# Patient Record
Sex: Male | Born: 1955 | Race: Black or African American | Hispanic: No | Marital: Single | Smoking: Former smoker
Health system: Southern US, Community
[De-identification: ages and names within clinical notes are randomized; demographics above are authoritative.]

## PROBLEM LIST (undated history)

## (undated) DIAGNOSIS — I10 Essential (primary) hypertension: Secondary | ICD-10-CM

## (undated) DIAGNOSIS — I639 Cerebral infarction, unspecified: Secondary | ICD-10-CM

## (undated) HISTORY — DX: Essential (primary) hypertension: I10

## (undated) HISTORY — DX: Cerebral infarction, unspecified: I63.9

---

## 2020-09-07 DIAGNOSIS — Z87891 Personal history of nicotine dependence: Secondary | ICD-10-CM | POA: Diagnosis not present

## 2020-09-07 DIAGNOSIS — I739 Peripheral vascular disease, unspecified: Secondary | ICD-10-CM | POA: Diagnosis not present

## 2020-09-07 DIAGNOSIS — E669 Obesity, unspecified: Secondary | ICD-10-CM | POA: Diagnosis not present

## 2020-09-07 DIAGNOSIS — Z6834 Body mass index (BMI) 34.0-34.9, adult: Secondary | ICD-10-CM | POA: Diagnosis not present

## 2020-09-07 DIAGNOSIS — R030 Elevated blood-pressure reading, without diagnosis of hypertension: Secondary | ICD-10-CM | POA: Diagnosis not present

## 2020-09-07 DIAGNOSIS — K219 Gastro-esophageal reflux disease without esophagitis: Secondary | ICD-10-CM | POA: Diagnosis not present

## 2020-10-08 DIAGNOSIS — E668 Other obesity: Secondary | ICD-10-CM | POA: Diagnosis not present

## 2020-10-08 DIAGNOSIS — R9431 Abnormal electrocardiogram [ECG] [EKG]: Secondary | ICD-10-CM | POA: Diagnosis not present

## 2020-10-08 DIAGNOSIS — I10 Essential (primary) hypertension: Secondary | ICD-10-CM | POA: Diagnosis not present

## 2020-10-08 DIAGNOSIS — I739 Peripheral vascular disease, unspecified: Secondary | ICD-10-CM | POA: Diagnosis not present

## 2020-10-16 DIAGNOSIS — I739 Peripheral vascular disease, unspecified: Secondary | ICD-10-CM | POA: Diagnosis not present

## 2020-10-19 DIAGNOSIS — I10 Essential (primary) hypertension: Secondary | ICD-10-CM | POA: Diagnosis not present

## 2020-12-03 ENCOUNTER — Emergency Department: Payer: Medicare HMO

## 2020-12-03 ENCOUNTER — Inpatient Hospital Stay (HOSPITAL_COMMUNITY): Payer: Medicare HMO

## 2020-12-03 ENCOUNTER — Inpatient Hospital Stay (HOSPITAL_COMMUNITY): Payer: Medicare HMO | Admitting: Anesthesiology

## 2020-12-03 ENCOUNTER — Other Ambulatory Visit: Payer: Self-pay

## 2020-12-03 ENCOUNTER — Ambulatory Visit (HOSPITAL_COMMUNITY)
Admission: RE | Admit: 2020-12-03 | Discharge: 2020-12-03 | Disposition: A | Discharge status: Home / Self Care | Payer: Medicare HMO | Source: Ambulatory Visit | Attending: Interventional Radiology | Admitting: Interventional Radiology

## 2020-12-03 ENCOUNTER — Encounter (HOSPITAL_COMMUNITY)
Admission: AD | Disposition: A | Discharge status: Home / Self Care | Payer: Self-pay | Source: Other Acute Inpatient Hospital | Attending: Neurology

## 2020-12-03 ENCOUNTER — Emergency Department
Admission: EM | Admit: 2020-12-03 | Discharge: 2020-12-03 | Disposition: A | Discharge status: Other Acute Inpatient Hospital | Payer: Medicare HMO | Attending: Emergency Medicine | Admitting: Emergency Medicine

## 2020-12-03 ENCOUNTER — Encounter: Payer: Self-pay | Admitting: Radiology

## 2020-12-03 ENCOUNTER — Inpatient Hospital Stay (HOSPITAL_COMMUNITY)
Admission: AD | Admit: 2020-12-03 | Discharge: 2020-12-06 | DRG: 023 | Disposition: A | Discharge status: Home / Self Care | Payer: Medicare HMO | Source: Other Acute Inpatient Hospital | Attending: Neurology | Admitting: Neurology

## 2020-12-03 ENCOUNTER — Other Ambulatory Visit (HOSPITAL_COMMUNITY): Payer: Self-pay | Admitting: Interventional Radiology

## 2020-12-03 DIAGNOSIS — G8191 Hemiplegia, unspecified affecting right dominant side: Secondary | ICD-10-CM | POA: Diagnosis not present

## 2020-12-03 DIAGNOSIS — R739 Hyperglycemia, unspecified: Secondary | ICD-10-CM | POA: Diagnosis present

## 2020-12-03 DIAGNOSIS — Z978 Presence of other specified devices: Secondary | ICD-10-CM | POA: Diagnosis not present

## 2020-12-03 DIAGNOSIS — J9601 Acute respiratory failure with hypoxia: Secondary | ICD-10-CM | POA: Diagnosis not present

## 2020-12-03 DIAGNOSIS — E781 Pure hyperglyceridemia: Secondary | ICD-10-CM | POA: Diagnosis not present

## 2020-12-03 DIAGNOSIS — Z6830 Body mass index (BMI) 30.0-30.9, adult: Secondary | ICD-10-CM

## 2020-12-03 DIAGNOSIS — I679 Cerebrovascular disease, unspecified: Secondary | ICD-10-CM | POA: Diagnosis not present

## 2020-12-03 DIAGNOSIS — Z79899 Other long term (current) drug therapy: Secondary | ICD-10-CM | POA: Diagnosis not present

## 2020-12-03 DIAGNOSIS — R531 Weakness: Secondary | ICD-10-CM | POA: Diagnosis present

## 2020-12-03 DIAGNOSIS — Z9282 Status post administration of tPA (rtPA) in a different facility within the last 24 hours prior to admission to current facility: Secondary | ICD-10-CM | POA: Diagnosis not present

## 2020-12-03 DIAGNOSIS — J690 Pneumonitis due to inhalation of food and vomit: Secondary | ICD-10-CM | POA: Diagnosis not present

## 2020-12-03 DIAGNOSIS — Z8673 Personal history of transient ischemic attack (TIA), and cerebral infarction without residual deficits: Secondary | ICD-10-CM

## 2020-12-03 DIAGNOSIS — E876 Hypokalemia: Secondary | ICD-10-CM | POA: Diagnosis present

## 2020-12-03 DIAGNOSIS — R4701 Aphasia: Secondary | ICD-10-CM | POA: Diagnosis present

## 2020-12-03 DIAGNOSIS — I6389 Other cerebral infarction: Secondary | ICD-10-CM

## 2020-12-03 DIAGNOSIS — I639 Cerebral infarction, unspecified: Secondary | ICD-10-CM | POA: Diagnosis not present

## 2020-12-03 DIAGNOSIS — Z20822 Contact with and (suspected) exposure to covid-19: Secondary | ICD-10-CM | POA: Insufficient documentation

## 2020-12-03 DIAGNOSIS — E669 Obesity, unspecified: Secondary | ICD-10-CM | POA: Diagnosis present

## 2020-12-03 DIAGNOSIS — R451 Restlessness and agitation: Secondary | ICD-10-CM | POA: Diagnosis present

## 2020-12-03 DIAGNOSIS — I6501 Occlusion and stenosis of right vertebral artery: Secondary | ICD-10-CM | POA: Diagnosis not present

## 2020-12-03 DIAGNOSIS — Z9889 Other specified postprocedural states: Secondary | ICD-10-CM | POA: Diagnosis not present

## 2020-12-03 DIAGNOSIS — R29736 NIHSS score 36: Secondary | ICD-10-CM | POA: Diagnosis present

## 2020-12-03 DIAGNOSIS — R000 Tachycardia, unspecified: Secondary | ICD-10-CM | POA: Diagnosis not present

## 2020-12-03 DIAGNOSIS — R0603 Acute respiratory distress: Secondary | ICD-10-CM | POA: Diagnosis present

## 2020-12-03 DIAGNOSIS — R4020 Unspecified coma: Secondary | ICD-10-CM | POA: Diagnosis present

## 2020-12-03 DIAGNOSIS — Z87891 Personal history of nicotine dependence: Secondary | ICD-10-CM | POA: Diagnosis not present

## 2020-12-03 DIAGNOSIS — R29810 Facial weakness: Secondary | ICD-10-CM | POA: Diagnosis not present

## 2020-12-03 DIAGNOSIS — R471 Dysarthria and anarthria: Secondary | ICD-10-CM | POA: Diagnosis present

## 2020-12-03 DIAGNOSIS — Z4659 Encounter for fitting and adjustment of other gastrointestinal appliance and device: Secondary | ICD-10-CM

## 2020-12-03 DIAGNOSIS — I651 Occlusion and stenosis of basilar artery: Secondary | ICD-10-CM | POA: Diagnosis not present

## 2020-12-03 DIAGNOSIS — I63111 Cerebral infarction due to embolism of right vertebral artery: Secondary | ICD-10-CM | POA: Diagnosis not present

## 2020-12-03 DIAGNOSIS — R42 Dizziness and giddiness: Secondary | ICD-10-CM | POA: Diagnosis not present

## 2020-12-03 DIAGNOSIS — Z4682 Encounter for fitting and adjustment of non-vascular catheter: Secondary | ICD-10-CM | POA: Diagnosis not present

## 2020-12-03 DIAGNOSIS — R1310 Dysphagia, unspecified: Secondary | ICD-10-CM | POA: Diagnosis present

## 2020-12-03 DIAGNOSIS — I6322 Cerebral infarction due to unspecified occlusion or stenosis of basilar arteries: Secondary | ICD-10-CM | POA: Diagnosis not present

## 2020-12-03 DIAGNOSIS — I10 Essential (primary) hypertension: Secondary | ICD-10-CM | POA: Diagnosis not present

## 2020-12-03 DIAGNOSIS — I6312 Cerebral infarction due to embolism of basilar artery: Principal | ICD-10-CM | POA: Diagnosis present

## 2020-12-03 DIAGNOSIS — I6302 Cerebral infarction due to thrombosis of basilar artery: Secondary | ICD-10-CM | POA: Diagnosis not present

## 2020-12-03 DIAGNOSIS — R1312 Dysphagia, oropharyngeal phase: Secondary | ICD-10-CM | POA: Diagnosis not present

## 2020-12-03 DIAGNOSIS — E785 Hyperlipidemia, unspecified: Secondary | ICD-10-CM | POA: Diagnosis present

## 2020-12-03 DIAGNOSIS — E1159 Type 2 diabetes mellitus with other circulatory complications: Secondary | ICD-10-CM | POA: Diagnosis not present

## 2020-12-03 DIAGNOSIS — I6340 Cerebral infarction due to embolism of unspecified cerebral artery: Secondary | ICD-10-CM | POA: Diagnosis present

## 2020-12-03 DIAGNOSIS — I6523 Occlusion and stenosis of bilateral carotid arteries: Secondary | ICD-10-CM | POA: Diagnosis not present

## 2020-12-03 DIAGNOSIS — J9690 Respiratory failure, unspecified, unspecified whether with hypoxia or hypercapnia: Secondary | ICD-10-CM | POA: Diagnosis not present

## 2020-12-03 DIAGNOSIS — I161 Hypertensive emergency: Secondary | ICD-10-CM | POA: Diagnosis not present

## 2020-12-03 DIAGNOSIS — R4781 Slurred speech: Secondary | ICD-10-CM | POA: Diagnosis not present

## 2020-12-03 DIAGNOSIS — I160 Hypertensive urgency: Secondary | ICD-10-CM | POA: Diagnosis not present

## 2020-12-03 DIAGNOSIS — I152 Hypertension secondary to endocrine disorders: Secondary | ICD-10-CM | POA: Diagnosis not present

## 2020-12-03 DIAGNOSIS — E7800 Pure hypercholesterolemia, unspecified: Secondary | ICD-10-CM | POA: Diagnosis not present

## 2020-12-03 HISTORY — PX: IR INTRA CRAN STENT: IMG2345

## 2020-12-03 HISTORY — PX: IR PERCUTANEOUS ART THROMBECTOMY/INFUSION INTRACRANIAL INC DIAG ANGIO: IMG6087

## 2020-12-03 HISTORY — PX: RADIOLOGY WITH ANESTHESIA: SHX6223

## 2020-12-03 HISTORY — PX: IR US GUIDE VASC ACCESS RIGHT: IMG2390

## 2020-12-03 HISTORY — PX: IR CT HEAD LTD: IMG2386

## 2020-12-03 LAB — CBC
HCT: 40.9 % (ref 39.0–52.0)
Hemoglobin: 13.4 g/dL (ref 13.0–17.0)
MCH: 28.5 pg (ref 26.0–34.0)
MCHC: 32.8 g/dL (ref 30.0–36.0)
MCV: 86.8 fL (ref 80.0–100.0)
Platelets: 298 10*3/uL (ref 150–400)
RBC: 4.71 MIL/uL (ref 4.22–5.81)
RDW: 13.8 % (ref 11.5–15.5)
WBC: 13.5 10*3/uL — ABNORMAL HIGH (ref 4.0–10.5)
nRBC: 0 % (ref 0.0–0.2)

## 2020-12-03 LAB — COMPREHENSIVE METABOLIC PANEL
ALT: 25 U/L (ref 0–44)
AST: 30 U/L (ref 15–41)
Albumin: 3.9 g/dL (ref 3.5–5.0)
Alkaline Phosphatase: 72 U/L (ref 38–126)
Anion gap: 12 (ref 5–15)
BUN: 13 mg/dL (ref 8–23)
CO2: 26 mmol/L (ref 22–32)
Calcium: 8.9 mg/dL (ref 8.9–10.3)
Chloride: 95 mmol/L — ABNORMAL LOW (ref 98–111)
Creatinine, Ser: 1.1 mg/dL (ref 0.61–1.24)
GFR, Estimated: >60 mL/min (ref >60)
Glucose, Bld: 203 mg/dL — ABNORMAL HIGH (ref 70–99)
Potassium: 2.6 mmol/L — CL (ref 3.5–5.1)
Sodium: 133 mmol/L — ABNORMAL LOW (ref 135–145)
Total Bilirubin: 0.6 mg/dL (ref 0.3–1.2)
Total Protein: 7.3 g/dL (ref 6.5–8.1)

## 2020-12-03 LAB — PROTIME-INR
INR: 1 (ref 0.8–1.2)
INR: 1 (ref 0.8–1.2)
Prothrombin Time: 13 seconds (ref 11.4–15.2)
Prothrombin Time: 13.4 seconds (ref 11.4–15.2)

## 2020-12-03 LAB — URINALYSIS, COMPLETE (UACMP) WITH MICROSCOPIC
Bacteria, UA: NONE SEEN
Bilirubin Urine: NEGATIVE
Glucose, UA: NEGATIVE mg/dL
Hgb urine dipstick: NEGATIVE
Ketones, ur: NEGATIVE mg/dL
Leukocytes,Ua: NEGATIVE
Nitrite: NEGATIVE
Protein, ur: NEGATIVE mg/dL
Specific Gravity, Urine: 1.041 — ABNORMAL HIGH (ref 1.005–1.030)
pH: 8 (ref 5.0–8.0)

## 2020-12-03 LAB — POCT I-STAT 7, (LYTES, BLD GAS, ICA,H+H)
Acid-Base Excess: 1 mmol/L (ref 0.0–2.0)
Bicarbonate: 25.7 mmol/L (ref 20.0–28.0)
Calcium, Ion: 1.16 mmol/L (ref 1.15–1.40)
HCT: 41 % (ref 39.0–52.0)
Hemoglobin: 13.9 g/dL (ref 13.0–17.0)
O2 Saturation: 89 %
Patient temperature: 97
Potassium: 3.1 mmol/L — ABNORMAL LOW (ref 3.5–5.1)
Sodium: 135 mmol/L (ref 135–145)
TCO2: 27 mmol/L (ref 22–32)
pCO2 arterial: 38.8 mmHg (ref 32.0–48.0)
pH, Arterial: 7.426 (ref 7.350–7.450)
pO2, Arterial: 53 mmHg — ABNORMAL LOW (ref 83.0–108.0)

## 2020-12-03 LAB — RESP PANEL BY RT-PCR (FLU A&B, COVID) ARPGX2
Influenza A by PCR: NEGATIVE
Influenza B by PCR: NEGATIVE
SARS Coronavirus 2 by RT PCR: NEGATIVE

## 2020-12-03 LAB — DIFFERENTIAL
Abs Immature Granulocytes: 0.04 10*3/uL (ref 0.00–0.07)
Basophils Absolute: 0.1 10*3/uL (ref 0.0–0.1)
Basophils Relative: 1 %
Eosinophils Absolute: 0.1 10*3/uL (ref 0.0–0.5)
Eosinophils Relative: 0 %
Immature Granulocytes: 0 %
Lymphocytes Relative: 28 %
Lymphs Abs: 3.7 10*3/uL (ref 0.7–4.0)
Monocytes Absolute: 0.8 10*3/uL (ref 0.1–1.0)
Monocytes Relative: 6 %
Neutro Abs: 8.8 10*3/uL — ABNORMAL HIGH (ref 1.7–7.7)
Neutrophils Relative %: 65 %

## 2020-12-03 LAB — MRSA NEXT GEN BY PCR, NASAL: MRSA by PCR Next Gen: NOT DETECTED

## 2020-12-03 LAB — GLUCOSE, CAPILLARY
Glucose-Capillary: 121 mg/dL — ABNORMAL HIGH (ref 70–99)
Glucose-Capillary: 197 mg/dL — ABNORMAL HIGH (ref 70–99)
Glucose-Capillary: 117 mg/dL — ABNORMAL HIGH (ref 70–99)

## 2020-12-03 LAB — BASIC METABOLIC PANEL
Anion gap: 12 (ref 5–15)
BUN: 11 mg/dL (ref 8–23)
CO2: 25 mmol/L (ref 22–32)
Calcium: 8.5 mg/dL — ABNORMAL LOW (ref 8.9–10.3)
Chloride: 95 mmol/L — ABNORMAL LOW (ref 98–111)
Creatinine, Ser: 0.79 mg/dL (ref 0.61–1.24)
GFR, Estimated: >60 mL/min (ref >60)
Glucose, Bld: 115 mg/dL — ABNORMAL HIGH (ref 70–99)
Potassium: 3.9 mmol/L (ref 3.5–5.1)
Sodium: 132 mmol/L — ABNORMAL LOW (ref 135–145)

## 2020-12-03 LAB — RAPID URINE DRUG SCREEN, HOSP PERFORMED
Amphetamines: NOT DETECTED
Barbiturates: NOT DETECTED
Benzodiazepines: NOT DETECTED
Cocaine: NOT DETECTED
Opiates: NOT DETECTED
Tetrahydrocannabinol: POSITIVE — AB

## 2020-12-03 LAB — PHOSPHORUS: Phosphorus: 2.6 mg/dL (ref 2.5–4.6)

## 2020-12-03 LAB — APTT
aPTT: 20 seconds — ABNORMAL LOW (ref 24–36)
aPTT: 29 seconds (ref 24–36)

## 2020-12-03 LAB — MAGNESIUM: Magnesium: 2.1 mg/dL (ref 1.7–2.4)

## 2020-12-03 LAB — HIV ANTIBODY (ROUTINE TESTING W REFLEX): HIV Screen 4th Generation wRfx: NONREACTIVE

## 2020-12-03 LAB — CBG MONITORING, ED: Glucose-Capillary: 233 mg/dL — ABNORMAL HIGH (ref 70–99)

## 2020-12-03 LAB — ETHANOL: Alcohol, Ethyl (B): <10 mg/dL (ref <10)

## 2020-12-03 SURGERY — IR WITH ANESTHESIA
Anesthesia: General

## 2020-12-03 MED ORDER — CANGRELOR TETRASODIUM 50 MG IV SOLR
INTRAVENOUS | Status: AC
  Filled 2020-12-03: qty 50

## 2020-12-03 MED ORDER — TICAGRELOR 90 MG PO TABS: 90.0000 mg | ORAL_TABLET | Freq: Two times a day (BID) | ORAL | Status: DC

## 2020-12-03 MED ORDER — IOHEXOL 350 MG/ML SOLN
75.0000 mL | Freq: Once | INTRAVENOUS | Status: AC | PRN
  Administered 2020-12-03: 75 mL via INTRAVENOUS

## 2020-12-03 MED ORDER — CLEVIDIPINE BUTYRATE 0.5 MG/ML IV EMUL
INTRAVENOUS | Status: AC
  Administered 2020-12-03: 16 mg/h via INTRAVENOUS
  Filled 2020-12-03: qty 50

## 2020-12-03 MED ORDER — MIDAZOLAM HCL 2 MG/2ML IJ SOLN
INTRAMUSCULAR | Status: AC
  Administered 2020-12-03: 2 mg via INTRAVENOUS
  Filled 2020-12-03: qty 2

## 2020-12-03 MED ORDER — MAGNESIUM SULFATE 2 GM/50ML IV SOLN
2.0000 g | Freq: Once | INTRAVENOUS | Status: AC
  Administered 2020-12-03: 2 g via INTRAVENOUS
  Filled 2020-12-03: qty 50

## 2020-12-03 MED ORDER — VECURONIUM BROMIDE 10 MG IV SOLR: 10.0000 mg | Freq: Once | INTRAVENOUS | Status: AC

## 2020-12-03 MED ORDER — IOHEXOL 350 MG/ML SOLN
100.0000 mL | Freq: Once | INTRAVENOUS | Status: AC | PRN
  Administered 2020-12-03: 75 mL via INTRA_ARTERIAL

## 2020-12-03 MED ORDER — MIDAZOLAM HCL 2 MG/2ML IJ SOLN: 2.0000 mg | Freq: Once | INTRAMUSCULAR | Status: AC

## 2020-12-03 MED ORDER — PIPERACILLIN-TAZOBACTAM 3.375 G IVPB: 3.3750 g | Freq: Three times a day (TID) | INTRAVENOUS | Status: DC

## 2020-12-03 MED ORDER — SUCCINYLCHOLINE CHLORIDE 200 MG/10ML IV SOSY
PREFILLED_SYRINGE | INTRAVENOUS | Status: AC
  Administered 2020-12-03: 200 mg via INTRAVENOUS
  Filled 2020-12-03: qty 10

## 2020-12-03 MED ORDER — TICAGRELOR 90 MG PO TABS: 180.0000 mg | ORAL_TABLET | Freq: Two times a day (BID) | ORAL | Status: DC

## 2020-12-03 MED ORDER — VECURONIUM BROMIDE 10 MG IV SOLR
INTRAVENOUS | Status: AC
  Administered 2020-12-03: 10 mg via INTRAVENOUS
  Filled 2020-12-03: qty 10

## 2020-12-03 MED ORDER — PROPOFOL 1000 MG/100ML IV EMUL
5.0000 ug/kg/min | INTRAVENOUS | Status: DC
  Administered 2020-12-03: 20 ug/kg/min via INTRAVENOUS
  Filled 2020-12-03: qty 100

## 2020-12-03 MED ORDER — CLEVIDIPINE BUTYRATE 0.5 MG/ML IV EMUL: 0.0000 mg/h | INTRAVENOUS | Status: DC

## 2020-12-03 MED ORDER — ACETAMINOPHEN 160 MG/5ML PO SOLN
650.0000 mg | ORAL | Status: DC | PRN
Start: 2020-12-03 — End: 2020-12-06

## 2020-12-03 MED ORDER — POTASSIUM CHLORIDE 10 MEQ/100ML IV SOLN
INTRAVENOUS | Status: DC | PRN
  Administered 2020-12-03 (×2): 10 meq via INTRAVENOUS

## 2020-12-03 MED ORDER — POTASSIUM CHLORIDE 10 MEQ/100ML IV SOLN
10.0000 meq | Freq: Once | INTRAVENOUS | Status: DC
  Filled 2020-12-03: qty 100

## 2020-12-03 MED ORDER — ETOMIDATE 2 MG/ML IV SOLN: 30.0000 mg | Freq: Once | INTRAVENOUS | Status: AC

## 2020-12-03 MED ORDER — ASPIRIN 81 MG PO CHEW: 81.0000 mg | CHEWABLE_TABLET | Freq: Every day | ORAL | Status: DC

## 2020-12-03 MED ORDER — ROCURONIUM BROMIDE 100 MG/10ML IV SOLN
INTRAVENOUS | Status: DC | PRN
Start: 2020-12-03 — End: 2020-12-03
  Administered 2020-12-03 (×2): 50 mg via INTRAVENOUS

## 2020-12-03 MED ORDER — MIDAZOLAM HCL 2 MG/2ML IJ SOLN
INTRAMUSCULAR | Status: AC
  Filled 2020-12-03: qty 2

## 2020-12-03 MED ORDER — PIPERACILLIN-TAZOBACTAM 3.375 G IVPB
3.3750 g | Freq: Three times a day (TID) | INTRAVENOUS | Status: DC
  Administered 2020-12-03 – 2020-12-04 (×2): 3.375 g via INTRAVENOUS
  Filled 2020-12-03 (×2): qty 50

## 2020-12-03 MED ORDER — SUCCINYLCHOLINE CHLORIDE 200 MG/10ML IV SOSY: 200.0000 mg | PREFILLED_SYRINGE | Freq: Once | INTRAVENOUS | Status: AC

## 2020-12-03 MED ORDER — PROPOFOL 1000 MG/100ML IV EMUL
5.0000 ug/kg/min | INTRAVENOUS | Status: DC
  Administered 2020-12-03 (×3): 80 ug/kg/min via INTRAVENOUS
  Administered 2020-12-04: 20 ug/kg/min via INTRAVENOUS
  Administered 2020-12-04 (×2): 80 ug/kg/min via INTRAVENOUS
  Filled 2020-12-03 (×7): qty 100

## 2020-12-03 MED ORDER — POLYETHYLENE GLYCOL 3350 17 G PO PACK: 17.0000 g | PACK | Freq: Every day | ORAL | Status: DC

## 2020-12-03 MED ORDER — CLEVIDIPINE BUTYRATE 0.5 MG/ML IV EMUL
0.0000 mg/h | INTRAVENOUS | Status: DC
  Administered 2020-12-03 (×2): 18 mg/h via INTRAVENOUS
  Administered 2020-12-03: 16 mg/h via INTRAVENOUS
  Administered 2020-12-03: 21 mg/h via INTRAVENOUS
  Administered 2020-12-04: 10 mg/h via INTRAVENOUS
  Administered 2020-12-04: 16 mg/h via INTRAVENOUS
  Filled 2020-12-03: qty 50
  Filled 2020-12-03 (×3): qty 100
  Filled 2020-12-03 (×2): qty 50

## 2020-12-03 MED ORDER — INSULIN ASPART 100 UNIT/ML IJ SOLN
0.0000 [IU] | INTRAMUSCULAR | Status: DC
  Administered 2020-12-03 – 2020-12-05 (×7): 2 [IU] via SUBCUTANEOUS
  Administered 2020-12-05: 1 [IU] via SUBCUTANEOUS
  Administered 2020-12-05 (×3): 2 [IU] via SUBCUTANEOUS
  Administered 2020-12-06 (×3): 1 [IU] via SUBCUTANEOUS

## 2020-12-03 MED ORDER — FENTANYL CITRATE PF 50 MCG/ML IJ SOSY
25.0000 ug | PREFILLED_SYRINGE | INTRAMUSCULAR | Status: DC | PRN
  Administered 2020-12-03 – 2020-12-04 (×3): 100 ug via INTRAVENOUS
  Filled 2020-12-03 (×3): qty 2

## 2020-12-03 MED ORDER — POTASSIUM CHLORIDE 10 MEQ/100ML IV SOLN
10.0000 meq | INTRAVENOUS | Status: AC
Start: 2020-12-03 — End: 2020-12-04
  Administered 2020-12-03 (×4): 10 meq via INTRAVENOUS
  Filled 2020-12-03: qty 100

## 2020-12-03 MED ORDER — TICAGRELOR 90 MG PO TABS: 180.0000 mg | ORAL_TABLET | ORAL | Status: DC

## 2020-12-03 MED ORDER — PROPOFOL 10 MG/ML IV BOLUS
INTRAVENOUS | Status: DC | PRN
  Administered 2020-12-03: 75 ug/kg/min via INTRAVENOUS

## 2020-12-03 MED ORDER — PANTOPRAZOLE SODIUM 40 MG IV SOLR
40.0000 mg | Freq: Every day | INTRAVENOUS | Status: DC
  Administered 2020-12-04 – 2020-12-05 (×2): 40 mg via INTRAVENOUS
  Filled 2020-12-03 (×2): qty 40

## 2020-12-03 MED ORDER — ASPIRIN 300 MG RE SUPP
600.0000 mg | Freq: Once | RECTAL | Status: AC
  Administered 2020-12-03: 600 mg via RECTAL
  Filled 2020-12-03: qty 2

## 2020-12-03 MED ORDER — FENTANYL CITRATE PF 50 MCG/ML IJ SOSY: 25.0000 ug | PREFILLED_SYRINGE | INTRAMUSCULAR | Status: DC | PRN

## 2020-12-03 MED ORDER — SODIUM CHLORIDE 0.9 % IV SOLN
INTRAVENOUS | Status: DC | PRN
  Administered 2020-12-03: 2 ug/kg/min via INTRAVENOUS

## 2020-12-03 MED ORDER — ASPIRIN 300 MG RE SUPP
600.0000 mg | RECTAL | Status: DC
  Filled 2020-12-03: qty 2

## 2020-12-03 MED ORDER — SENNOSIDES-DOCUSATE SODIUM 8.6-50 MG PO TABS: 1.0000 | ORAL_TABLET | Freq: Every evening | ORAL | Status: DC | PRN

## 2020-12-03 MED ORDER — SODIUM CHLORIDE 0.9 % IV SOLN: INTRAVENOUS | Status: DC

## 2020-12-03 MED ORDER — ETOMIDATE 2 MG/ML IV SOLN
INTRAVENOUS | Status: AC
  Administered 2020-12-03: 30 mg via INTRAVENOUS
  Filled 2020-12-03: qty 20

## 2020-12-03 MED ORDER — CLEVIDIPINE BUTYRATE 0.5 MG/ML IV EMUL
INTRAVENOUS | Status: AC
  Administered 2020-12-03: 4 mg/h via INTRAVENOUS
  Filled 2020-12-03: qty 50

## 2020-12-03 MED ORDER — CLEVIDIPINE BUTYRATE 0.5 MG/ML IV EMUL
INTRAVENOUS | Status: DC | PRN
  Administered 2020-12-03: 2 mg/h via INTRAVENOUS

## 2020-12-03 MED ORDER — ASPIRIN 325 MG PO TABS
ORAL_TABLET | ORAL | Status: AC
  Filled 2020-12-03: qty 2

## 2020-12-03 MED ORDER — TENECTEPLASE FOR STROKE
0.2500 mg/kg | PACK | Freq: Once | INTRAVENOUS | Status: DC
  Filled 2020-12-03: qty 5

## 2020-12-03 MED ORDER — MIDAZOLAM HCL 2 MG/2ML IJ SOLN
2.0000 mg | Freq: Once | INTRAMUSCULAR | Status: AC
  Administered 2020-12-03: 2 mg via INTRAVENOUS

## 2020-12-03 MED ORDER — TENECTEPLASE FOR STROKE
PACK | INTRAVENOUS | Status: AC
  Filled 2020-12-03: qty 10

## 2020-12-03 MED ORDER — STROKE: EARLY STAGES OF RECOVERY BOOK
Freq: Once | Status: AC
  Filled 2020-12-03: qty 1

## 2020-12-03 MED ORDER — LACTATED RINGERS IV SOLN: INTRAVENOUS | Status: DC | PRN

## 2020-12-03 MED ORDER — CANGRELOR BOLUS VIA INFUSION
INTRAVENOUS | Status: DC | PRN
  Administered 2020-12-03: 1497 ug via INTRAVENOUS

## 2020-12-03 MED ORDER — SODIUM CHLORIDE 0.9% FLUSH: 3.0000 mL | Freq: Once | INTRAVENOUS | Status: DC

## 2020-12-03 MED ORDER — ACETAMINOPHEN 325 MG PO TABS: 650.0000 mg | ORAL_TABLET | ORAL | Status: DC | PRN

## 2020-12-03 MED ORDER — ACETAMINOPHEN 650 MG RE SUPP: 650.0000 mg | RECTAL | Status: DC | PRN

## 2020-12-03 MED ORDER — FENTANYL CITRATE (PF) 100 MCG/2ML IJ SOLN
INTRAMUSCULAR | Status: DC | PRN
  Administered 2020-12-03 (×4): 50 ug via INTRAVENOUS

## 2020-12-03 MED ORDER — TENECTEPLASE FOR STROKE
0.2500 mg/kg | PACK | Freq: Once | INTRAVENOUS | Status: AC
  Administered 2020-12-03: 50 mg via INTRAVENOUS

## 2020-12-03 MED ORDER — DOCUSATE SODIUM 50 MG/5ML PO LIQD: 100.0000 mg | Freq: Two times a day (BID) | ORAL | Status: DC

## 2020-12-03 NOTE — Procedures (Addendum)
Neuro-Interventional Radiology  Post Cerebral Angiogram Procedure Note  Operator:    Dr. Loreta Ave Assistant:   None  History:   65 yo male presents to [facility] with right side deficits, concern for acute stroke.     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 Patient transferred for [facility] treatment, acute mechanical thrombectomy, possible stenting.   Baseline mRS:  0  Site of occlusion:  Mid-basilar   Procedure: US guided right CFA access Cervical & Cerebral Angiogram Mechanical Thrombectomy, basilar artery Rescue stenting of unstable plaque at the mid-basilar artery, 2.31mm x 35mm resolute onyx, balloon mounted stent, to 10ATM/2.52mm Deployment of Angioseal Flat Panel CT in NIR  First Pass Device:  Zoom 55 aspiration  Result   TICI 3, with reocclusion, prompting rescue stent  Findings:   Mid-basilar occlusion, initial TICI 2a  Final TICI after rescue stent, TICI 3.   Anesthesia:   GETA  EBL:    ~100cc     Complication:  None   Medication: IV tPA administered?: Initiation of kangrelor, with bolus and ggt IA Medication:  none  Recommendations: - remain intubated to neuro ICU - right hip straight x 4 hours - Goal SBP 120-140.   - Frequent NV checks - Repeat CT or MRI imaging recommended within 36 hours, discretion of Neurology - NIR to follow - kangrelor initiated at 4:06pm, continue kangrelor ggt until 6:06pm - rectal aspirin immediately, 600mg  in neuro-icu (OG failed in IR) - brilinta loading dose after completion of the kangrelor infusion - continue ASA & brilinta tomorrow - attempted calling the number available for the patients family/significant other, [phone].  No answer  Signed,  [phone]. Yvone Neu, DO

## 2020-12-03 NOTE — Progress Notes (Signed)
Tracheal aspirate collected and sent to lab. 

## 2020-12-03 NOTE — Transfer of Care (Signed)
Immediate Anesthesia Transfer of Care Note  Patient: Travis Williams  Procedure(s) Performed: IR WITH ANESTHESIA  Patient Location: ICU  Anesthesia Type:General  Level of Consciousness: sedated and Patient remains intubated per anesthesia plan  Airway & Oxygen Therapy: Patient remains intubated per anesthesia plan and Patient placed on Ventilator (see vital sign flow sheet for setting)  Post-op Assessment: Report given to RN and Post -op Vital signs reviewed and stable  Post vital signs: Reviewed and stable  Last Vitals:  Vitals Value Taken Time  BP 144/103 12/03/20 1701  Temp    Pulse 100 12/03/20 1708  Resp 17 12/03/20 1706  SpO2 98 % 12/03/20 1708  Vitals shown include unvalidated device data.  Last Pain: There were no vitals filed for this visit.       Complications: No notable events documented.   Transported from IR to 4N with RRT managing vent. 100% fi02.  Tolerated transport well.  VSS for transport.

## 2020-12-03 NOTE — Anesthesia Procedure Notes (Addendum)
Arterial Line Insertion Start/End9/15/2022 3:07 PM, 12/03/2020 3:15 PM Performed by: Tillman Abide, CRNA, CRNA  Patient location: OR. Preanesthetic checklist: patient identified, IV checked, site marked, risks and benefits discussed, surgical consent, monitors and equipment checked, pre-op evaluation, timeout performed and anesthesia consent Left, radial was placed Catheter size: 20 G Hand hygiene performed , maximum sterile barriers used  and Seldinger technique used Allen's test indicative of satisfactory collateral circulation Attempts: 1 Procedure performed without using ultrasound guided technique. Following insertion, dressing applied and Biopatch. Post procedure assessment: unchanged  Patient tolerated the procedure well with no immediate complications.

## 2020-12-03 NOTE — ED Notes (Addendum)
[facility] at bedside to transport  Report to [facility]  Report given to Patty at [employer]

## 2020-12-03 NOTE — Progress Notes (Signed)
Pt transported from 4N28 to CT and back w/o complications. RT will cont to monitor.

## 2020-12-03 NOTE — ED Provider Notes (Signed)
[facility] Emergency Department Provider Note   ____________________________________________   Event Date/Time   First MD Initiated Contact with Patient 12/03/20 1309     (approximate)  I have reviewed the triage vital signs and the nursing notes.   HISTORY  Chief Complaint Code Stroke  EM caveat: Patient severe respiratory distress, unable to verbalize  HPI Travis Williams is a 65 y.o. male who presents for concerns of acute stroke.  Seen and evaluated by neurology who advises patient last seen well at about 10:30 AM, found down unresponsive with complete right-sided weakness.  Patient arrives with concerns for possible large vessel occlusion or left MCA stroke.  Patient given TN K.  Patient having worsening and developing respiratory distress since the time of ED arrival.  History reviewed. No pertinent past medical history.  Patient Active Problem List   Diagnosis Date Noted   Ischemic stroke (HCC) 12/03/2020       Prior to Admission medications   Medication Sig Start Date End Date Taking? Authorizing Provider  chlorthalidone (HYGROTON) 25 MG tablet Take 25 mg by mouth daily. 11/16/20  Yes [provider]    Allergies Patient has no known allergies.  No family history on file.  Social History    Review of Systems EM caveat   ____________________________________________   PHYSICAL EXAM:  VITAL SIGNS: ED Triage Vitals  Enc Vitals Group     BP 12/03/20 1343 (!) 162/106     Pulse Rate 12/03/20 1338 (!) 114     Resp 12/03/20 1338 (!) 25     Temp --      Temp src --      SpO2 12/03/20 1338 96 %     Weight 12/03/20 1343 216 lb 0.8 oz (98 kg)     Height 12/03/20 1343  (1.803 m)     Head Circumference --      Peak Flow --      Pain Score --      Pain Loc --      Pain Edu? --      Excl. in GC? --    Evaluated the patient on arrival to the ED bed within about 3 minutes of his arrival to bed #8 from CT  scan. Constitutional: Lethargic, sonorous respirations.  Occasionally purposeful using left hand.  Minimal if any use at all of the right-sided extremities. Eyes: Conjunctivae are normal. Head: Atraumatic. Nose: No congestion/rhinnorhea. Mouth/Throat: Mucous membranes are moist.  Some frothy sputum production. Neck: No stridor.  Cardiovascular: Tachycardic rate, regular rhythm. Grossly normal heart sounds.  Good peripheral circulation. Respiratory: Frequent shallow labored respirations.  Some frothy sputum production.  Notable rhonchi.  Patient is obviously having some respiratory distress seems to be poorly handling his airway secretions Gastrointestinal: Soft and nontender. No distention. Musculoskeletal: No lower extremity tenderness nor edema. Neurologic: Difficult to assess.  Does not appear to have good use of the right side occasionally intentional use the left hand, seems to deviate eyes towards the right skin:  Skin is warm, dry and intact. No rash noted. Psychiatric: Mood and affect are unable to assess ____________________________________________   LABS (all labs ordered are listed, but only abnormal results are displayed)  Labs Reviewed  CBC - Abnormal; Notable for the following components:      Result Value   WBC 13.5 (*)    All other components within normal limits  DIFFERENTIAL - Abnormal; Notable for the following components:   Neutro Abs 8.8 (*)  All other components within normal limits  COMPREHENSIVE METABOLIC PANEL - Abnormal; Notable for the following components:   Sodium 133 (*)    Potassium 2.6 (*)    Chloride 95 (*)    Glucose, Bld 203 (*)    All other components within normal limits  APTT - Abnormal; Notable for the following components:   aPTT 20 (*)    All other components within normal limits  CBG MONITORING, ED - Abnormal; Notable for the following components:   Glucose-Capillary 233 (*)    All other components within normal limits  RESP PANEL BY  RT-PCR (FLU A&B, COVID) ARPGX2  PROTIME-INR  I-STAT CREATININE, ED   ____________________________________________  EKG  Reviewed inter by me at 1345 Heart rate 110 QRS 100 QTc 460 Sinus tachycardia, mild nonspecific T wave abnormality.  No obvious frank ischemia ____________________________________________  RADIOLOGY  CT of the head initial negative for acute hemorrhage    CT HEAD CODE STROKE WO CONTRAST  Addendum Date: 12/03/2020   ADDENDUM REPORT: 12/03/2020 14:11 ADDENDUM: Suspected small age-indeterminate infarct within the right cerebellar hemisphere (series 6, image 18) (series 5, images 50 and 51). These results were called by telephone at the time of interpretation on 12/03/2020 at 1:42 PM to provider Dr. Selina Cooley, who verbally acknowledged these results. Electronically Signed   By: Jackey Loge D.O.   On: 12/03/2020 14:11   Result Date: 12/03/2020 CLINICAL DATA:  Code stroke. Neuro deficit, acute, stroke suspected. Altered mental status. Last known well 10:30 a.m. EXAM: CT HEAD WITHOUT CONTRAST TECHNIQUE: Contiguous axial images were obtained from the base of the skull through the vertex without intravenous contrast. COMPARISON:  None FINDINGS: Brain: Mild generalized cerebral and cerebellar atrophy. Mild patchy and ill-defined hypoattenuation within the cerebral white matter, nonspecific but compatible with chronic small vessel ischemic disease. Age-indeterminate lacunar infarct within the central pons (series 3, image 10) (series 5, image 39) (series 6, image 28). There is no acute intracranial hemorrhage. No acute demarcated cortical infarct. No extra-axial fluid collection. No evidence of an intracranial mass. No midline shift. Vascular: No hyperdense vessel.  Atherosclerotic calcifications. Skull: Normal. Negative for fracture or focal lesion. Sinuses/Orbits: Visualized orbits show no acute finding. Trace scattered paranasal sinus mucosal thickening at the imaged levels. ASPECTS  (Alberta Stroke Program Early CT Score) - Ganglionic level infarction (caudate, lentiform nuclei, internal capsule, insula, M1-M3 cortex): 7 - Supraganglionic infarction (M4-M6 cortex): 3 Total score (0-10 with 10 being normal): 10 These results were called by telephone at the time of interpretation on 12/03/2020 at 1:19 pm to provider Dr. Selina Cooley, who verbally acknowledged these results. IMPRESSION: No evidence of acute intracranial hemorrhage or acute demarcated cortical infarction. Age-indeterminate lacunar infarct within the central pons. Mild chronic small-vessel ischemic changes within the cerebral white matter. Mild generalized parenchymal atrophy. Electronically Signed: By: Jackey Loge D.O. On: 12/03/2020 13:26   CT ANGIO HEAD NECK W WO CM (CODE STROKE)  Result Date: 12/03/2020 CLINICAL DATA:  Neuro deficit, acute, stroke suspected. Altered mental status. EXAM: CT ANGIOGRAPHY HEAD AND NECK TECHNIQUE: Multidetector CT imaging of the head and neck was performed using the standard protocol during bolus administration of intravenous contrast. Multiplanar CT image reconstructions and MIPs were obtained to evaluate the vascular anatomy. Carotid stenosis measurements (when applicable) are obtained utilizing NASCET criteria, using the distal internal carotid diameter as the denominator. CONTRAST:  54mL OMNIPAQUE IOHEXOL 350 MG/ML SOLN COMPARISON:  Noncontrast head CT performed earlier today 12/03/2020. FINDINGS: CTA NECK FINDINGS Aortic arch: Standard aortic  branching. Atherosclerotic plaque within the visualized aortic arch and proximal major branch vessels of the neck. Streak and beam hardening artifact arising from a dense right-sided contrast bolus partially obscures the right subclavian artery. Within this limitation, there is no hemodynamically significant innominate or proximal subclavian artery stenosis. Right carotid system: CCA and ICA patent within the neck. Soft and calcified plaque within the carotid  bifurcation and proximal ICA. Apparent high-grade (greater than 70% stenosis within the proximal ICA (series 6, image 173). However, beam hardening artifact and vessel tortuosity limit evaluation of the vessel lumen at this site, and it is suspected that artifact may significantly accentuate this apparent stenosis. Left carotid system: CCA and ICA patent within the neck. Soft and calcified plaque within the carotid bifurcation and proximal ICA. Calcified plaque results in less than 50% stenosis within the proximal ICA. Vertebral arteries: The right vertebral artery is non dominant. There is severe atherosclerotic stenosis at the origin of the right vertebral artery. The right vertebral artery appears occluded at the distal V2 segment level and at the level of the V2/V3 junction. There is reconstitution of this vessel intracranially, possibly due to retrograde flow. The dominant left vertebral artery is patent throughout the neck without stenosis. Skeleton: No acute bony abnormality or aggressive osseous lesion. Cervical spondylosis, advanced at C6-C7. Cervical levocurvature. Cervicothoracic dextrocurvature more inferiorly. Other neck: No neck mass or cervical lymphadenopathy. Upper chest: No consolidation within the imaged lung apices. Mild fluid distention of the mid to distal esophagus. Review of the MIP images confirms the above findings CTA HEAD FINDINGS Anterior circulation: The intracranial internal carotid arteries are patent. Calcified plaque within both vessels. Moderate stenosis within the cavernous internal carotid arteries bilaterally. The M1 middle cerebral arteries are patent. Atherosclerotic irregularity of the M2 and more distal middle cerebral artery vessels bilaterally. No M2 proximal branch occlusion is identified. The anterior cerebral arteries are patent. No intracranial aneurysm is identified. Posterior circulation: Reconstitution of the V4 right vertebral artery intracranially, which may be  due to retrograde flow. The dominant left vertebral artery is patent intracranially without stenosis. The basilar artery is patent proximally. However, there is a focal occlusion of the mid basilar artery measuring 7 mm in length. There is an apparent additional focal occlusion within the distal basilar artery. Enhancement is present within the basilar tip. The posterior cerebral arteries are patent. Both PCAs demonstrated multifocal atherosclerotic irregularity. Most notably, there is a severe stenosis at the origin of the left posterior cerebral artery, and a severe stenosis within the P2 right posterior cerebral artery. Posterior communicating arteries are hypoplastic or absent bilaterally. Venous sinuses: Within the limitations of contrast timing, no convincing thrombus. Anatomic variants: As described Review of the MIP images confirms the above findings Findings of right vertebral artery occlusion and multifocal basilar artery occlusion called by telephone at the time of interpretation on 12/03/2020 at 1:42 pm to provider [facility] , who verbally acknowledged these results. IMPRESSION: CTA neck: 1. Severe atherosclerotic stenosis at the origin of the non-dominant right vertebral artery. The vertebral artery appears occluded at the distal V2 segment level, and at the level of the V2/V3 junction. There is reconstitution of the right vertebral artery more distally, possibly due to retrograde flow. 2. The left vertebral artery is dominant and patent throughout the neck without stenosis. 3. The common carotid and internal carotid arteries are patent within the neck. There is apparent high-grade (greater than 70%) focal stenosis of the proximal right ICA. However, beam hardening artifact and  vessel tortuosity significantly limit evaluation of the vessel lumen at this site, and it is suspected that artifact may significantly accentuate this apparent stenosis. A carotid artery duplex examination is recommended for  further assessment. Atherosclerotic plaque within the left carotid bifurcation and proximal ICA results in less than 50% stenosis within the proximal left ICA. 4. Mild fluid distention of the visualized mid-to-distal esophagus. Aspiration precautions advised. CTA head: 1. 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 Enhancement is present at the basilar tip. 2. Enhancement is present within the V4 right vertebral artery, which may be due to retrograde flow (given occlusion of this vessel more proximally). 3. 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 4. Severe stenosis at the origin of the left posterior cerebral artery. 5. Severe stenosis within the P2 right posterior cerebral artery. 6. Moderate stenosis within the cavernous internal carotid arteries, bilaterally. Electronically Signed   By: Jackey Loge D.O.   On: 12/03/2020 14:10    CT of the head negative for acute infarct. ____________________________________________   PROCEDURES  Procedure(s) performed:  Intubation  Procedure Name: Intubation Date/Time: 12/03/2020 2:19 PM Performed by: Sharyn Creamer, MD Pre-anesthesia Checklist: Patient identified, Emergency Drugs available, Suction available, Patient being monitored and Timeout performed Oxygen Delivery Method: Non-rebreather mask Preoxygenation: Pre-oxygenation with 100% oxygen Induction Type: IV induction and Rapid sequence Ventilation: Mask ventilation without difficulty Laryngoscope Size: Glidescope and 4 Grade View: Grade I Tube type: Subglottic suction tube Tube size: 8.0 mm Number of attempts: 1 Airway Equipment and Method: Patient positioned with wedge pillow Placement Confirmation: ETT inserted through vocal cords under direct vision, CO2 detector and Breath sounds checked- equal and bilateral Secured at: 26 cm Tube secured with: ETT holder Dental Injury: Teeth and Oropharynx as per pre-operative assessment   Comments: No difficulty. Confirmed with ETCO2, breath sounds. CXR not completed prior to transfer (emergent nature, time sensitive LVO) and patient appropaite Sp02, ETCO2, and breath sounds plus direct visualization during intubation.      Critical Care performed: Yes, see critical care note(s)  CRITICAL CARE Performed by: Sharyn Creamer   Total critical care time: 40 minutes  Critical care time was exclusive of separately billable procedures and treating other patients.  Critical care was necessary to treat or prevent imminent or life-threatening deterioration.  Critical care was time spent personally by me on the following activities: development of treatment plan with patient and/or surrogate as well as nursing, discussions with consultants, evaluation of patient's response to treatment, examination of patient, obtaining history from patient or surrogate, ordering and performing treatments and interventions, ordering and review of laboratory studies, ordering and review of radiographic studies, pulse oximetry and re-evaluation of patient's condition.  ____________________________________________   INITIAL IMPRESSION / ASSESSMENT AND PLAN / ED COURSE  Pertinent labs & imaging results that were available during my care of the patient were reviewed by me and considered in my medical decision making (see chart for details).   Patient presents for acute onset of right-sided neurologic deficits.  Imaging findings concerning for large vessel occlusion, no evidence of intracranial hemorrhage.  Patient received TNKase.  Patient's mental status reportedly declining throughout his visit by the time he arrived to ED bed from CT scanner he is now having difficulty clearing secretions, increased work of breathing, rhonchorous lung sounds.  He appears to be aspirating.  The patient is not able to be consented for transfer or intubation, however emergent consent applies here.  Case and care has been  discussed with  Dr. Selina Cooley, she is the patient accepted in transfer to [facility] health excepting MD Dr. Marisue Humble with a current plan for direct to neuro interventional care  The patient blood pressure increasingly elevated, he did require intubation, propofol and Cleviprex being titrated to maintain systolic blood pressure goal of 140-180.  Elevated blood pressure just postintubation is denoted, rapid action being taken to lower this as well including propofol bolusing.  Patient given vecuronium for paralysis to facilitate transport, while remaining on propofol.  Report given directly to [facility] critical care team at the bedside by myself    Clinical Course as of 12/03/20 1533  Thu Dec 03, 2020  1317 I have not yet seen and assessed the patient as of this time.  Patient remains in CT scan.  Appears to be with teleneurology team via the EMS to direct CT scan process [MQ]  1318 Rn reports Dr. Selina Cooley is with patient in CT [MQ]  1320 Dr. Driscilla Moats L MCA stroke suspected. Start at 1030AM.  [MQ]  1321 Dr. Selina Cooley called, advises patient's symptoms appear consistent with left MCA stroke.  He is within the TNK window and she recommends and is ordering TN K.  Patient will further undergo CT angiography and CT perfusion at this time to evaluate for possible LVO [MQ]    Clinical Course User Index [MQ] Sharyn Creamer, MD    Patient emergently transferred to [facility] for concerns of large vessel occlusion.  Airway secured in the ER, blood pressure management undertaken aggressively.  Patient given paralytic prior to transfer to facilitate safe transfer, patient being continued on Cleviprex as well as propofol with critical care transport team.  Patient stable for emergent transfer ____________________________________________   FINAL CLINICAL IMPRESSION(S) / ED DIAGNOSES  Final diagnoses:  Large vessel stroke [facility])        Note:  This document was prepared using Dragon voice recognition  software and may include unintentional dictation errors       Sharyn Creamer, MD 12/03/20 1535

## 2020-12-03 NOTE — ED Notes (Signed)
200 mg of succ given  30 mg of Etimidate given  OPA in place  Respiratory at bedside

## 2020-12-03 NOTE — ED Notes (Signed)
2 bolus of 40 mg of propofol given

## 2020-12-03 NOTE — Plan of Care (Addendum)
Discussed with Dr. Loreta Ave, given cost/benefit of cangrelor Dr. Loreta Ave did not feel that the patient needed this medication overnight but that dual antiplatelet therapy could be resumed once p.o. access is acquired and aspirin alone will be sufficient to maintain stent patency overnight.  Brooke Dare MD-PhD [facility] [phone]

## 2020-12-03 NOTE — Consult Note (Signed)
NEUROLOGY CONSULTATION NOTE   Date of service: December 03, 2020 Patient Name: Travis Williams MRN:  [id number] DOB:  07/21/1955 Reason for consult: code stroke _ _ _   _ __   _ __ _ _  __ __   _ __   __ _  History of Present Illness   Travis Williams is a 65 y.o. male with PMH significant for HTN but no recent medical records available for review who was BIB EMS for R hemiplegia and slurred speech.  Last known well was 1030 this morning, per EMS patient was subsequently found partially fallen onto the floor around 1230 by his daughter in law at home.  We are unable to get in touch with any family at this time to corroborate this report.  When he initially came in he had a right sided hemiplegia and an expressive greater than receptive aphasia but at that time he was able to consent to [facility].  NIHSS = 16. CT head showed no acute intracranial process, ASPECTS 10.  To note of risks and benefits of TNKase was discussed with patient and he was able to confirm that he did want to proceed with informed consent.  He was also able to confirm that he did not meet any exclusion criteria and is not on anticoagulants.  TNKase was administered. 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 At this point patient decompensated from a history standpoint and was emergently intubated.  He was thus not able to consent to the procedure.  Case was discussed with Ruthy Dick of neuro IR.  Given patient's inability to consent himself, our inability to locate any family, and the fact that this basilar occlusion will be fatal if not intervened upon Dr. Loreta Ave and I agreed to take him under emergency protocol to IR for mechanical thrombectomy.   ROS   UTA 2/2 patient decompensation.   Past History   UTA 2/2 patient decompensation  Medications    Current Facility-Administered Medications:    clevidipine (CLEVIPREX) infusion 0.5 mg/mL, 0-21 mg/hr, Intravenous, Continuous, Jefferson Fuel, MD, Last  Rate: 8 mL/hr at 12/03/20 1420, 4 mg/hr at 12/03/20 1420   midazolam (VERSED) 2 MG/2ML injection, , , ,    propofol (DIPRIVAN) 1000 MG/100ML infusion, 5-80 mcg/kg/min, Intravenous, Continuous, Quale, Mark, MD, Last Rate: 23.5 mL/hr at 12/03/20 1425, 40 mcg/kg/min at 12/03/20 1425   sodium chloride flush (NS) 0.9 % injection 3 mL, 3 mL, Intravenous, Once, Sharyn Creamer, MD  Current Outpatient Medications:    chlorthalidone (HYGROTON) 25 MG tablet, Take 25 mg by mouth daily., Disp: , Rfl:      Vitals   Vitals:   12/03/20 1338 12/03/20 1343  BP:  (!) 162/106  Pulse: (!) 114 (!) 112  Resp: (!) 25 18  SpO2: 96% 98%  Weight:  98 kg  Height:  5\' 11"  (1.803 m)     Body mass index is 30.13 kg/m.  Physical Exam   Physical Exam Gen: alert, oriented to first name only, UTA further 2/2 expressive aphasia Resp: CTAB with normal WOB but quickly decompensated requiring emergent intubation  CV: RRR  Neuro: *MS: alert, oriented to first name only, UTA further 2/2 expressive aphasia *Speech: severe dysarthria, expressive >> receptive aphasia *CN:    I: Deferred   II,III: PERRLA, blinks to threat bilat   III,IV,VI: EOMI w/o nystagmus, no ptosis   V: Sensation intact from V1 to V3 to LT   VII: Eyelid closure was full.  R  UMN facial droop   VIII: Hearing intact to voice *Motor:   Drift in LUE and LLE but not down to bed. RUE no effort against gravity, RLE no movement. *Sensory: SILT *Coordination:  UTA *Reflexes:  2+ and symmetric throughout without clonus; toes down-going bilat *Gait: UTA  NIHSS  1a Level of Conscious.: 0 1b LOC Questions: 2 1c LOC Commands: 0 2 Best Gaze: 0 3 Visual: 0 4 Facial Palsy: 2 5a Motor Arm - left: 1 5b Motor Arm - Right: 3 6a Motor Leg - Left: 1 6b Motor Leg - Right: 4 7 Limb Ataxia: 0 8 Sensory: 0 9 Best Language: 1 10 Dysarthria: 2 11 Extinct. and Inatten.: 0  TOTAL: 16   Premorbid mRS = 1 estimated, but not able to confirm    Labs    CBC: No results for input(s): WBC, NEUTROABS, HGB, HCT, MCV, PLT in the last 168 hours.  Basic Metabolic Panel: No results found for: NA, K, CO2, GLUCOSE, BUN, CREATININE, CALCIUM, GFRNONAA, GFRAA, GLUCOSE Lipid Panel: No results found for: LDLCALC HgbA1c: No results found for: HGBA1C Urine Drug Screen: No results found for: LABOPIA, COCAINSCRNUR, LABBENZ, AMPHETMU, THCU, LABBARB  Alcohol Level No results found for: [facility]   Impression   Travis Williams is a 65 y.o. male with PMH significant for HTN but no recent medical records available for review who was BIB EMS for R hemiplegia and slurred speech.  LKW 1030. TNK administered with patient's informed consent but he then deteriorated and required intubation. CTA H&N showed multifocal basilar occlusion. D/w [facility] neuro IR and we agreed to take him to IR for emergent thrombectomy given patient's inability to consent, inability to find family, and the fact that occlusion will be fatal if not intervened upon.  Recommendations   - CODE IR activated, transfer to [facility] pending. Patient will go straight to IR and then be admitted to neuro ICU post procedure by Dr. Marisue Humble neurohospitalist - Goal SBP en route 140-180; patient currently intubated and sedated on propofol and on clevidipine gtt - Patient thrashing in bed and paralyzed with vecuronium for transfer, not patient may still be paralyzed upon arrival to [facility] ______________________________________________________________________   Thank you for the opportunity to take part in the care of this patient. If you have any further questions, please contact the neurology consultation attending.  Signed,  Bing Neighbors, MD [facility] [phone]  If 7pm- 7am, please page neurology on call as listed in AMION.

## 2020-12-03 NOTE — ED Notes (Signed)
Report to [facility] 

## 2020-12-03 NOTE — H&P (Signed)
Neurology Stroke H&P  Tabb Croghan MR# [id number] 12/03/2020  CC: acute embolic stroke  History is obtained from: Chart review as patient was intubated and sedated.  HPI: Travis Williams is a 65 y.o. male PMHx as reviewed below, HTN with acute multifocal large intracranial vessel occlusion s/p tNK transferred from OSH for thrombectomy. The patient was agitated and needed to be ventilated and high dose paralytic used in order for transport. On arrival he was still paralyzed and NIHSS was scored as coma.   The following information was taken from neurology consult note 12/03/2020  1:59 PM: "...no recent medical records available for review who was BIB EMS for R hemiplegia and slurred speech.  Last known well was 1030 this morning, per EMS patient was subsequently found partially fallen onto the floor around 1230 by his daughter in law at home.  We are unable to get in touch with any family at this time to corroborate this report.  When he initially came in he had a right sided hemiplegia and an expressive greater than receptive aphasia but at that time he was able to consent to [facility].  NIHSS = 16. CT head showed no acute intracranial process, ASPECTS 10.  To note of risks and benefits of TNKase was discussed with patient and he was able to confirm that he did want to proceed with informed consent.  He was also able to confirm that he did not meet any exclusion criteria and is not on anticoagulants.  TNKase was administered. 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 At this point patient decompensated from a history standpoint and was emergently intubated.  He was thus not able to consent to the procedure.  Case was discussed with Ruthy Dick of neuro IR.  Given patient's inability to consent himself, our inability to locate any family, and the fact that this basilar occlusion will be fatal if not intervened upon Dr. Loreta Ave and I agreed to take him under emergency protocol to IR for  mechanical thrombectomy."  Recommendations: "- CODE IR activated, transfer to [facility] pending. Patient will go straight to IR and then be admitted to neuro ICU post procedure by Dr. Marisue Humble neurohospitalist - Goal SBP en route 140-180; patient currently intubated and sedated on propofol and on clevidipine gtt - Patient thrashing in bed and paralyzed with vecuronium for transfer, not patient may still be paralyzed upon arrival to [facility]"   LKW: 1030 tNK given: yes IR Thrombectomy yes Modified Rankin Scale: 0-Completely asymptomatic and back to baseline post- stroke NIHSS: 36 Coma  ROS:  Unable to assess due to encephalopathy.  No past medical history on file.   No family history on file.  Social History:  has no history on file for tobacco use, alcohol use, and drug use.   Prior to Admission medications   Medication Sig Start Date End Date Taking? Authorizing Provider  chlorthalidone (HYGROTON) 25 MG tablet Take 25 mg by mouth daily. 11/16/20   [provider]   Exam: Current vital signs: There were no vitals taken for this visit.  Physical Exam  Constitutional: Appears well-developed and well-nourished.  Psych: unable to assess due to intubated, paralytic and sedated. Eyes: No scleral injection HENT: No OP obstruction. Head: Normocephalic.  Cardiovascular: Normal rate and regular rhythm.  Respiratory: intubated, symmetric excursions bilaterally, no audible wheezing. GI: Soft.  No distension.  Skin: WDI  Neuro: Mental Status: unable to assess due to intubated, paralytic and sedated. Speech unable to assess due to  intubated, paralytic and sedated. Visual Fields unable to assess due to intubated, paralytic and sedated. EOMI unable to assess due to intubated, paralytic and sedated. Face et tube in place.  Tone is normal. Bulk is normal.  No withdrawal to noxious. Deep Tendon Reflexes: Mute Toes Mute FNF and HKS aunable to assess due to intubated, paralytic  and sedated. Gait - Deferred  I have reviewed labs in epic and the pertinent results are:   Ref. Range 12/03/2020 13:45  Sodium Latest Ref Range: 135 - 145 mmol/L 133 (L)  Potassium Latest Ref Range: 3.5 - 5.1 mmol/L 2.6 (LL)  Chloride Latest Ref Range: 98 - 111 mmol/L 95 (L)  CO2 Latest Ref Range: 22 - 32 mmol/L 26  Glucose Latest Ref Range: 70 - 99 mg/dL 132 (H)   I have reviewed the images obtained: NCT head showed no evidence of acute intracranial hemorrhage or acute demarcated cortical infarction.  Age-indeterminate lacunar infarct within the central pons. Mild chronic small-vessel ischemic changes within the cerebral white matter.   CTA head and neck showed Multifocal occlusion of the mid-to-distal basilar artery. Enhancement is present at the basilar tip. 2. Enhancement is present within the V4 right vertebral artery, which may be due to retrograde flow (given occlusion of this vessel more proximally). 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 Severe stenosis at the origin of the left posterior cerebral artery. Severe stenosis within the P2 right posterior cerebral artery. Moderate stenosis within the cavernous internal carotid arteries, bilaterally.  Assessment: Travis Williams is a 65 y.o. male PMHx as above with multifocal basilar artery occlusion s/p mid-basilar TIKI3 thrombectomy. Discussed the case with discussed with Dr. Loreta Ave and pulmonary critical care. NeuroIR attempted to place OG tube however could not advance beyond diaphragm.     Plan: - Admit to NICU. - Lay patient flat. - Continue Cangrelor until 18:06 followed by DAPT:  - Rectal aspirin 600mg  x1 followed by 325mg  daily.  -  He will need to have Cangrelor restarted until po access can be established. - CT head in 6 hours post procedure. - MRI brain without contrast when stable. - Recommend TTE. - Recommend labs: HbA1c, lipid panel. - Recommend Statin if LDL  > 70 - SBP goal 120-140. - Telemetry monitoring for arrhythmia. - Recommend bedside Swallow screen. - Recommend Stroke education. - Recommend PT/OT/SLP consult. - Aspiration/fall/seizure precautions.   This patient is critically ill and at significant risk of neurological worsening, death and care requires constant monitoring of vital signs, hemodynamics,respiratory and cardiac monitoring, neurological assessment, discussion with family, other specialists and medical decision making of high complexity. I spent 73 minutes of neurocritical care time  in the care of  this patient. This was time spent independent of any time provided by nurse practitioner or PA.  Electronically signed by:  , MD Page: 12/03/2020, 3:51 PM

## 2020-12-03 NOTE — ED Triage Notes (Signed)
Pt activated as code stroke with R sided deficits (R sided arm drift). Per EMS last known well was 1030. Neurologist at EMS bay. CBG obtained with a reading in the 200's. Pt sent straight to CT after registration.

## 2020-12-03 NOTE — Consult Note (Signed)
NAME:  Travis Williams, MRN:  [id number], DOB:  03-01-1956, LOS: 0 ADMISSION DATE:  12/03/2020, CONSULTATION DATE:  12/03/20 REFERRING MD:  Stroke CHIEF COMPLAINT:  Vent Management   History of Present Illness:  Travis Williams is a 65 y.o. male who only has PMH known of HTN.  He presented to [facility] 9/15 with R hemiplegia and slurred speech. Last known well at 1030 and found on floor at 1230CT head was negative. 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  He was given TNKase and then required intubation.  He was then transferred to [facility] and was taken to IR and had mechanical thrombectomy with rescue stenting of unstable plaque and mid basilar artery. Of note, IR was unable to pass an OGT due to possible stricture.  Post procedure, he was admitted to neuro ICU where PCCM was asked to assist with vent management.  Pertinent  Medical History:  has Ischemic stroke (HCC) on their problem list.  Significant Hospital Events: Including procedures, antibiotic start and stop dates in addition to other pertinent events   9/15 > admit. 9/15: bronch to eval RUL, s/p tnk and revascularization with IR  Interim History / Subjective:  As above  Objective:  Blood pressure (!) 144/103, pulse (!) 115, temperature 97.9 F (36.6 C), temperature source Axillary, resp. rate 16, height 5\' 11"  (1.803 m), SpO2 98 %.    Vent Mode: PRVC FiO2 (%):  [80 %-100 %] 80 % Set Rate:  [16 bmp-20 bmp] 16 bmp Vt Set:  [500 mL-600 mL] 600 mL PEEP:  [5 cmH20] 5 cmH20 Plateau Pressure:  [23 cmH20] 23 cmH20   Intake/Output Summary (Last 24 hours) at 12/03/2020 1747 Last data filed at 12/03/2020 1651 Gross per 24 hour  Intake 1100 ml  Output 50 ml  Net 1050 ml   There were no vitals filed for this visit.  Examination: General: sedated, purposeful but not following commands with ett in place Neuro: purposeful with R UE HEENT: ncat, perrla, mmmp, englarged tongue Cardiovascular: tachy sinus no  m/g/r Lungs: coarse, rhonchi bilaterally Abdomen: protuberant but soft, bs diminished Musculoskeletal: well developed, unable to measure strength Skin: no rashes appreciated GU: foley in place.   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 MRI brain 9/16 >  Echo 9/16 >   Resolved Hospital Problem list:    Assessment & Plan:   Multifocal basilar and R vertebral occlusion  - s/p TNKase followed by IR mechanical thrombectomy and rescue stenting of mid basilar artery. - Post procedure management per IR. - Stroke workup / management per neuro. - F/u on CT head, MRI brain, echo. -goal sbp 120-140  -titrate sedation and cleviprex as needed  Respiratory insufficiency - due to inability to protect the airway in the setting of above. - Full vent support. - Assess ABG. - Wean as able. - Daily SBT. - Bronchial hygiene. - Follow CXR. Suspected aspiration event:  -rul with white out.  -start empiric abx for aspiration pna -resp culture  Hypertension. - Cleviprex PRN for goal SBP 120 - 140 per neuro. - Hold home Chlorthalidone.  Hypokalemia. - 4 runs K. - Empiric 2g Mag. - Follow BMP recheck at 2200  ? Possible esophageal stricture - OG unable to be passed by IR due to resistance and possible stricture. - Day team to please consult Cortrak team for attempt at tube placement. - Might  need GI consult and EGD with possible dilation if stricture present.  Hyperglycemia. - SSI. - Assess Hgb A1c.  Best practice (evaluated daily):  Diet/type: NPO DVT prophylaxis: other GI prophylaxis: PPI Lines: N/A Foley:  N/A Code Status:  full code Last date of multidisciplinary goals of care discussion: Per primary team.  Labs   CBC: Recent Labs  Lab 12/03/20 1302  WBC 13.5*  NEUTROABS 8.8*  HGB 13.4  HCT 40.9  MCV 86.8  PLT 298    Basic Metabolic  Panel: Recent Labs  Lab 12/03/20 1345  NA 133*  K 2.6*  CL 95*  CO2 26  GLUCOSE 203*  BUN 13  CREATININE 1.10  CALCIUM 8.9   GFR: Estimated Creatinine Clearance: 79.5 mL/min (by C-G formula based on SCr of 1.1 mg/dL). Recent Labs  Lab 12/03/20 1302  WBC 13.5*    Liver Function Tests: Recent Labs  Lab 12/03/20 1345  AST 30  ALT 25  ALKPHOS 72  BILITOT 0.6  PROT 7.3  ALBUMIN 3.9   No results for input(s): LIPASE, AMYLASE in the last 168 hours. No results for input(s): AMMONIA in the last 168 hours.  ABG No results found for: PHART, PCO2ART, PO2ART, HCO3, TCO2, ACIDBASEDEF, O2SAT   Coagulation Profile: Recent Labs  Lab 12/03/20 1345  INR 1.0    Cardiac Enzymes: No results for input(s): CKTOTAL, CKMB, CKMBINDEX, TROPONINI in the last 168 hours.  HbA1C: No results found for: HGBA1C  CBG: Recent Labs  Lab 12/03/20 1302 12/03/20 1705  GLUCAP 233* 121*    Review of Systems:   Unable to obtain as pt is encephalopathic.  Past Medical History:  He,  has no past medical history on file.   Surgical History:  unobtainable  Social History:    unobtainable  Family History:  His family history is not on file.   Allergies No Known Allergies   Home Medications  Prior to Admission medications   Medication Sig Start Date End Date Taking? Authorizing Provider  chlorthalidone (HYGROTON) 25 MG tablet Take 25 mg by mouth daily. 11/16/20   [provider]     Critical care time: 54   Critical care time: The patient is critically ill with multiple organ systems failure and requires high complexity decision making for assessment and support, frequent evaluation and titration of therapies, application of advanced monitoring technologies and extensive interpretation of multiple databases.  Critical care time 43 mins. This represents my time independent of the NPs time taking care of the pt. This is excluding procedures.    Briant Sites  DO [facility] 12/03/2020, 6:25 PM See Amion for pager If no response to pager, please call [phone] until 1900 After 1900 please call [facility] [phone]

## 2020-12-03 NOTE — Progress Notes (Signed)
NeuroInterventional Radiology  Pre-Procedure Note  History: 65 yo male presents to [facility] with right side deficits, concern for acute stroke.    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   Baseline mRS: 0   CTA:   Mid-basilar occlusion CTP:   N/a   I have discussed the case with Dr. Selina Cooley and Dr. Thomasena Edis of our Stroke Neurology team.  Basilar occlusion with acute decompensation has high rate of morbidity/mortality without treatment. Given the patient's symptoms, imaging findings, baseline function, we agree they are an appropriate candidate for attempt for mechanical thrombectomy, possible rescue stenting.    The risks and benefits of the procedure were unable to be discussed with the patient given his obtunded state, and there is no immediate available family.  Presumed risks include: bleeding, infection, arterial injury/dissection, contrast reaction, kidney injury, need for further procedure/surgery, neurologic deficit, 10-15% risk of intracranial hemorrhage, cardiopulmonary collapse, death.   Given the basilar occlusion and his rapid deterioration, his recent presentation, and risk of significant morbidity/mortality without treatment, our plan is to proceed with emergency consent.   Plan for cerebral angiogram and attempt at mechanical thrombectomy, possible other intervention.   Signed,   Yvone Neu. Loreta Ave, DO

## 2020-12-03 NOTE — ED Notes (Signed)
ET tube 8, 26 at teeth

## 2020-12-03 NOTE — Consult Note (Signed)
PHARMACIST CODE STROKE RESPONSE  Notified to mix TNK at 1320 by Dr. Bing Neighbors Delivered TNK to RN at 1325  TNK dose = 25mg  (32mL) IV over 5 seconds  Issues/delays encountered (if applicable): None  4m, PharmD Pharmacy Resident  12/03/2020 3:31 PM

## 2020-12-03 NOTE — Sedation Documentation (Signed)
Right groin arterial sheath removed. 2fr Angioseal deployed to right groin.

## 2020-12-03 NOTE — Anesthesia Preprocedure Evaluation (Addendum)
Anesthesia Evaluation  Patient identified by MRN, date of birth, ID band Patient unresponsive    Reviewed: Allergy & Precautions, Patient's Chart, lab work & pertinent test results, Unable to perform ROS - Chart review onlyPreop documentation limited or incomplete due to emergent nature of procedure.  History of Anesthesia Complications Negative for: history of anesthetic complications  Airway Mallampati: Intubated       Dental   Pulmonary   Intubated prior to transport       + intubated    Cardiovascular hypertension, Pt. on medications  Rhythm:Regular Rate:Normal     Neuro/Psych CVA negative psych ROS   GI/Hepatic   Endo/Other   Obesity K 2.6   Renal/GU      Musculoskeletal   Abdominal   Peds  Hematology   Anesthesia Other Findings   Reproductive/Obstetrics                            Anesthesia Physical Anesthesia Plan  ASA: 4 and emergent  Anesthesia Plan: General   Post-op Pain Management:    Induction: Inhalational  PONV Risk Score and Plan: 2 and Treatment may vary due to age or medical condition and Ondansetron  Airway Management Planned: Oral ETT  Additional Equipment: Arterial line  Intra-op Plan:   Post-operative Plan: Post-operative intubation/ventilation  Informed Consent:     Only emergency history available and History available from chart only  Plan Discussed with: CRNA and Anesthesiologist  Anesthesia Plan Comments:         Anesthesia Quick Evaluation

## 2020-12-03 NOTE — ED Notes (Signed)
Pt returns from CT with frothy at the mouth and agonal respirations, decisions made to intubate to protect airway.   This RN cannot perform stroke screen at this time due poor historian at this time.

## 2020-12-03 NOTE — Progress Notes (Signed)
Addendum to stroke code note from [facility]:  Door to needle time for TNK was 24 min.  Bing Neighbors, MD [facility] [phone]  If 7pm- 7am, please page neurology on call as listed in AMION.

## 2020-12-03 NOTE — Procedures (Signed)
Bronchoscopy Procedure Note  Travis Williams  [id number]  12/08/1955  Date:12/03/20  Time:6:43 PM   Provider Performing:Chrissie Dacquisto [facility]   Procedure(s):  Flexible Bronchoscopy [id number])  Indication(s) RUL obstuction and hypoxia  Consent Unable to obtain consent due to emergent nature of procedure.  Anesthesia See mar on continuous propofol and given versed    Time Out Verified patient identification, verified procedure, site/side was marked, verified correct patient position, special equipment/implants available, medications/allergies/relevant history reviewed, required imaging and test results available.   Sterile Technique Usual hand hygiene, masks, gowns, and gloves were used   Procedure Description Bronchoscope advanced through endotracheal tube and into airway.  Airways were examined down to subsegmental level with findings noted below.   Following diagnostic evaluation, RUL was cleared of thick mucous, bronch required to be removed with suction to remove much of plug. Airways appeared clear after bronch. Sats improved to >95% after completion   Findings: RUL with copious thick mucous   Complications/Tolerance None; patient tolerated the procedure well. Chest X-ray is needed post procedure.   EBL Minimal   Specimen(s) none

## 2020-12-03 NOTE — Code Documentation (Signed)
Code IR activated by [facility] where patient came in with right sided hemiplegia and aphasia. Patient received TNK and was intubated while at [facility]. Transported to [facility] via [facility]. Patient was met by Stroke team at ED bridge and transported to IR suite at 1453. Report given by [facility] to Robley Fries, RN.

## 2020-12-03 NOTE — ED Notes (Signed)
Tele-neuro (Deanna) RN activated

## 2020-12-04 ENCOUNTER — Inpatient Hospital Stay (HOSPITAL_COMMUNITY): Payer: Medicare HMO

## 2020-12-04 ENCOUNTER — Other Ambulatory Visit (HOSPITAL_COMMUNITY): Payer: Self-pay

## 2020-12-04 DIAGNOSIS — I639 Cerebral infarction, unspecified: Secondary | ICD-10-CM | POA: Diagnosis not present

## 2020-12-04 DIAGNOSIS — E876 Hypokalemia: Secondary | ICD-10-CM | POA: Diagnosis not present

## 2020-12-04 DIAGNOSIS — J9601 Acute respiratory failure with hypoxia: Secondary | ICD-10-CM | POA: Diagnosis not present

## 2020-12-04 DIAGNOSIS — I6389 Other cerebral infarction: Secondary | ICD-10-CM | POA: Diagnosis not present

## 2020-12-04 LAB — COMPREHENSIVE METABOLIC PANEL
Albumin: 3.2 g/dL — ABNORMAL LOW (ref 3.5–5.0)
Alkaline Phosphatase: 59 U/L (ref 38–126)
Anion gap: 13 (ref 5–15)
BUN: 12 mg/dL (ref 8–23)
CO2: 22 mmol/L (ref 22–32)
Calcium: 8 mg/dL — ABNORMAL LOW (ref 8.9–10.3)
Chloride: 95 mmol/L — ABNORMAL LOW (ref 98–111)
Creatinine, Ser: 1.05 mg/dL (ref 0.61–1.24)
GFR, Estimated: >60 mL/min (ref >60)
Glucose, Bld: 141 mg/dL — ABNORMAL HIGH (ref 70–99)
Potassium: 5.4 mmol/L — ABNORMAL HIGH (ref 3.5–5.1)
Sodium: 130 mmol/L — ABNORMAL LOW (ref 135–145)
Total Bilirubin: 2.6 mg/dL — ABNORMAL HIGH (ref 0.3–1.2)

## 2020-12-04 LAB — GLUCOSE, CAPILLARY
Glucose-Capillary: 161 mg/dL — ABNORMAL HIGH (ref 70–99)
Glucose-Capillary: 159 mg/dL — ABNORMAL HIGH (ref 70–99)
Glucose-Capillary: 176 mg/dL — ABNORMAL HIGH (ref 70–99)
Glucose-Capillary: 161 mg/dL — ABNORMAL HIGH (ref 70–99)

## 2020-12-04 LAB — CBC
HCT: 36.3 % — ABNORMAL LOW (ref 39.0–52.0)
Hemoglobin: 14.5 g/dL (ref 13.0–17.0)
MCH: 35.1 pg — ABNORMAL HIGH (ref 26.0–34.0)
MCHC: 39.9 g/dL — ABNORMAL HIGH (ref 30.0–36.0)
MCV: 87.9 fL (ref 80.0–100.0)
Platelets: 308 10*3/uL (ref 150–400)
RBC: 4.13 MIL/uL — ABNORMAL LOW (ref 4.22–5.81)
RDW: 14 % (ref 11.5–15.5)
WBC: 10.1 10*3/uL (ref 4.0–10.5)
nRBC: 0 % (ref 0.0–0.2)

## 2020-12-04 LAB — ABO/RH: ABO/RH(D): O POS

## 2020-12-04 LAB — MAGNESIUM: Magnesium: 2.7 mg/dL — ABNORMAL HIGH (ref 1.7–2.4)

## 2020-12-04 LAB — LIPID PANEL
Cholesterol: 189 mg/dL (ref 0–200)
LDL Cholesterol: UNDETERMINED mg/dL (ref 0–99)
Triglycerides: 2188 mg/dL — ABNORMAL HIGH (ref <150)
VLDL: UNDETERMINED mg/dL (ref 0–40)

## 2020-12-04 LAB — TYPE AND SCREEN
ABO/RH(D): O POS
Antibody Screen: NEGATIVE

## 2020-12-04 LAB — PHOSPHORUS: Phosphorus: 3.9 mg/dL (ref 2.5–4.6)

## 2020-12-04 LAB — LDL CHOLESTEROL, DIRECT: Direct LDL: UNDETERMINED mg/dL (ref 0–99)

## 2020-12-04 LAB — TRIGLYCERIDES: Triglycerides: 2352 mg/dL — ABNORMAL HIGH (ref <150)

## 2020-12-04 LAB — HEMOGLOBIN A1C
Hgb A1c MFr Bld: 5.7 % — ABNORMAL HIGH (ref 4.8–5.6)
Mean Plasma Glucose: 116.89 mg/dL

## 2020-12-04 MED ORDER — CHLORHEXIDINE GLUCONATE CLOTH 2 % EX PADS
6.0000 | MEDICATED_PAD | Freq: Every day | CUTANEOUS | Status: DC
  Administered 2020-12-05: 6 via TOPICAL

## 2020-12-04 MED ORDER — ASPIRIN 300 MG RE SUPP
150.0000 mg | Freq: Every day | RECTAL | Status: DC
  Administered 2020-12-04: 150 mg via RECTAL
  Filled 2020-12-04: qty 1

## 2020-12-04 MED ORDER — AMPICILLIN-SULBACTAM SODIUM 3 (2-1) G IJ SOLR
3.0000 g | Freq: Four times a day (QID) | INTRAMUSCULAR | Status: DC
  Administered 2020-12-04 – 2020-12-06 (×8): 3 g via INTRAVENOUS
  Filled 2020-12-04 (×8): qty 8

## 2020-12-04 MED ORDER — CHLORHEXIDINE GLUCONATE 0.12% ORAL RINSE (MEDLINE KIT)
15.0000 mL | Freq: Two times a day (BID) | OROMUCOSAL | Status: DC
  Administered 2020-12-04 – 2020-12-06 (×5): 15 mL via OROMUCOSAL

## 2020-12-04 MED ORDER — LABETALOL HCL 5 MG/ML IV SOLN
20.0000 mg | INTRAVENOUS | Status: DC | PRN
  Administered 2020-12-04 – 2020-12-05 (×5): 20 mg via INTRAVENOUS
  Filled 2020-12-04 (×4): qty 4

## 2020-12-04 MED ORDER — TICAGRELOR 90 MG PO TABS
90.0000 mg | ORAL_TABLET | Freq: Two times a day (BID) | ORAL | Status: DC
  Administered 2020-12-04 – 2020-12-05 (×3): 90 mg
  Filled 2020-12-04 (×3): qty 1

## 2020-12-04 MED ORDER — LABETALOL HCL 5 MG/ML IV SOLN
10.0000 mg | Freq: Once | INTRAVENOUS | Status: DC
  Filled 2020-12-04: qty 4

## 2020-12-04 MED ORDER — JEVITY 1.5 CAL/FIBER PO LIQD
1000.0000 mL | ORAL | Status: DC
  Administered 2020-12-04: 1000 mL
  Filled 2020-12-04 (×2): qty 1000

## 2020-12-04 MED ORDER — PROSOURCE TF PO LIQD
45.0000 mL | Freq: Two times a day (BID) | ORAL | Status: DC
  Administered 2020-12-04 – 2020-12-05 (×3): 45 mL
  Filled 2020-12-04 (×3): qty 45

## 2020-12-04 MED ORDER — NICARDIPINE HCL IN NACL 20-0.86 MG/200ML-% IV SOLN
3.0000 mg/h | INTRAVENOUS | Status: DC
  Administered 2020-12-04 (×2): 7.5 mg/h via INTRAVENOUS
  Administered 2020-12-04: 5 mg/h via INTRAVENOUS
  Administered 2020-12-04: 15 mg/h via INTRAVENOUS
  Filled 2020-12-04 (×4): qty 200

## 2020-12-04 MED ORDER — ORAL CARE MOUTH RINSE
15.0000 mL | OROMUCOSAL | Status: DC
  Administered 2020-12-04: 15 mL via OROMUCOSAL

## 2020-12-04 MED ORDER — FENTANYL CITRATE PF 50 MCG/ML IJ SOSY
25.0000 ug | PREFILLED_SYRINGE | Freq: Once | INTRAMUSCULAR | Status: AC
  Administered 2020-12-04: 25 ug via INTRAVENOUS

## 2020-12-04 MED ORDER — FENTANYL BOLUS VIA INFUSION
25.0000 ug | INTRAVENOUS | Status: DC | PRN
  Filled 2020-12-04: qty 100

## 2020-12-04 MED ORDER — FENTANYL 2500MCG IN NS 250ML (10MCG/ML) PREMIX INFUSION
25.0000 ug/h | INTRAVENOUS | Status: DC
Start: 2020-12-04 — End: 2020-12-04
  Administered 2020-12-04: 25 ug/h via INTRAVENOUS
  Filled 2020-12-04: qty 250

## 2020-12-04 NOTE — Progress Notes (Signed)
Echocardiogram attempted. Patient MRI schedule keeps changing. Will attempt again later as time and schedule permits.

## 2020-12-04 NOTE — Progress Notes (Signed)
NAME:  Travis Williams, MRN:  [id number], DOB:  23-Oct-1955, LOS: 1 ADMISSION DATE:  12/03/2020, CONSULTATION DATE:  12/03/20 REFERRING MD:  Stroke CHIEF COMPLAINT:  Vent Management   History of Present Illness:  Travis Williams is a 65 y.o. male who only has PMH known of HTN.  He presented to [facility] 9/15 with R hemiplegia and slurred speech. Last known well at 1030 and found on floor at 1230CT head was negative. 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  He was given TNKase and then required intubation.  He was then transferred to [facility] and was taken to IR and had mechanical thrombectomy with rescue stenting of unstable plaque and mid basilar artery. Of note, IR was unable to pass an OGT due to possible stricture.  Post procedure, he was admitted to neuro ICU where PCCM was asked to assist with vent management.  Pertinent  Medical History:  has Ischemic stroke (HCC) on their problem list.  Significant Hospital Events: Including procedures, antibiotic start and stop dates in addition to other pertinent events   9/15 > admit. 9/15: bronch to eval RUL, s/p tnk and revascularization with IR  Interim History / Subjective:  This morning on fentanyl, propofol. Awake and following commands on high doses of medication  Objective:  Blood pressure 127/76, pulse (!) 103, temperature 98.3 F (36.8 C), temperature source Axillary, resp. rate 16, height 5\' 11"  (1.803 m), SpO2 96 %.    Vent Mode: PRVC FiO2 (%):  [50 %-100 %] 50 % Set Rate:  [16 bmp-20 bmp] 16 bmp Vt Set:  [500 mL-600 mL] 600 mL PEEP:  [5 cmH20-8 cmH20] 8 cmH20 Plateau Pressure:  [20 cmH20-23 cmH20] 23 cmH20   Intake/Output Summary (Last 24 hours) at 12/04/2020 1050 Last data filed at 12/04/2020 1000 Gross per 24 hour  Intake 3067.3 ml  Output 1800 ml  Net 1267.3 ml   There were no vitals filed for this visit.  Examination: General: sedated, intubated, calm Neuro: moves RUE HEENT: ETT to  vent Cardiovascular: tachycardic, regular no mrg Lungs: clear, no wheezes or crackles, sounds of mechanical ventilation auscultated Abdomen: obese, soft,  Musculoskeletal: no edema Skin: no rashes appreciated GU: foley in place.   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 MRI brain 9/16 >  Echo 9/16 >   Resolved Hospital Problem list:    Assessment & Plan:   Multifocal basilar and R vertebral occlusion  - s/p TNKase followed by IR mechanical thrombectomy and rescue stenting of mid basilar artery. - Post procedure management per IR. - Stroke workup / management per neuro. - F/u on CT head, MRI brain, echo. -goal sbp 120-140  -titrate sedation and cleviprex as needed - cortrack placed this morning for enteral access to give brillinta. Can resume brillinta and asa oral.  - will need SLP eval after extubation to see if he can tolerate po.  Respiratory failure - due to inability to protect the airway in the setting of above. Aspiration PNA - Full vent support. - will plan for SBT and SAT today  - Bronchial hygiene. - cultures show GPC, mrsa negative. Will narrow abx to unasyn for total 5 days abx   Hypertension. - Cleviprex PRN for goal SBP 120 - 140 per neuro. - Hold home Chlorthalidone.  Hypokalemia. - replaced 9/15 - labs this morning hemolyzed  Hyperglycemia. - SSI. - Assess Hgb  A1c.  Best practice (evaluated daily):  Diet/type: NPO DVT prophylaxis: other GI prophylaxis: PPI Lines: N/A Foley:  N/A Code Status:  full code Last date of multidisciplinary goals of care discussion: updated sister and BIL at bedside this morning.   The patient is critically ill due to respiratory failure, stroke.  Critical care was necessary to treat or prevent imminent or life-threatening deterioration.  Critical care was time spent personally by me on the  following activities: development of treatment plan with patient and/or surrogate as well as nursing, discussions with consultants, evaluation of patient's response to treatment, examination of patient, obtaining history from patient or surrogate, ordering and performing treatments and interventions, ordering and review of laboratory studies, ordering and review of radiographic studies, pulse oximetry, re-evaluation of patient's condition and participation in multidisciplinary rounds.   Critical Care Time devoted to patient care services described in this note is 55 minutes. This time reflects time of care of this signee Charlott Holler . This critical care time does not reflect separately billable procedures or procedure time, teaching time or supervisory time of PA/NP/Med student/Med Resident etc but could involve care discussion time.       Charlott Holler [facility] 12/04/2020 10:51 AM  Pager: see AMION  If no response to pager , please call critical care on call (see AMION) until 7pm After 7:00 pm call Elink     Labs   CBC: Recent Labs  Lab 12/03/20 1302 12/03/20 1753 12/04/20 0516  WBC 13.5*  --  10.1  NEUTROABS 8.8*  --   --   HGB 13.4 13.9 14.5  HCT 40.9 41.0 36.3*  MCV 86.8  --  87.9  PLT 298  --  308    Basic Metabolic Panel: Recent Labs  Lab 12/03/20 1345 12/03/20 1753 12/03/20 2235 12/04/20 0516  NA 133* 135 132* 130*  K 2.6* 3.1* 3.9 5.4*  CL 95*  --  95* 95*  CO2 26  --  25 22  GLUCOSE 203*  --  115* 141*  BUN 13  --  11 12  CREATININE 1.10  --  0.79 1.05  CALCIUM 8.9  --  8.5* 8.0*  MG  --  2.1  --   --   PHOS  --  2.6  --   --    GFR: Estimated Creatinine Clearance: 83.3 mL/min (by C-G formula based on SCr of 1.05 mg/dL). Recent Labs  Lab 12/03/20 1302 12/04/20 0516  WBC 13.5* 10.1    Liver Function Tests: Recent Labs  Lab 12/03/20 1345 12/04/20 0516  AST 30 RESULTS UNAVAILABLE DUE TO INTERFERING SUBSTANCE   ALT 25 RESULTS UNAVAILABLE DUE TO INTERFERING SUBSTANCE  ALKPHOS 72 59  BILITOT 0.6 2.6*  PROT 7.3 RESULTS UNAVAILABLE DUE TO INTERFERING SUBSTANCE  ALBUMIN 3.9 3.2*   No results for input(s): LIPASE, AMYLASE in the last 168 hours. No results for input(s): AMMONIA in the last 168 hours.  ABG    Component Value Date/Time   PHART 7.426 12/03/2020 1753   PCO2ART 38.8 12/03/2020 1753   PO2ART 53 (L) 12/03/2020 1753   HCO3 25.7 12/03/2020 1753   TCO2 27 12/03/2020 1753   O2SAT 89.0 12/03/2020 1753     Coagulation Profile: Recent Labs  Lab 12/03/20 1345 12/03/20 1753  INR 1.0 1.0    Cardiac Enzymes: No results for input(s): CKTOTAL, CKMB, CKMBINDEX, TROPONINI in the last 168 hours.  HbA1C: Hgb A1c MFr Bld  Date/Time Value Ref Range  Status  12/04/2020 05:16 AM 5.7 (H) 4.8 - 5.6 % Final    Comment:    (NOTE) Pre diabetes:          5.7%-6.4%  Diabetes:              >6.4%  Glycemic control for   <7.0% adults with diabetes     CBG: Recent Labs  Lab 12/03/20 1302 12/03/20 1705 12/03/20 1935 12/03/20 2320  GLUCAP 233* 121* 197* 117*

## 2020-12-04 NOTE — Progress Notes (Signed)
Per [facility] order from Dr Loreta Ave, Cangrelor gtt stopped at 1805.  Sherral Hammers RN

## 2020-12-04 NOTE — Progress Notes (Addendum)
Scheduled patient's MRI for 1400.    1405 update: as patient was leaving unit for MRI I got called saying we had to push this patient's scan back. MRI will call me when able to bring patient downstairs.   Sherral Hammers RN

## 2020-12-04 NOTE — Progress Notes (Signed)
eLink Physician-Brief Progress Note Patient Name: Travis Williams DOB: 03/23/1955 MRN: [id number]   Date of Service  12/04/2020  HPI/Events of Note  Pt hypertensive  eICU Interventions  Continue clevidipine, continue propofol, adding fent gtt     Intervention Category Intermediate Interventions: Other:  Jacinta Shoe 12/04/2020, 4:59 AM

## 2020-12-04 NOTE — Progress Notes (Signed)
Patient hypertensive >180 SBP after MRI despite PRN labetalol given. Restarted patient on cardene gtt.   Sherral Hammers RN

## 2020-12-04 NOTE — Progress Notes (Signed)
Initial Nutrition Assessment  DOCUMENTATION CODES:   Not applicable  INTERVENTION:   Initiate tube feeding via Cortrak tube: Jevity 1.5 at 60 ml/h (1440 ml per day) Prosource TF 45 ml BID  Provides 2240 kcal, 112 gm protein, 1094 ml free water daily   NUTRITION DIAGNOSIS:   Inadequate oral intake related to inability to eat as evidenced by NPO status.  GOAL:   Patient will meet greater than or equal to 90% of their needs  MONITOR:   TF tolerance  REASON FOR ASSESSMENT:   Consult Enteral/tube feeding initiation and management  ASSESSMENT:   Pt with PMH of HTN admitted with R hemiplegia and slurred speech due to ischemic stroke.   9/15 s/p TNK and I thrombectomy with stent; s/p bronch for RUL poss asp PNA 9/16 extubated, cortrak placed; tip gastric   Spoke with RN, Cortrak in. Plan to have swallow eval tomorrow. Spoke with MD, ok to start TF.    Medications reviewed and include: colace, SSI, protonix, miralax Cardene  Labs reviewed: Na 130, K: 5.4, TG: 2352, A1C: 5.7 CBG's: 159-176    NUTRITION - FOCUSED PHYSICAL EXAM:  Remote   Diet Order:   Diet Order             Diet NPO time specified  Diet effective now                   EDUCATION NEEDS:   No education needs have been identified at this time  Skin:  Skin Assessment: Reviewed RN Assessment  Last BM:  unknown  Height:   Ht Readings from Last 1 Encounters:  12/03/20 5\' 11"  (1.803 m)    Weight:   Wt Readings from Last 1 Encounters:  12/03/20 99.8 kg    BMI:  Body mass index is 30.69 kg/m.  Estimated Nutritional Needs:   Kcal:  2100-2300  Protein:  105-120 grams  Fluid:  >2 L/day  12/05/20., RD, LDN, CNSC See AMiON for contact information

## 2020-12-04 NOTE — Progress Notes (Signed)
STROKE TEAM PROGRESS NOTE   INTERVAL HISTORY No acute events  Opening eyes to voice, following commands x 4 extremities, reaching for ETT tube.  Zosyn on board for aspiration PNA, tracheal aspirate GPCs+ Possible extubation discussed with CCM.  Nursing reports agitation with stimulation, remains on higher dose sedation MRI today pending his extubation status with airway prioritized.  No visitors at bedside.   Vitals:   12/04/20 0700 12/04/20 0743 12/04/20 0755 12/04/20 0800  BP: 134/86   123/74  Pulse: (!) 108 (!) 111 (!) 112 (!) 111  Resp: 16 20 16 16   Temp:    98.3 F (36.8 C)  TempSrc:    Axillary  SpO2: 97% 96% 96% 94%  Height:       CBC:  Recent Labs  Lab 12/03/20 1302 12/03/20 1753 12/04/20 0516  WBC 13.5*  --  10.1  NEUTROABS 8.8*  --   --   HGB 13.4 13.9 14.5  HCT 40.9 41.0 36.3*  MCV 86.8  --  87.9  PLT 298  --  308   Basic Metabolic Panel:  Recent Labs  Lab 12/03/20 1753 12/03/20 2235 12/04/20 0516  NA 135 132* 130*  K 3.1* 3.9 5.4*  CL  --  95* 95*  CO2  --  25 22  GLUCOSE  --  115* 141*  BUN  --  11 12  CREATININE  --  0.79 1.05  CALCIUM  --  8.5* 8.0*  MG 2.1  --   --   PHOS 2.6  --   --    Lipid Panel:  Recent Labs  Lab 12/04/20 0516  CHOL 189  TRIG 2,188*  HDL NOT REPORTED DUE TO HIGH TRIGLYCERIDES  CHOLHDL NOT REPORTED DUE TO HIGH TRIGLYCERIDES  VLDL UNABLE TO CALCULATE IF TRIGLYCERIDE OVER 400 mg/dL  LDLCALC UNABLE TO CALCULATE IF TRIGLYCERIDE OVER 400 mg/dL   12/06/20:  Recent Labs  Lab 12/04/20 0516  HGBA1C 5.7*   Urine Drug Screen:  Recent Labs  Lab 12/03/20 1803  LABOPIA NONE DETECTED  COCAINSCRNUR NONE DETECTED  LABBENZ NONE DETECTED  AMPHETMU NONE DETECTED  THCU POSITIVE*  LABBARB NONE DETECTED    Alcohol Level  Recent Labs  Lab 12/03/20 2235  ETH <10    IMAGING past 24 hours CT HEAD WO CONTRAST  Result Date: 12/03/2020 CLINICAL DATA:  Stroke EXAM: CT HEAD WITHOUT CONTRAST TECHNIQUE: Contiguous axial images  were obtained from the base of the skull through the vertex without intravenous contrast. COMPARISON:  12/03/2020 FINDINGS: Brain: Interval stent placement within the basilar artery. Interval development of hypodensities within the bilateral cerebellar hemispheres, right greater than left, which could reflect evolving infarcts. Chronic small vessel ischemic changes are identified within the periventricular white matter, stable. No signs of acute hemorrhage. Lateral ventricles and remaining midline structures are unremarkable. No acute extra-axial fluid collections. No mass effect. Vascular: Stable atherosclerosis of the internal carotid arteries. New stent within the basilar artery. No hyperdense vessel. Skull: Normal. Negative for fracture or focal lesion. Sinuses/Orbits: Diffuse mucosal thickening throughout the ethmoid, sphenoid, and maxillary sinuses may be due to intubation. Other: None. IMPRESSION: 1. Subtle hypodensities within the bilateral cerebellar hemispheres which could reflect areas of evolving infarction. 2. No evidence of intracranial hemorrhage. 3. Interval placement of a basilar artery stent. Electronically Signed   By: 12/05/2020 M.D.   On: 12/03/2020 22:00   DG Chest Port 1 View  Result Date: 12/03/2020 CLINICAL DATA:  Acute respiratory failure. Endotracheally intubated. EXAM: PORTABLE CHEST 1  VIEW COMPARISON:  None. FINDINGS: Endotracheal tube is seen in place, with distal tip approximately 4.5 cm above the carina. Low lung volumes are noted. Right upper lobe collapse is seen as well as atelectasis or infiltrate in the left lower lobe. No evidence of pneumothorax. IMPRESSION: Endotracheal tube in appropriate position. Right upper lobe collapse, and left lower lobe atelectasis versus infiltrate. Electronically Signed   By: Danae Orleans M.D.   On: 12/03/2020 18:37   CT HEAD CODE STROKE WO CONTRAST  Addendum Date: 12/03/2020   ADDENDUM REPORT: 12/03/2020 14:11 ADDENDUM: Suspected small  age-indeterminate infarct within the right cerebellar hemisphere (series 6, image 18) (series 5, images 50 and 51). These results were called by telephone at the time of interpretation on 12/03/2020 at 1:42 PM to provider Dr. Selina Cooley, who verbally acknowledged these results. Electronically Signed   By: Jackey Loge D.O.   On: 12/03/2020 14:11   Result Date: 12/03/2020 CLINICAL DATA:  Code stroke. Neuro deficit, acute, stroke suspected. Altered mental status. Last known well 10:30 a.m. EXAM: CT HEAD WITHOUT CONTRAST TECHNIQUE: Contiguous axial images were obtained from the base of the skull through the vertex without intravenous contrast. COMPARISON:  None FINDINGS: Brain: Mild generalized cerebral and cerebellar atrophy. Mild patchy and ill-defined hypoattenuation within the cerebral white matter, nonspecific but compatible with chronic small vessel ischemic disease. Age-indeterminate lacunar infarct within the central pons (series 3, image 10) (series 5, image 39) (series 6, image 28). There is no acute intracranial hemorrhage. No acute demarcated cortical infarct. No extra-axial fluid collection. No evidence of an intracranial mass. No midline shift. Vascular: No hyperdense vessel.  Atherosclerotic calcifications. Skull: Normal. Negative for fracture or focal lesion. Sinuses/Orbits: Visualized orbits show no acute finding. Trace scattered paranasal sinus mucosal thickening at the imaged levels. ASPECTS ([place]berta Stroke Program Early CT Score) - Ganglionic level infarction (caudate, lentiform nuclei, internal capsule, insula, M1-M3 cortex): 7 - Supraganglionic infarction (M4-M6 cortex): 3 Total score (0-10 with 10 being normal): 10 These results were called by telephone at the time of interpretation on 12/03/2020 at 1:19 pm to provider Dr. Selina Cooley, who verbally acknowledged these results. IMPRESSION: No evidence of acute intracranial hemorrhage or acute demarcated cortical infarction. Age-indeterminate lacunar infarct  within the central pons. Mild chronic small-vessel ischemic changes within the cerebral white matter. Mild generalized parenchymal atrophy. Electronically Signed: By: Jackey Loge D.O. On: 12/03/2020 13:26   CT ANGIO HEAD NECK W WO CM (CODE STROKE)  Result Date: 12/03/2020 CLINICAL DATA:  Neuro deficit, acute, stroke suspected. Altered mental status. EXAM: CT ANGIOGRAPHY HEAD AND NECK TECHNIQUE: Multidetector CT imaging of the head and neck was performed using the standard protocol during bolus administration of intravenous contrast. Multiplanar CT image reconstructions and MIPs were obtained to evaluate the vascular anatomy. Carotid stenosis measurements (when applicable) are obtained utilizing NASCET criteria, using the distal internal carotid diameter as the denominator. CONTRAST:  75mL OMNIPAQUE IOHEXOL 350 MG/ML SOLN COMPARISON:  Noncontrast head CT performed earlier today 12/03/2020. FINDINGS: CTA NECK FINDINGS Aortic arch: Standard aortic branching. Atherosclerotic plaque within the visualized aortic arch and proximal major branch vessels of the neck. Streak and beam hardening artifact arising from a dense right-sided contrast bolus partially obscures the right subclavian artery. Within this limitation, there is no hemodynamically significant innominate or proximal subclavian artery stenosis. Right carotid system: CCA and ICA patent within the neck. Soft and calcified plaque within the carotid bifurcation and proximal ICA. Apparent high-grade (greater than 70% stenosis within the proximal ICA (series 6,  image 173). However, beam hardening artifact and vessel tortuosity limit evaluation of the vessel lumen at this site, and it is suspected that artifact may significantly accentuate this apparent stenosis. Left carotid system: CCA and ICA patent within the neck. Soft and calcified plaque within the carotid bifurcation and proximal ICA. Calcified plaque results in less than 50% stenosis within the proximal  ICA. Vertebral arteries: The right vertebral artery is non dominant. There is severe atherosclerotic stenosis at the origin of the right vertebral artery. The right vertebral artery appears occluded at the distal V2 segment level and at the level of the V2/V3 junction. There is reconstitution of this vessel intracranially, possibly due to retrograde flow. The dominant left vertebral artery is patent throughout the neck without stenosis. Skeleton: No acute bony abnormality or aggressive osseous lesion. Cervical spondylosis, advanced at C6-C7. Cervical levocurvature. Cervicothoracic dextrocurvature more inferiorly. Other neck: No neck mass or cervical lymphadenopathy. Upper chest: No consolidation within the imaged lung apices. Mild fluid distention of the mid to distal esophagus. Review of the MIP images confirms the above findings CTA HEAD FINDINGS Anterior circulation: The intracranial internal carotid arteries are patent. Calcified plaque within both vessels. Moderate stenosis within the cavernous internal carotid arteries bilaterally. The M1 middle cerebral arteries are patent. Atherosclerotic irregularity of the M2 and more distal middle cerebral artery vessels bilaterally. No M2 proximal branch occlusion is identified. The anterior cerebral arteries are patent. No intracranial aneurysm is identified. Posterior circulation: Reconstitution of the V4 right vertebral artery intracranially, which may be due to retrograde flow. The dominant left vertebral artery is patent intracranially without stenosis. The basilar artery is patent proximally. However, there is a focal occlusion of the mid basilar artery measuring 7 mm in length. There is an apparent additional focal occlusion within the distal basilar artery. Enhancement is present within the basilar tip. The posterior cerebral arteries are patent. Both PCAs demonstrated multifocal atherosclerotic irregularity. Most notably, there is a severe stenosis at the origin  of the left posterior cerebral artery, and a severe stenosis within the P2 right posterior cerebral artery. Posterior communicating arteries are hypoplastic or absent bilaterally. Venous sinuses: Within the limitations of contrast timing, no convincing thrombus. Anatomic variants: As described Review of the MIP images confirms the above findings Findings of right vertebral artery occlusion and multifocal basilar artery occlusion called by telephone at the time of interpretation on 12/03/2020 at 1:42 pm to provider [facility] , who verbally acknowledged these results. IMPRESSION: CTA neck: 1. Severe atherosclerotic stenosis at the origin of the non-dominant right vertebral artery. The vertebral artery appears occluded at the distal V2 segment level, and at the level of the V2/V3 junction. There is reconstitution of the right vertebral artery more distally, possibly due to retrograde flow. 2. The left vertebral artery is dominant and patent throughout the neck without stenosis. 3. The common carotid and internal carotid arteries are patent within the neck. There is apparent high-grade (greater than 70%) focal stenosis of the proximal right ICA. However, beam hardening artifact and vessel tortuosity significantly limit evaluation of the vessel lumen at this site, and it is suspected that artifact may significantly accentuate this apparent stenosis. A carotid artery duplex examination is recommended for further assessment. Atherosclerotic plaque within the left carotid bifurcation and proximal ICA results in less than 50% stenosis within the proximal left ICA. 4. Mild fluid distention of the visualized mid-to-distal esophagus. Aspiration precautions advised. CTA head: 1. 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 Enhancement is present at  the basilar tip. 2. Enhancement is present within the V4 right vertebral artery, which may be due to retrograde flow (given occlusion of this vessel more proximally).  3. 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 4. Severe stenosis at the origin of the left posterior cerebral artery. 5. Severe stenosis within the P2 right posterior cerebral artery. 6. Moderate stenosis within the cavernous internal carotid arteries, bilaterally. Electronically Signed   By: Jackey Loge D.O.   On: 12/03/2020 14:10    PHYSICAL EXAM Obese middle-aged African-American male who is intubated and sedated.  Not in distress. . Afebrile. Head is nontraumatic. Neck is supple without bruit.    Cardiac exam no murmur or gallop. Lungs are clear to auscultation. Distal pulses are well felt.  Neurological Exam : Sedated and intubated.  Eyes are closed but patient can easily aroused and opens eyes.  Follows simple midline commands and follows gaze in all directions.  Is able to move all 4 extremities against gravity there does not appear to be focal weakness.  Deep tendon reflexes symmetric.  Withdraws to pain in all 4 extremities.  Plantars downgoing. ASSESSMENT/PLAN Travis Williams is a 65 y.o. male who only has PMH known of HTN.  He presented to [facility]/15 with R hemiplegia and slurred speech. NIHSS 16. Last known well at 1030 and found on floor at 1230CT head was negative. 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 He was given TNKase and then required intubation. 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 IR team, Dr. Loreta Ave, emergently consulted and patient was emergently taken to IR.    Right hemiplegia and slurred speech with imaging showed multifocal basilar occlusion and vertebral occlusion s/p emergent mechanical thrombectomy and basilar artery stent placement.  Code Stroke  No evidence of acute intracranial hemorrhage or acute demarcated cortical infarction. Age-indeterminate lacunar infarct within the central pons. Mild chronic small-vessel ischemic changes within the cerebral  white matter. Mild generalized parenchymal atrophy. CTA head & neck  1. 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 Enhancement is present at the basilar tip. 2. Enhancement is present within the V4 right vertebral artery, which may be due to retrograde flow (given occlusion of this vessel more proximally). 3. 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 4. Severe stenosis at the origin of the left posterior cerebral artery. 5. Severe stenosis within the P2 right posterior cerebral artery. 6. Moderate stenosis within the cavernous internal carotid arteries, bilaterally.  1. Severe atherosclerotic stenosis at the origin of the non-dominant right vertebral artery. The vertebral artery appears occluded at the distal V2 segment level, and at the level of the V2/V3 junction. There is reconstitution of the right vertebral artery more distally, possibly due to retrograde flow. 2. The left vertebral artery is dominant and patent throughout the neck without stenosis. 3. The common carotid and internal carotid arteries are patent within the neck. There is apparent high-grade (greater than 70%) focal stenosis of the proximal right ICA. However, beam hardening artifact and vessel tortuosity significantly limit evaluation of the vessel lumen at this site, and it is suspected that artifact may significantly accentuate this apparent stenosis. A carotid artery duplex examination is recommended for further assessment. Atherosclerotic plaque within the left carotid bifurcation and proximal ICA results in less than 50% stenosis within the proximal left ICA. 4. Mild fluid distention of the visualized mid-to-distal esophagus. Aspiration precautions advised. Post IR CT  1. Subtle hypodensities  within the bilateral cerebellar hemispheres which could reflect areas of evolving infarction. 2. No evidence of intracranial hemorrhage. 3.  Interval placement of a basilar artery stent.   MRI scattered bilateral punctate cerebellar, brainstem and occipital lobe infarcts.  No hemorrhage  2D Echo PENDING LDL UNABLE TO CALCULATE IF TRIGLYCERIDE OVER 400 mg/dL [id number] 5.7 VTE prophylaxis -     Diet   Diet NPO time specified   No antiplatelet or anticoagulant prior to admission Therapy recommendations:  Pending Disposition:  TBD   1. Subtle hypodensities within the bilateral cerebellar hemispheres which could reflect areas of evolving infarction. 2. No evidence of intracranial hemorrhage. 3. Interval placement of a basilar artery stent.   S/p emergent thrombectomy with mid-basilar  unstable plaque occlusion found, initial T1C1 2a then TICI 3, with reocclusion, prompting rescue basilar artery stent On ASA, with Brillinta to be started with tube placement Post IR care by Dr. Loreta Ave and team   Hypertension Home meds: Chlorthalidone 25mg  on hold  Requiring cardene drip, transition to po agent as able BP goal systolic 120-140 x 24 hours, then less than 180  Long-term BP goal normotensive        Respiratory Failure       Aspiration PNA Remains intubated, mechanically ventilated Management per CCM appreciated On 5 day course of Unasyn   Hyperlipidemia, uncontrolled, severe Home meds:  None LDL UNABLE TO CALCULATE IF TRIGLYCERIDE OVER 400 mg/dL, goal < 70 High intensity statin: Add Lipitor 80 mg when able  Continue statin at discharge       Dysphagia      Feeding Cortrak placed today,immediate access needed for antiplatelet therapy.  Tube feeding per RD recs  Swallow eval pending extubation         Hyperglycemia A1C 5.7 CCM monitoring  Other Stroke Risk Factors Advanced Age >/= 26  Former Cigarette smoker Substance abuse - UDS:  THC POSITIVE, Cocaine NONE DETECTED. Patient advised to stop using due to stroke risk. Obesity, Body mass index is 30.69 kg/m., BMI >/= 30 associated with increased stroke risk, will  recommend weight loss, diet and exercise as appropriate  Unknown if Family hx stroke (unobtainable) High risk for obstructive sleep apnea, not on on CPAP at home   Other Active Problems  Hospital day # 1  This patient was seen and evaluated with Dr. 76. He directed the plan of care.  Pearlean Brownie, NP-C   Stroke MD note : I have personally obtained history,examined this patient, reviewed notes, independently viewed imaging studies, participated in medical decision making and plan of care.ROS completed by me personally and pertinent positives fully documented  I have made any additions or clarifications directly to the above note. Agree with note above.  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  Continue close neurological monitoring and strict blood pressure control as per post intervention protocol.  Extubate as tolerated per critical care team. Patient will likely need a feeding tube to give his aspirin and Brilinta which is necessary to prevent stent reocclusion.  No family available at the bedside for discussion.  Discussed with Dr. Shon Hale critical care medicine. This patient is critically ill and at significant risk of neurological worsening, death and care requires constant monitoring of vital signs, hemodynamics,respiratory and cardiac monitoring, extensive review of multiple databases, frequent neurological assessment, discussion with family, other specialists and medical decision making of high complexity.I have made any additions or  clarifications directly to the above note.This critical care time does not reflect procedure time, or teaching time or supervisory time of PA/NP/Med Resident etc but could involve care discussion time.  I spent 40 minutes of neurocritical care time  in the care of  this patient.     Delia Heady, MD Medical  Director [facility] Pager: [phone] 12/04/2020 5:03 PM  To contact Stroke Continuity provider, please refer to [email or web address]. After hours, contact General Neurology

## 2020-12-04 NOTE — Progress Notes (Signed)
PT Cancellation Note  Patient Details Name: Egon Dittus MRN: [id number] DOB: 03-27-1955   Cancelled Treatment:    Reason Eval/Treat Not Completed: Active bedrest order Will follow up once bed rest orders are lifted.  Jahzara Slattery A. Dan Humphreys PT, DPT Acute Rehabilitation Services Pager [phone] Office [phone]    Viviann Spare 12/04/2020, 8:06 AM

## 2020-12-04 NOTE — Progress Notes (Signed)
Referring Physician(s): Code Stroke  Supervising Physician: Gilmer Mor  Patient Status:  [facility] - In-pt  Chief Complaint: Right hemiplegia, slurred speech Mid basilar artery occlusion  Subjective: Patient remains intubated this AM.  He is arousable despite propofol. Able to follow commands.  Moving all extremities.  Right hand in mitten.  No family at bedside.   Allergies: Patient has no known allergies.  Medications: Prior to Admission medications   Medication Sig Start Date End Date Taking? Authorizing Provider  chlorthalidone (HYGROTON) 25 MG tablet Take 25 mg by mouth daily. 11/16/20  Yes [provider]     Vital Signs: BP (!) 152/77   Pulse (!) 117   Temp 98.9 F (37.2 C) (Oral)   Resp 13   Ht 5\' 11"  (1.803 m)   SpO2 94%   BMI 30.69 kg/m   Physical Exam NAD, alert Neuro:  arousable on vent, slightly agitated.  Following commands to move all extremities, however exam limited by intubation with sedation.  Groin: stable, intact. No oozing, bruising. Soft. No evidence of hematoma or pseudoaneurysm. Pulses: DP and PT dopplerable  Imaging: CT HEAD WO CONTRAST  Result Date: 12/03/2020 CLINICAL DATA:  Stroke EXAM: CT HEAD WITHOUT CONTRAST TECHNIQUE: Contiguous axial images were obtained from the base of the skull through the vertex without intravenous contrast. COMPARISON:  12/03/2020 FINDINGS: Brain: Interval stent placement within the basilar artery. Interval development of hypodensities within the bilateral cerebellar hemispheres, right greater than left, which could reflect evolving infarcts. Chronic small vessel ischemic changes are identified within the periventricular white matter, stable. No signs of acute hemorrhage. Lateral ventricles and remaining midline structures are unremarkable. No acute extra-axial fluid collections. No mass effect. Vascular: Stable atherosclerosis of the internal carotid arteries. New stent within the basilar artery. No  hyperdense vessel. Skull: Normal. Negative for fracture or focal lesion. Sinuses/Orbits: Diffuse mucosal thickening throughout the ethmoid, sphenoid, and maxillary sinuses may be due to intubation. Other: None. IMPRESSION: 1. Subtle hypodensities within the bilateral cerebellar hemispheres which could reflect areas of evolving infarction. 2. No evidence of intracranial hemorrhage. 3. Interval placement of a basilar artery stent. Electronically Signed   By: Sharlet Salina M.D.   On: 12/03/2020 22:00   IR Intra Cran Stent  Result Date: 12/04/2020 INDICATION: 65 year old male presents with acute basilar occlusion for mechanical thrombectomy EXAM: ULTRASOUND-GUIDED ACCESS RIGHT COMMON FEMORAL ARTERY CERVICAL AND CEREBRAL ANGIOGRAM MECHANICAL THROMBECTOMY BASILAR ARTERY RESCUE STENT UNSTABLE PLAQUE BASILAR ARTERY ANGIO-SEAL DEPLOYED FOR HEMOSTASIS COMPARISON:  CT imaging of the same day MEDICATIONS: Intravenous kangrelor bolus and initiation of drip ANESTHESIA/SEDATION: The anesthesia team was present to provide general endotracheal tube anesthesia and for patient monitoring during the procedure. Intubation was performed before arrival to the neuro angio suite. Left radial arterial line was performed by the anesthesia team. Interventional neuro radiology nursing staff was also present. CONTRAST:  200 cc FLUOROSCOPY TIME:  Fluoroscopy Time: 23 minutes 0 seconds (2,527 mGy). COMPLICATIONS: None TECHNIQUE: Emergent consent was implied, with assumed procedural risks, benefits and alternatives. Specific risks include: Bleeding, infection, contrast reaction, kidney injury/failure, need for further procedure/surgery, arterial injury or dissection, embolization to new territory, intracranial hemorrhage (10-15% risk), neurologic deterioration, cardiopulmonary collapse, death. Maximal Sterile Barrier Technique was utilized including during the procedure including caps, mask, sterile gowns, sterile gloves, sterile drape, hand  hygiene and skin antiseptic. A timeout was performed prior to the initiation of the procedure. The anesthesia team was present to provide general endotracheal tube anesthesia and for patient monitoring during  the procedure. Interventional neuro radiology nursing staff was also present. FINDINGS: Initial Findings: Left vertebral artery: Normal course caliber and contour. Basilar artery: Short segment critical stenosis within the mid basilar artery, corresponding to findings on the preoperative CT angiogram. Contrast penetrates beyond the occlusion, but with minimal filling of the territory beyond the P1 arteries and superior cerebellar arteries. There is a fairly rounded luminal filling defect at the region of stenosis, with the most likely diagnosis being unstable ruptured atherosclerotic plaque. Initial TICI flow: TICI 1. Mild irregularity in the proximal basilar artery and the right vertebral artery compatible with intracranial atherosclerotic changes. Completion Findings: After the initial aspiration thrombectomy there is complete restoration of flow through the segment, with adequate distal perfusion, compatible with TICI 3: Complete perfusion of the territory. After observation for 5 minutes-10 minutes there is reocclusion at the unstable plaque site. A second aspiration thrombectomy reopened this segment, and then rescue stenting was performed with balloon mounted 2.5 mm x 8 mm on X resolution stent, as described in the procedural note. After the stent was deployed there was again restoration of complete flow through the segment, achieving TICI 3: Complete perfusion of the territory Completion TICI: TICI 3 Flat panel CT: No acute intracranial hemorrhage. No midline shift or mass effect after reperfusion. No contrast staining of the affected territory observed. PROCEDURE: The anesthesia team was present to provide general endotracheal tube anesthesia and for patient monitoring during the procedure. Intubation  was performed in negative pressure [place] in neuro IR holding. Interventional neuro radiology nursing staff was also present. Ultrasound survey of the right inguinal region was performed with images stored and sent to PACs. 11 blade scalpel was used to make a small incision. Blunt dissection was performed with US guidance. A micropuncture needle was used access the right common femoral artery under ultrasound. With excellent arterial blood flow returned, an .018 micro wire was passed through the needle, observed to enter the abdominal aorta under fluoroscopy. The needle was removed, and a micropuncture sheath was placed over the wire. The inner dilator and wire were removed, and an 035 wire was advanced under fluoroscopy into the abdominal aorta. The sheath was removed and a 25cm 67F straight vascular sheath was placed. The dilator was removed and the sheath was flushed. Sheath was attached to pressurized and heparinized saline bag for constant forward flow. A coaxial system was then advanced over the 035 wire. This included a 80cm Neuron Max with coaxial 100cm JB1 diagnostic catheter. This was advanced to the proximal descending thoracic aorta. Wire was then removed. Double flush of the catheter was performed. Catheter was then used to select the left subclavian artery. Angiogram was performed. Using roadmap technique, the catheter was advanced over a standard glide wire into left vertebral, with distal position achieved of the diagnostic catheter. The Neuron max catheter was then advanced to the distal vertebral artery, and the diagnostic catheter and the glide wire were removed. Double flush was performed to clear the guide. Formal angiogram was performed. Road map function was used once the occluded vessel was identified. Copious back flush was performed and the balloon catheter was attached to heparinized and pressurized saline bag for forward flow. A second coaxial system was then advanced through the balloon  catheter, which included the selected intermediate catheter, microcatheter, and microwire. In this scenario, the set up included a 137cm Zoom 55 intermediate catheter, a 160cm 021 lumen microcatheter, and 014 synchro soft wire. This system was advanced through the NeuronMax catheter under  the road-map function, with adequate back-flush at the rotating hemostatic valve at that back end of the balloon guide. Microcatheter and the intermediate catheter system were advanced to the level of the occlusion. Microcatheter and microwire were then removed, as well as the rotating hemostatic valve on the zoom catheter. The suction engine was then attached to the hub of the Zoom catheter and direct aspiration at the face of the clot was performed. We turned on the vacuum engine, confirming flow, then gently advanced the aspiration catheter into the occlusion. Stasis of flow was observed. We then observed a 3 minute interval. The cather was then pushed through the occlusion to the distal basilar artery, and then the Zoom catheter was withdrawn into the Neuron Max. Aspiration was returning adequate flow at this time. The engine was removed from the hub of the zoom catheter, double flush was performed confirming adequate return of flow, and angiogram was performed. Angiogram demonstrated restoration of flow through the segment, with 50% narrowing in the mid segment of the basilar artery, at the site of the previous occlusion. There was irregular filling defect at this site, most likely representing the site of unstable plaque rupture/intracranial atherosclerotic disease. At this time we elected to observe the lesion, for potential rethrombosis and need for rescue stenting. During this observation time we attempted placement of orogastric tube. Anesthesia tried on 3 attempts to pass orogastric tube to the stomach, only able to pass the gastric tube to the distal esophagus. We were unable to confirm placement below the diaphragm,  and were thus unable to use the gastric tube. This took approximately 10 minutes of working time. Angiogram at the end of this working time then demonstrated reocclusion at the mid basilar artery. Zoom catheter was advanced gently with hand aspiration through the site of occlusion, and then withdrawn, which restored flow. We then prepared for rescue stenting. Parenteral antiplatelets were initiated with a bolus and drip of Kangrelor. Measurements were made of the mid basilar, and we elected to proceed with balloon mounted stenting with 2.5 mm x 8 mm resolute onyx coronary stent. The zoom catheter was removed. Trevo microcatheter and the synchro soft were navigated into the left PCA. Microcatheter was then used to deliver an exchange length transcend floppy tip wire. Once the wire was in position the catheter was removed. We then proceeded with balloon mounted stenting in the mid basilar segment. Ten atmosphere of inflation was observed, corresponding to 2.45 mm diameter inflation. Delivery device was removed and repeat angiogram was performed. All catheters and wires were removed. The skin at the puncture site was then cleaned with Chlorhexidine. The 8 French sheath was removed and an 39F angioseal was deployed. Flat panel CT was performed. Patient remained intubated given the pretreatment clinical status and basilar occlusion. Patient tolerated the procedure well and remained hemodynamically stable throughout. No complications were encountered and no significant blood loss encountered. 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 Angio-Seal for hemostasis Signed, Yvone Neu. Reyne Dumas, RPVI [facility] Radiology PLAN: The patient will remain  intubated, given clinical status before treatment ICU status Target systolic blood pressure of 120-140 Right hip straight time 6 hours Frequent neurovascular checks Repeat neurologic imaging with CT and/MRI at the discretion of neurology team The IV kangrelor will be terminated after a total  time of 2 hours per protocol, 6:06 p.m. The patient will receive rectal 600 mg aspirin upon arrival in the ICU Daily aspirin suppositories until a more durable method of p.o. antiplatelets is available, versus patient ability to swallow Electronically Signed   By: Gilmer Mor D.O.   On: 12/04/2020 09:22   IR CT Head Ltd  Result Date: 12/04/2020 INDICATION: 65 year old male presents with acute basilar occlusion for mechanical thrombectomy EXAM: ULTRASOUND-GUIDED ACCESS RIGHT COMMON FEMORAL ARTERY CERVICAL AND CEREBRAL ANGIOGRAM MECHANICAL THROMBECTOMY BASILAR ARTERY RESCUE STENT UNSTABLE PLAQUE BASILAR ARTERY ANGIO-SEAL DEPLOYED FOR HEMOSTASIS COMPARISON:  CT imaging of the same day MEDICATIONS: Intravenous kangrelor bolus and initiation of drip ANESTHESIA/SEDATION: The anesthesia team was present to provide general endotracheal tube anesthesia and for patient monitoring during the procedure. Intubation was performed before arrival to the neuro angio suite. Left radial arterial line was performed by the anesthesia team. Interventional neuro radiology nursing staff was also present. CONTRAST:  200 cc FLUOROSCOPY TIME:  Fluoroscopy Time: 23 minutes 0 seconds (2,527 mGy). COMPLICATIONS: None TECHNIQUE: Emergent consent was implied, with assumed procedural risks, benefits and alternatives. Specific risks include: Bleeding, infection, contrast reaction, kidney injury/failure, need for further procedure/surgery, arterial injury or dissection, embolization to new territory, intracranial hemorrhage (10-15% risk), neurologic deterioration, cardiopulmonary collapse, death. Maximal Sterile Barrier Technique was utilized including  during the procedure including caps, mask, sterile gowns, sterile gloves, sterile drape, hand hygiene and skin antiseptic. A timeout was performed prior to the initiation of the procedure. The anesthesia team was present to provide general endotracheal tube anesthesia and for patient monitoring during the procedure. Interventional neuro radiology nursing staff was also present. FINDINGS: Initial Findings: Left vertebral artery: Normal course caliber and contour. Basilar artery: Short segment critical stenosis within the mid basilar artery, corresponding to findings on the preoperative CT angiogram. Contrast penetrates beyond the occlusion, but with minimal filling of the territory beyond the P1 arteries and superior cerebellar arteries. There is a fairly rounded luminal filling defect at the region of stenosis, with the most likely diagnosis being unstable ruptured atherosclerotic plaque. Initial TICI flow: TICI 1. Mild irregularity in the proximal basilar artery and the right vertebral artery compatible with intracranial atherosclerotic changes. Completion Findings: After the initial aspiration thrombectomy there is complete restoration of flow through the segment, with adequate distal perfusion, compatible with TICI 3: Complete perfusion of the territory. After observation for 5 minutes-10 minutes there is reocclusion at the unstable plaque site. A second aspiration thrombectomy reopened this segment, and then rescue stenting was performed with balloon mounted 2.5 mm x 8 mm on X resolution stent, as described in the procedural note. After the stent was deployed there was again restoration of complete flow through the segment, achieving TICI 3: Complete perfusion of the territory Completion TICI: TICI 3 Flat panel CT: No acute intracranial hemorrhage. No midline shift or mass effect after reperfusion. No contrast staining of the affected territory observed. PROCEDURE: The anesthesia team was present to provide  general endotracheal tube anesthesia and for patient monitoring during the procedure. Intubation was performed in negative pressure [place] in neuro IR holding. Interventional neuro radiology nursing staff was also present. Ultrasound survey of the right inguinal region was performed with images stored and sent to PACs. 11 blade scalpel was used to make a small incision. Blunt dissection was performed with US guidance. A micropuncture needle was used access the right common femoral artery under ultrasound. With excellent arterial blood flow returned, an .018 micro wire was passed through  the needle, observed to enter the abdominal aorta under fluoroscopy. The needle was removed, and a micropuncture sheath was placed over the wire. The inner dilator and wire were removed, and an 035 wire was advanced under fluoroscopy into the abdominal aorta. The sheath was removed and a 25cm 9F straight vascular sheath was placed. The dilator was removed and the sheath was flushed. Sheath was attached to pressurized and heparinized saline bag for constant forward flow. A coaxial system was then advanced over the 035 wire. This included a 80cm Neuron Max with coaxial 100cm JB1 diagnostic catheter. This was advanced to the proximal descending thoracic aorta. Wire was then removed. Double flush of the catheter was performed. Catheter was then used to select the left subclavian artery. Angiogram was performed. Using roadmap technique, the catheter was advanced over a standard glide wire into left vertebral, with distal position achieved of the diagnostic catheter. The Neuron max catheter was then advanced to the distal vertebral artery, and the diagnostic catheter and the glide wire were removed. Double flush was performed to clear the guide. Formal angiogram was performed. Road map function was used once the occluded vessel was identified. Copious back flush was performed and the balloon catheter was attached to heparinized and  pressurized saline bag for forward flow. A second coaxial system was then advanced through the balloon catheter, which included the selected intermediate catheter, microcatheter, and microwire. In this scenario, the set up included a 137cm Zoom 55 intermediate catheter, a 160cm 021 lumen microcatheter, and 014 synchro soft wire. This system was advanced through the NeuronMax catheter under the road-map function, with adequate back-flush at the rotating hemostatic valve at that back end of the balloon guide. Microcatheter and the intermediate catheter system were advanced to the level of the occlusion. Microcatheter and microwire were then removed, as well as the rotating hemostatic valve on the zoom catheter. The suction engine was then attached to the hub of the Zoom catheter and direct aspiration at the face of the clot was performed. We turned on the vacuum engine, confirming flow, then gently advanced the aspiration catheter into the occlusion. Stasis of flow was observed. We then observed a 3 minute interval. The cather was then pushed through the occlusion to the distal basilar artery, and then the Zoom catheter was withdrawn into the Neuron Max. Aspiration was returning adequate flow at this time. The engine was removed from the hub of the zoom catheter, double flush was performed confirming adequate return of flow, and angiogram was performed. Angiogram demonstrated restoration of flow through the segment, with 50% narrowing in the mid segment of the basilar artery, at the site of the previous occlusion. There was irregular filling defect at this site, most likely representing the site of unstable plaque rupture/intracranial atherosclerotic disease. At this time we elected to observe the lesion, for potential rethrombosis and need for rescue stenting. During this observation time we attempted placement of orogastric tube. Anesthesia tried on 3 attempts to pass orogastric tube to the stomach, only able to  pass the gastric tube to the distal esophagus. We were unable to confirm placement below the diaphragm, and were thus unable to use the gastric tube. This took approximately 10 minutes of working time. Angiogram at the end of this working time then demonstrated reocclusion at the mid basilar artery. Zoom catheter was advanced gently with hand aspiration through the site of occlusion, and then withdrawn, which restored flow. We then prepared for rescue stenting. Parenteral antiplatelets were initiated with  a bolus and drip of Kangrelor. Measurements were made of the mid basilar, and we elected to proceed with balloon mounted stenting with 2.5 mm x 8 mm resolute onyx coronary stent. The zoom catheter was removed. Trevo microcatheter and the synchro soft were navigated into the left PCA. Microcatheter was then used to deliver an exchange length transcend floppy tip wire. Once the wire was in position the catheter was removed. We then proceeded with balloon mounted stenting in the mid basilar segment. Ten atmosphere of inflation was observed, corresponding to 2.45 mm diameter inflation. Delivery device was removed and repeat angiogram was performed. All catheters and wires were removed. The skin at the puncture site was then cleaned with Chlorhexidine. The 8 French sheath was removed and an 49F angioseal was deployed. Flat panel CT was performed. Patient remained intubated given the pretreatment clinical status and basilar occlusion. Patient tolerated the procedure well and remained hemodynamically stable throughout. No complications were encountered and no significant blood loss encountered. 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 Angio-Seal for hemostasis Signed, Yvone Neu. Reyne Dumas, RPVI  [facility] Radiology PLAN: The patient will remain intubated, given clinical status before treatment ICU status Target systolic blood pressure of 120-140 Right hip straight time 6 hours Frequent neurovascular checks Repeat neurologic imaging with CT and/MRI at the discretion of neurology team The IV kangrelor will be terminated after a total time of 2 hours per protocol, 6:06 p.m. The patient will receive rectal 600 mg aspirin upon arrival in the ICU Daily aspirin suppositories until a more durable method of p.o. antiplatelets is available, versus patient ability to swallow Electronically Signed   By: Gilmer Mor D.O.   On: 12/04/2020 09:22   IR US Guide Vasc Access Right  Result Date: 12/04/2020 INDICATION: 65 year old male presents with acute basilar occlusion for mechanical thrombectomy EXAM: ULTRASOUND-GUIDED ACCESS RIGHT COMMON FEMORAL ARTERY CERVICAL AND CEREBRAL ANGIOGRAM MECHANICAL THROMBECTOMY BASILAR ARTERY RESCUE STENT UNSTABLE PLAQUE BASILAR ARTERY ANGIO-SEAL DEPLOYED FOR HEMOSTASIS COMPARISON:  CT imaging of the same day MEDICATIONS: Intravenous kangrelor bolus and initiation of drip ANESTHESIA/SEDATION: The anesthesia team was present to provide general endotracheal tube anesthesia and for patient monitoring during the procedure. Intubation was performed before arrival to the neuro angio suite. Left radial arterial line was performed by the anesthesia team. Interventional neuro radiology nursing staff was also present. CONTRAST:  200 cc FLUOROSCOPY TIME:  Fluoroscopy Time: 23 minutes 0 seconds (2,527 mGy). COMPLICATIONS: None TECHNIQUE: Emergent consent was implied, with assumed procedural risks, benefits and alternatives. Specific risks include: Bleeding, infection, contrast reaction, kidney injury/failure, need for further procedure/surgery, arterial injury or dissection, embolization to new territory, intracranial hemorrhage (10-15% risk),  neurologic deterioration, cardiopulmonary collapse, death. Maximal Sterile Barrier Technique was utilized including during the procedure including caps, mask, sterile gowns, sterile gloves, sterile drape, hand hygiene and skin antiseptic. A timeout was performed prior to the initiation of the procedure. The anesthesia team was present to provide general endotracheal tube anesthesia and for patient monitoring during the procedure. Interventional neuro radiology nursing staff was also present. FINDINGS: Initial Findings: Left vertebral artery: Normal course caliber and contour. Basilar artery: Short segment critical stenosis within the mid basilar artery, corresponding to findings on the preoperative CT angiogram. Contrast penetrates beyond the occlusion, but with minimal filling of the territory  beyond the P1 arteries and superior cerebellar arteries. There is a fairly rounded luminal filling defect at the region of stenosis, with the most likely diagnosis being unstable ruptured atherosclerotic plaque. Initial TICI flow: TICI 1. Mild irregularity in the proximal basilar artery and the right vertebral artery compatible with intracranial atherosclerotic changes. Completion Findings: After the initial aspiration thrombectomy there is complete restoration of flow through the segment, with adequate distal perfusion, compatible with TICI 3: Complete perfusion of the territory. After observation for 5 minutes-10 minutes there is reocclusion at the unstable plaque site. A second aspiration thrombectomy reopened this segment, and then rescue stenting was performed with balloon mounted 2.5 mm x 8 mm on X resolution stent, as described in the procedural note. After the stent was deployed there was again restoration of complete flow through the segment, achieving TICI 3: Complete perfusion of the territory Completion TICI: TICI 3 Flat panel CT: No acute intracranial hemorrhage. No midline shift or mass effect after reperfusion.  No contrast staining of the affected territory observed. PROCEDURE: The anesthesia team was present to provide general endotracheal tube anesthesia and for patient monitoring during the procedure. Intubation was performed in negative pressure [place] in neuro IR holding. Interventional neuro radiology nursing staff was also present. Ultrasound survey of the right inguinal region was performed with images stored and sent to PACs. 11 blade scalpel was used to make a small incision. Blunt dissection was performed with US guidance. A micropuncture needle was used access the right common femoral artery under ultrasound. With excellent arterial blood flow returned, an .018 micro wire was passed through the needle, observed to enter the abdominal aorta under fluoroscopy. The needle was removed, and a micropuncture sheath was placed over the wire. The inner dilator and wire were removed, and an 035 wire was advanced under fluoroscopy into the abdominal aorta. The sheath was removed and a 25cm 46F straight vascular sheath was placed. The dilator was removed and the sheath was flushed. Sheath was attached to pressurized and heparinized saline bag for constant forward flow. A coaxial system was then advanced over the 035 wire. This included a 80cm Neuron Max with coaxial 100cm JB1 diagnostic catheter. This was advanced to the proximal descending thoracic aorta. Wire was then removed. Double flush of the catheter was performed. Catheter was then used to select the left subclavian artery. Angiogram was performed. Using roadmap technique, the catheter was advanced over a standard glide wire into left vertebral, with distal position achieved of the diagnostic catheter. The Neuron max catheter was then advanced to the distal vertebral artery, and the diagnostic catheter and the glide wire were removed. Double flush was performed to clear the guide. Formal angiogram was performed. Road map function was used once the occluded vessel was  identified. Copious back flush was performed and the balloon catheter was attached to heparinized and pressurized saline bag for forward flow. A second coaxial system was then advanced through the balloon catheter, which included the selected intermediate catheter, microcatheter, and microwire. In this scenario, the set up included a 137cm Zoom 55 intermediate catheter, a 160cm 021 lumen microcatheter, and 014 synchro soft wire. This system was advanced through the NeuronMax catheter under the road-map function, with adequate back-flush at the rotating hemostatic valve at that back end of the balloon guide. Microcatheter and the intermediate catheter system were advanced to the level of the occlusion. Microcatheter and microwire were then removed, as well as the rotating hemostatic valve on the zoom catheter. The suction  engine was then attached to the hub of the Zoom catheter and direct aspiration at the face of the clot was performed. We turned on the vacuum engine, confirming flow, then gently advanced the aspiration catheter into the occlusion. Stasis of flow was observed. We then observed a 3 minute interval. The cather was then pushed through the occlusion to the distal basilar artery, and then the Zoom catheter was withdrawn into the Neuron Max. Aspiration was returning adequate flow at this time. The engine was removed from the hub of the zoom catheter, double flush was performed confirming adequate return of flow, and angiogram was performed. Angiogram demonstrated restoration of flow through the segment, with 50% narrowing in the mid segment of the basilar artery, at the site of the previous occlusion. There was irregular filling defect at this site, most likely representing the site of unstable plaque rupture/intracranial atherosclerotic disease. At this time we elected to observe the lesion, for potential rethrombosis and need for rescue stenting. During this observation time we attempted placement of  orogastric tube. Anesthesia tried on 3 attempts to pass orogastric tube to the stomach, only able to pass the gastric tube to the distal esophagus. We were unable to confirm placement below the diaphragm, and were thus unable to use the gastric tube. This took approximately 10 minutes of working time. Angiogram at the end of this working time then demonstrated reocclusion at the mid basilar artery. Zoom catheter was advanced gently with hand aspiration through the site of occlusion, and then withdrawn, which restored flow. We then prepared for rescue stenting. Parenteral antiplatelets were initiated with a bolus and drip of Kangrelor. Measurements were made of the mid basilar, and we elected to proceed with balloon mounted stenting with 2.5 mm x 8 mm resolute onyx coronary stent. The zoom catheter was removed. Trevo microcatheter and the synchro soft were navigated into the left PCA. Microcatheter was then used to deliver an exchange length transcend floppy tip wire. Once the wire was in position the catheter was removed. We then proceeded with balloon mounted stenting in the mid basilar segment. Ten atmosphere of inflation was observed, corresponding to 2.45 mm diameter inflation. Delivery device was removed and repeat angiogram was performed. All catheters and wires were removed. The skin at the puncture site was then cleaned with Chlorhexidine. The 8 French sheath was removed and an 52F angioseal was deployed. Flat panel CT was performed. Patient remained intubated given the pretreatment clinical status and basilar occlusion. Patient tolerated the procedure well and remained hemodynamically stable throughout. No complications were encountered and no significant blood loss encountered. 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 Angio-Seal for hemostasis Signed, Yvone Neu. Reyne Dumas, RPVI [facility] Radiology PLAN: The patient will remain intubated, given clinical status before treatment ICU status Target systolic blood pressure of 120-140 Right hip straight time 6 hours Frequent neurovascular checks Repeat neurologic imaging with CT and/MRI at the discretion of neurology team The IV kangrelor will be terminated after a total time of 2 hours per protocol, 6:06 p.m. The patient will receive rectal 600 mg aspirin upon arrival in the ICU Daily aspirin suppositories until a more durable method of p.o. antiplatelets is available, versus patient ability to swallow Electronically Signed   By: Gilmer Mor D.O.   On: 12/04/2020  09:22   DG Chest Port 1 View  Result Date: 12/03/2020 CLINICAL DATA:  Acute respiratory failure. Endotracheally intubated. EXAM: PORTABLE CHEST 1 VIEW COMPARISON:  None. FINDINGS: Endotracheal tube is seen in place, with distal tip approximately 4.5 cm above the carina. Low lung volumes are noted. Right upper lobe collapse is seen as well as atelectasis or infiltrate in the left lower lobe. No evidence of pneumothorax. IMPRESSION: Endotracheal tube in appropriate position. Right upper lobe collapse, and left lower lobe atelectasis versus infiltrate. Electronically Signed   By: Danae Orleans M.D.   On: 12/03/2020 18:37   DG Abd Portable 1V  Result Date: 12/04/2020 CLINICAL DATA:  Feeding tube placement EXAM: PORTABLE ABDOMEN - 1 VIEW COMPARISON:  None. FINDINGS: Feeding tube tip in the mid to distal stomach. Nonobstructive bowel gas pattern. IMPRESSION: Feeding tube tip in the mid to distal stomach. Electronically Signed   By: Charlett Nose M.D.   On: 12/04/2020 10:58   IR PERCUTANEOUS ART THROMBECTOMY/INFUSION INTRACRANIAL INC DIAG ANGIO  Result Date: 12/04/2020 INDICATION: 65 year old male presents with acute basilar occlusion  for mechanical thrombectomy EXAM: ULTRASOUND-GUIDED ACCESS RIGHT COMMON FEMORAL ARTERY CERVICAL AND CEREBRAL ANGIOGRAM MECHANICAL THROMBECTOMY BASILAR ARTERY RESCUE STENT UNSTABLE PLAQUE BASILAR ARTERY ANGIO-SEAL DEPLOYED FOR HEMOSTASIS COMPARISON:  CT imaging of the same day MEDICATIONS: Intravenous kangrelor bolus and initiation of drip ANESTHESIA/SEDATION: The anesthesia team was present to provide general endotracheal tube anesthesia and for patient monitoring during the procedure. Intubation was performed before arrival to the neuro angio suite. Left radial arterial line was performed by the anesthesia team. Interventional neuro radiology nursing staff was also present. CONTRAST:  200 cc FLUOROSCOPY TIME:  Fluoroscopy Time: 23 minutes 0 seconds (2,527 mGy). COMPLICATIONS: None TECHNIQUE: Emergent consent was implied, with assumed procedural risks, benefits and alternatives. Specific risks include: Bleeding, infection, contrast reaction, kidney injury/failure, need for further procedure/surgery, arterial injury or dissection, embolization to new territory, intracranial hemorrhage (10-15% risk), neurologic deterioration, cardiopulmonary collapse, death. Maximal Sterile Barrier Technique was utilized including during the procedure including caps, mask, sterile gowns, sterile gloves, sterile drape, hand hygiene and skin antiseptic. A timeout was performed prior to the initiation of the procedure. The anesthesia team was present to provide general endotracheal tube anesthesia and for patient monitoring during the procedure. Interventional neuro radiology nursing staff was also present. FINDINGS: Initial Findings: Left vertebral artery: Normal course caliber and contour. Basilar artery: Short segment critical stenosis within the mid basilar artery, corresponding to findings on the preoperative CT angiogram. Contrast penetrates beyond the occlusion, but with minimal filling of the territory beyond the P1 arteries and  superior cerebellar arteries. There is a fairly rounded luminal filling defect at the region of stenosis, with the most likely diagnosis being unstable ruptured atherosclerotic plaque. Initial TICI flow: TICI 1. Mild irregularity in the proximal basilar artery and the right vertebral artery compatible with intracranial atherosclerotic changes. Completion Findings: After the initial aspiration thrombectomy there is complete restoration of flow through the segment, with adequate distal perfusion, compatible with TICI 3: Complete perfusion of the territory. After observation for 5 minutes-10 minutes there is reocclusion at the unstable plaque site. A second aspiration thrombectomy reopened this segment, and then rescue stenting was performed with balloon mounted 2.5 mm x 8 mm on X resolution stent, as described in the procedural note. After the stent was deployed there was again restoration of complete flow through the segment, achieving TICI 3: Complete perfusion of the territory Completion TICI: TICI 3 Flat panel CT: No acute  intracranial hemorrhage. No midline shift or mass effect after reperfusion. No contrast staining of the affected territory observed. PROCEDURE: The anesthesia team was present to provide general endotracheal tube anesthesia and for patient monitoring during the procedure. Intubation was performed in negative pressure [place] in neuro IR holding. Interventional neuro radiology nursing staff was also present. Ultrasound survey of the right inguinal region was performed with images stored and sent to PACs. 11 blade scalpel was used to make a small incision. Blunt dissection was performed with US guidance. A micropuncture needle was used access the right common femoral artery under ultrasound. With excellent arterial blood flow returned, an .018 micro wire was passed through the needle, observed to enter the abdominal aorta under fluoroscopy. The needle was removed, and a micropuncture sheath was placed  over the wire. The inner dilator and wire were removed, and an 035 wire was advanced under fluoroscopy into the abdominal aorta. The sheath was removed and a 25cm 55F straight vascular sheath was placed. The dilator was removed and the sheath was flushed. Sheath was attached to pressurized and heparinized saline bag for constant forward flow. A coaxial system was then advanced over the 035 wire. This included a 80cm Neuron Max with coaxial 100cm JB1 diagnostic catheter. This was advanced to the proximal descending thoracic aorta. Wire was then removed. Double flush of the catheter was performed. Catheter was then used to select the left subclavian artery. Angiogram was performed. Using roadmap technique, the catheter was advanced over a standard glide wire into left vertebral, with distal position achieved of the diagnostic catheter. The Neuron max catheter was then advanced to the distal vertebral artery, and the diagnostic catheter and the glide wire were removed. Double flush was performed to clear the guide. Formal angiogram was performed. Road map function was used once the occluded vessel was identified. Copious back flush was performed and the balloon catheter was attached to heparinized and pressurized saline bag for forward flow. A second coaxial system was then advanced through the balloon catheter, which included the selected intermediate catheter, microcatheter, and microwire. In this scenario, the set up included a 137cm Zoom 55 intermediate catheter, a 160cm 021 lumen microcatheter, and 014 synchro soft wire. This system was advanced through the NeuronMax catheter under the road-map function, with adequate back-flush at the rotating hemostatic valve at that back end of the balloon guide. Microcatheter and the intermediate catheter system were advanced to the level of the occlusion. Microcatheter and microwire were then removed, as well as the rotating hemostatic valve on the zoom catheter. The suction  engine was then attached to the hub of the Zoom catheter and direct aspiration at the face of the clot was performed. We turned on the vacuum engine, confirming flow, then gently advanced the aspiration catheter into the occlusion. Stasis of flow was observed. We then observed a 3 minute interval. The cather was then pushed through the occlusion to the distal basilar artery, and then the Zoom catheter was withdrawn into the Neuron Max. Aspiration was returning adequate flow at this time. The engine was removed from the hub of the zoom catheter, double flush was performed confirming adequate return of flow, and angiogram was performed. Angiogram demonstrated restoration of flow through the segment, with 50% narrowing in the mid segment of the basilar artery, at the site of the previous occlusion. There was irregular filling defect at this site, most likely representing the site of unstable plaque rupture/intracranial atherosclerotic disease. At this time we elected to observe  the lesion, for potential rethrombosis and need for rescue stenting. During this observation time we attempted placement of orogastric tube. Anesthesia tried on 3 attempts to pass orogastric tube to the stomach, only able to pass the gastric tube to the distal esophagus. We were unable to confirm placement below the diaphragm, and were thus unable to use the gastric tube. This took approximately 10 minutes of working time. Angiogram at the end of this working time then demonstrated reocclusion at the mid basilar artery. Zoom catheter was advanced gently with hand aspiration through the site of occlusion, and then withdrawn, which restored flow. We then prepared for rescue stenting. Parenteral antiplatelets were initiated with a bolus and drip of Kangrelor. Measurements were made of the mid basilar, and we elected to proceed with balloon mounted stenting with 2.5 mm x 8 mm resolute onyx coronary stent. The zoom catheter was removed. Trevo  microcatheter and the synchro soft were navigated into the left PCA. Microcatheter was then used to deliver an exchange length transcend floppy tip wire. Once the wire was in position the catheter was removed. We then proceeded with balloon mounted stenting in the mid basilar segment. Ten atmosphere of inflation was observed, corresponding to 2.45 mm diameter inflation. Delivery device was removed and repeat angiogram was performed. All catheters and wires were removed. The skin at the puncture site was then cleaned with Chlorhexidine. The 8 French sheath was removed and an 41F angioseal was deployed. Flat panel CT was performed. Patient remained intubated given the pretreatment clinical status and basilar occlusion. Patient tolerated the procedure well and remained hemodynamically stable throughout. No complications were encountered and no significant blood loss encountered. 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 Angio-Seal for hemostasis Signed, Yvone Neu. Reyne Dumas, RPVI [facility] Radiology PLAN: The patient will remain intubated, given clinical status before treatment ICU status Target systolic blood pressure of 120-140 Right hip straight time 6 hours Frequent neurovascular checks Repeat neurologic imaging with CT and/MRI at the discretion of neurology team The IV kangrelor will be terminated after a total time of 2 hours per protocol, 6:06 p.m. The patient will receive rectal 600 mg aspirin upon arrival in the ICU Daily aspirin suppositories until a more durable method of p.o. antiplatelets is available, versus patient ability to swallow Electronically Signed   By: Gilmer Mor D.O.   On: 12/04/2020 09:22   CT HEAD CODE STROKE WO  CONTRAST  Addendum Date: 12/03/2020   ADDENDUM REPORT: 12/03/2020 14:11 ADDENDUM: Suspected small age-indeterminate infarct within the right cerebellar hemisphere (series 6, image 18) (series 5, images 50 and 51). These results were called by telephone at the time of interpretation on 12/03/2020 at 1:42 PM to provider Dr. Selina Cooley, who verbally acknowledged these results. Electronically Signed   By: Jackey Loge D.O.   On: 12/03/2020 14:11   Result Date: 12/03/2020 CLINICAL DATA:  Code stroke. Neuro deficit, acute, stroke suspected. Altered mental status. Last known well 10:30 a.m. EXAM: CT HEAD WITHOUT CONTRAST TECHNIQUE: Contiguous axial images were obtained from the base of the skull through the vertex without intravenous contrast. COMPARISON:  None FINDINGS: Brain: Mild generalized cerebral and cerebellar atrophy. Mild patchy and ill-defined hypoattenuation within the cerebral white matter, nonspecific but compatible with chronic small vessel ischemic disease. Age-indeterminate lacunar infarct within the  central pons (series 3, image 10) (series 5, image 39) (series 6, image 28). There is no acute intracranial hemorrhage. No acute demarcated cortical infarct. No extra-axial fluid collection. No evidence of an intracranial mass. No midline shift. Vascular: No hyperdense vessel.  Atherosclerotic calcifications. Skull: Normal. Negative for fracture or focal lesion. Sinuses/Orbits: Visualized orbits show no acute finding. Trace scattered paranasal sinus mucosal thickening at the imaged levels. ASPECTS ([place]berta Stroke Program Early CT Score) - Ganglionic level infarction (caudate, lentiform nuclei, internal capsule, insula, M1-M3 cortex): 7 - Supraganglionic infarction (M4-M6 cortex): 3 Total score (0-10 with 10 being normal): 10 These results were called by telephone at the time of interpretation on 12/03/2020 at 1:19 pm to provider Dr. Selina Cooley, who verbally acknowledged these results. IMPRESSION: No evidence of acute  intracranial hemorrhage or acute demarcated cortical infarction. Age-indeterminate lacunar infarct within the central pons. Mild chronic small-vessel ischemic changes within the cerebral white matter. Mild generalized parenchymal atrophy. Electronically Signed: By: Jackey Loge D.O. On: 12/03/2020 13:26   CT ANGIO HEAD NECK W WO CM (CODE STROKE)  Result Date: 12/03/2020 CLINICAL DATA:  Neuro deficit, acute, stroke suspected. Altered mental status. EXAM: CT ANGIOGRAPHY HEAD AND NECK TECHNIQUE: Multidetector CT imaging of the head and neck was performed using the standard protocol during bolus administration of intravenous contrast. Multiplanar CT image reconstructions and MIPs were obtained to evaluate the vascular anatomy. Carotid stenosis measurements (when applicable) are obtained utilizing NASCET criteria, using the distal internal carotid diameter as the denominator. CONTRAST:  52mL OMNIPAQUE IOHEXOL 350 MG/ML SOLN COMPARISON:  Noncontrast head CT performed earlier today 12/03/2020. FINDINGS: CTA NECK FINDINGS Aortic arch: Standard aortic branching. Atherosclerotic plaque within the visualized aortic arch and proximal major branch vessels of the neck. Streak and beam hardening artifact arising from a dense right-sided contrast bolus partially obscures the right subclavian artery. Within this limitation, there is no hemodynamically significant innominate or proximal subclavian artery stenosis. Right carotid system: CCA and ICA patent within the neck. Soft and calcified plaque within the carotid bifurcation and proximal ICA. Apparent high-grade (greater than 70% stenosis within the proximal ICA (series 6, image 173). However, beam hardening artifact and vessel tortuosity limit evaluation of the vessel lumen at this site, and it is suspected that artifact may significantly accentuate this apparent stenosis. Left carotid system: CCA and ICA patent within the neck. Soft and calcified plaque within the carotid  bifurcation and proximal ICA. Calcified plaque results in less than 50% stenosis within the proximal ICA. Vertebral arteries: The right vertebral artery is non dominant. There is severe atherosclerotic stenosis at the origin of the right vertebral artery. The right vertebral artery appears occluded at the distal V2 segment level and at the level of the V2/V3 junction. There is reconstitution of this vessel intracranially, possibly due to retrograde flow. The dominant left vertebral artery is patent throughout the neck without stenosis. Skeleton: No acute bony abnormality or aggressive osseous lesion. Cervical spondylosis, advanced at C6-C7. Cervical levocurvature. Cervicothoracic dextrocurvature more inferiorly. Other neck: No neck mass or cervical lymphadenopathy. Upper chest: No consolidation within the imaged lung apices. Mild fluid distention of the mid to distal esophagus. Review of the MIP images confirms the above findings CTA HEAD FINDINGS Anterior circulation: The intracranial internal carotid arteries are patent. Calcified plaque within both vessels. Moderate stenosis within the cavernous internal carotid arteries bilaterally. The M1 middle cerebral arteries are patent. Atherosclerotic irregularity of the M2 and more distal middle cerebral artery vessels bilaterally. No M2 proximal branch occlusion  is identified. The anterior cerebral arteries are patent. No intracranial aneurysm is identified. Posterior circulation: Reconstitution of the V4 right vertebral artery intracranially, which may be due to retrograde flow. The dominant left vertebral artery is patent intracranially without stenosis. The basilar artery is patent proximally. However, there is a focal occlusion of the mid basilar artery measuring 7 mm in length. There is an apparent additional focal occlusion within the distal basilar artery. Enhancement is present within the basilar tip. The posterior cerebral arteries are patent. Both PCAs  demonstrated multifocal atherosclerotic irregularity. Most notably, there is a severe stenosis at the origin of the left posterior cerebral artery, and a severe stenosis within the P2 right posterior cerebral artery. Posterior communicating arteries are hypoplastic or absent bilaterally. Venous sinuses: Within the limitations of contrast timing, no convincing thrombus. Anatomic variants: As described Review of the MIP images confirms the above findings Findings of right vertebral artery occlusion and multifocal basilar artery occlusion called by telephone at the time of interpretation on 12/03/2020 at 1:42 pm to provider [facility] , who verbally acknowledged these results. IMPRESSION: CTA neck: 1. Severe atherosclerotic stenosis at the origin of the non-dominant right vertebral artery. The vertebral artery appears occluded at the distal V2 segment level, and at the level of the V2/V3 junction. There is reconstitution of the right vertebral artery more distally, possibly due to retrograde flow. 2. The left vertebral artery is dominant and patent throughout the neck without stenosis. 3. The common carotid and internal carotid arteries are patent within the neck. There is apparent high-grade (greater than 70%) focal stenosis of the proximal right ICA. However, beam hardening artifact and vessel tortuosity significantly limit evaluation of the vessel lumen at this site, and it is suspected that artifact may significantly accentuate this apparent stenosis. A carotid artery duplex examination is recommended for further assessment. Atherosclerotic plaque within the left carotid bifurcation and proximal ICA results in less than 50% stenosis within the proximal left ICA. 4. Mild fluid distention of the visualized mid-to-distal esophagus. Aspiration precautions advised. CTA head: 1. 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 Enhancement is present at the basilar tip. 2. Enhancement is present within the V4  right vertebral artery, which may be due to retrograde flow (given occlusion of this vessel more proximally). 3. 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 4. Severe stenosis at the origin of the left posterior cerebral artery. 5. Severe stenosis within the P2 right posterior cerebral artery. 6. Moderate stenosis within the cavernous internal carotid arteries, bilaterally. Electronically Signed   By: Jackey Loge D.O.   On: 12/03/2020 14:10    Labs:  CBC: Recent Labs    12/03/20 1302 12/03/20 1753 12/04/20 0516  WBC 13.5*  --  10.1  HGB 13.4 13.9 14.5  HCT 40.9 41.0 36.3*  PLT 298  --  308    COAGS: Recent Labs    12/03/20 1345 12/03/20 1753  INR 1.0 1.0  APTT 20* 29    BMP: Recent Labs    12/03/20 1345 12/03/20 1753 12/03/20 2235 12/04/20 0516  NA 133* 135 132* 130*  K 2.6* 3.1* 3.9 5.4*  CL 95*  --  95* 95*  CO2 26  --  25 22  GLUCOSE 203*  --  115* 141*  BUN 13  --  11 12  CALCIUM 8.9  --  8.5* 8.0*  CREATININE 1.10  --  0.79 1.05  GFRNONAA >60  --  >60 >60  LIVER FUNCTION TESTS: Recent Labs    12/03/20 1345 12/04/20 0516  BILITOT 0.6 2.6*  AST 30 RESULTS UNAVAILABLE DUE TO INTERFERING SUBSTANCE  ALT 25 RESULTS UNAVAILABLE DUE TO INTERFERING SUBSTANCE  ALKPHOS 72 59  PROT 7.3 RESULTS UNAVAILABLE DUE TO INTERFERING SUBSTANCE  ALBUMIN 3.9 3.2*    Assessment and Plan: 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  Working toward extubation today. On propofol for agitation but moving all extremities.  Planning for NGT/Cortrak placement today after inability to place in IR during case.  Patient receiving rectal aspirin with plan to give Brilinta post NGT placement.  IR following.   Electronically Signed: Hoyt Koch, PA 12/04/2020, 2:04 PM   I spent a total of 15  Minutes at the the patient's bedside AND on the patient's hospital floor or unit, greater than 50% of which was counseling/coordinating care for basilar artery occlusion.

## 2020-12-04 NOTE — Progress Notes (Signed)
Sedation decreased by 50% for spontaneous breathing trial per Dr Celine Mans and Dr Pearlean Brownie. Will continue to monitor patient.  Sherral Hammers RN

## 2020-12-04 NOTE — Progress Notes (Signed)
Pharmacy Antibiotic Note  Travis Williams is a 65 y.o. male admitted on 12/03/2020 with multifocal large intracranial occlusion s/p TNK transferred for thrombectomy from OSH. He is currently afebrile and WBC wnl (10.1). His trach aspirate (9/15) is pending culture. His CXR is positive for left lower lobe atelectasis/ infiltrate. He is being ruled out for aspiration pneumonia.  Pharmacy has been consulted for Unasyn dosing. Renal function: slight elevation in Scr.   Plan: Start Unasyn 3g IV Q6H. Monitor for s/sx of infection, renal function. F/U cultures.   Height: 5\' 11"  (180.3 cm) IBW/kg (Calculated) : 75.3  Temp (24hrs), Avg:98.6 F (37 C), Min:97.7 F (36.5 C), Max:99.8 F (37.7 C)  Recent Labs  Lab 12/03/20 1302 12/03/20 1345 12/03/20 2235 12/04/20 0516  WBC 13.5*  --   --  10.1  CREATININE  --  1.10 0.79 1.05    Estimated Creatinine Clearance: 83.3 mL/min (by C-G formula based on SCr of 1.05 mg/dL).    No Known Allergies  Antimicrobials this admission: 9/15 Zosyn 3.375g IV >> 9/15  Microbiology results: 9/15 Trach Asp: GPC, pending cx  9/15 MRSA PCR: Neg  Thank you for allowing pharmacy to be a part of this patient's care.  10/15, PharmD candidate 12/04/2020 9:22 AM

## 2020-12-04 NOTE — Progress Notes (Signed)
I just spoke to the cortrak team on the phone. They are in the middle of another placement but will come up as soon as possible.

## 2020-12-04 NOTE — Procedures (Signed)
Cortrak  Person Inserting Tube:  Rilla Buckman, RD Tube Type:  Cortrak - 43 inches Tube Size:  10 Tube Location:  Right nare Initial Placement:  Stomach Secured by: Bridle Technique Used to Measure Tube Placement:  Marking at nare/corner of mouth Cortrak Secured At:  61 cm Procedure Comments:  Cortrak Tube Team Note:  Consult received to place a Cortrak feeding tube.   X-ray is required, abdominal x-ray has been ordered by the Cortrak team. Please confirm tube placement before using the Cortrak tube.   If the tube becomes dislodged please keep the tube and contact the Cortrak team at [email or web address] ([email or web address]) for replacement.  If after hours and replacement cannot be delayed, place a NG tube and confirm placement with an abdominal x-ray.    Vanessa Kick MS, RD, LDN, CNSC Clinical Nutrition Pager listed in AMION

## 2020-12-04 NOTE — Progress Notes (Signed)
Per Dr. Celine Mans, turned patient off of propofol and only on 25 fentanyl gtt. Patient switched to wean mode and immediately the ventilator apnea alarm went off. Baird Lyons, RT at bedside. We will try again at 1100 to wean patient.   Sherral Hammers, RN

## 2020-12-04 NOTE — TOC CAGE-AID Note (Signed)
Transition of Care [facility]) - CAGE-AID Screening   Patient Details  Name: Travis Williams MRN: [id number] Date of Birth: 06/10/1955  [facility]) CM/SW Contact:    Mearl Latin, LCSW Phone Number: 12/04/2020, 11:40 AM   Clinical Narrative: Patient intubated and unable to participate in screening.    CAGE-AID Screening: Substance Abuse Screening unable to be completed due to: : Patient unable to participate

## 2020-12-04 NOTE — Anesthesia Postprocedure Evaluation (Signed)
Anesthesia Post Note  Patient: Travis Williams  Procedure(s) Performed: IR WITH ANESTHESIA     Patient location during evaluation: SICU Anesthesia Type: General Level of consciousness: sedated Pain management: pain level controlled Vital Signs Assessment: post-procedure vital signs reviewed and stable Respiratory status: patient remains intubated per anesthesia plan Cardiovascular status: stable Postop Assessment: no apparent nausea or vomiting Anesthetic complications: no   No notable events documented.  Last Vitals:  Vitals:   12/04/20 0743 12/04/20 0755  BP:    Pulse: (!) 111 (!) 112  Resp: 20 16  Temp:    SpO2: 96% 96%    Last Pain:  Vitals:   12/04/20 0400  TempSrc: Oral                 Gregg Winchell S

## 2020-12-04 NOTE — Progress Notes (Signed)
OT Cancellation Note  Patient Details Name: Travis Williams MRN: [id number] DOB: 1956/01/20   Cancelled Treatment:    Reason Eval/Treat Not Completed: Active bedrest order  [facility] Jhair Witherington, OT/L   Acute OT Clinical Specialist Acute Rehabilitation Services Pager [phone] Office [phone]  12/04/2020, 8:05 AM

## 2020-12-04 NOTE — Procedures (Signed)
Extubation Procedure Note  Patient Details:   Name: Travis Williams DOB: 11/10/1955 MRN: [id number]   Airway Documentation:    Vent end date: 12/04/20 Vent end time: 1129   Evaluation  O2 sats: stable throughout Complications: No apparent complications Patient did tolerate procedure well. Bilateral Breath Sounds: Clear, Diminished   Pt extubated to 4L [place] per MD order. Pt had positive cuff leak prior to extubation. No stridor noted. RT to continue to monitor.  Guss Bunde 12/04/2020, 11:29 AM

## 2020-12-05 ENCOUNTER — Inpatient Hospital Stay (HOSPITAL_COMMUNITY): Payer: Medicare HMO

## 2020-12-05 DIAGNOSIS — I639 Cerebral infarction, unspecified: Secondary | ICD-10-CM | POA: Diagnosis not present

## 2020-12-05 DIAGNOSIS — I679 Cerebrovascular disease, unspecified: Secondary | ICD-10-CM

## 2020-12-05 DIAGNOSIS — J690 Pneumonitis due to inhalation of food and vomit: Secondary | ICD-10-CM | POA: Diagnosis not present

## 2020-12-05 DIAGNOSIS — I6389 Other cerebral infarction: Secondary | ICD-10-CM

## 2020-12-05 DIAGNOSIS — R1312 Dysphagia, oropharyngeal phase: Secondary | ICD-10-CM

## 2020-12-05 DIAGNOSIS — E7800 Pure hypercholesterolemia, unspecified: Secondary | ICD-10-CM

## 2020-12-05 DIAGNOSIS — E876 Hypokalemia: Secondary | ICD-10-CM | POA: Diagnosis not present

## 2020-12-05 DIAGNOSIS — I10 Essential (primary) hypertension: Secondary | ICD-10-CM

## 2020-12-05 DIAGNOSIS — I160 Hypertensive urgency: Secondary | ICD-10-CM | POA: Diagnosis not present

## 2020-12-05 LAB — CBC
HCT: 34.1 % — ABNORMAL LOW (ref 39.0–52.0)
Hemoglobin: 11 g/dL — ABNORMAL LOW (ref 13.0–17.0)
MCH: 28.7 pg (ref 26.0–34.0)
MCHC: 32.3 g/dL (ref 30.0–36.0)
MCV: 89 fL (ref 80.0–100.0)
Platelets: 220 10*3/uL (ref 150–400)
RBC: 3.83 MIL/uL — ABNORMAL LOW (ref 4.22–5.81)
RDW: 13.8 % (ref 11.5–15.5)
WBC: 9.5 10*3/uL (ref 4.0–10.5)
nRBC: 0 % (ref 0.0–0.2)

## 2020-12-05 LAB — ECHOCARDIOGRAM COMPLETE
Area-P 1/2: 4.1 cm2
Height: 71 in
S' Lateral: 3.4 cm
Weight: 3520.31 oz

## 2020-12-05 LAB — BASIC METABOLIC PANEL
Anion gap: 11 (ref 5–15)
BUN: 13 mg/dL (ref 8–23)
CO2: 26 mmol/L (ref 22–32)
Calcium: 8.2 mg/dL — ABNORMAL LOW (ref 8.9–10.3)
Chloride: 99 mmol/L (ref 98–111)
Creatinine, Ser: 0.93 mg/dL (ref 0.61–1.24)
GFR, Estimated: >60 mL/min (ref >60)
Glucose, Bld: 159 mg/dL — ABNORMAL HIGH (ref 70–99)
Potassium: 3.2 mmol/L — ABNORMAL LOW (ref 3.5–5.1)
Sodium: 136 mmol/L (ref 135–145)

## 2020-12-05 LAB — MAGNESIUM
Magnesium: 2.4 mg/dL (ref 1.7–2.4)
Magnesium: 2.1 mg/dL (ref 1.7–2.4)

## 2020-12-05 LAB — TRIGLYCERIDES: Triglycerides: 205 mg/dL — ABNORMAL HIGH (ref <150)

## 2020-12-05 LAB — PHOSPHORUS
Phosphorus: 4.3 mg/dL (ref 2.5–4.6)
Phosphorus: 3 mg/dL (ref 2.5–4.6)

## 2020-12-05 MED ORDER — TICAGRELOR 90 MG PO TABS
90.0000 mg | ORAL_TABLET | Freq: Two times a day (BID) | ORAL | Status: DC
  Administered 2020-12-05 – 2020-12-06 (×2): 90 mg via ORAL
  Filled 2020-12-05 (×2): qty 1

## 2020-12-05 MED ORDER — LISINOPRIL 20 MG PO TABS: 20.0000 mg | ORAL_TABLET | Freq: Every day | ORAL | Status: DC

## 2020-12-05 MED ORDER — POTASSIUM CHLORIDE 20 MEQ PO PACK
40.0000 meq | PACK | ORAL | Status: DC
Start: 2020-12-05 — End: 2020-12-05
  Filled 2020-12-05: qty 2

## 2020-12-05 MED ORDER — LISINOPRIL 10 MG PO TABS
10.0000 mg | ORAL_TABLET | Freq: Every day | ORAL | Status: DC
  Administered 2020-12-05 – 2020-12-06 (×2): 10 mg via ORAL
  Filled 2020-12-05 (×2): qty 1

## 2020-12-05 MED ORDER — PANTOPRAZOLE 2 MG/ML SUSPENSION: 40.0000 mg | Freq: Every day | ORAL | Status: DC

## 2020-12-05 MED ORDER — AMLODIPINE BESYLATE 10 MG PO TABS
10.0000 mg | ORAL_TABLET | Freq: Every day | ORAL | Status: DC
  Administered 2020-12-05: 10 mg
  Filled 2020-12-05: qty 1

## 2020-12-05 MED ORDER — SODIUM CHLORIDE 0.9 % IV SOLN: INTRAVENOUS | Status: DC

## 2020-12-05 MED ORDER — ATORVASTATIN CALCIUM 40 MG PO TABS
40.0000 mg | ORAL_TABLET | Freq: Every day | ORAL | Status: DC
  Administered 2020-12-05 – 2020-12-06 (×2): 40 mg via ORAL
  Filled 2020-12-05: qty 1

## 2020-12-05 MED ORDER — AMLODIPINE BESYLATE 10 MG PO TABS
10.0000 mg | ORAL_TABLET | Freq: Every day | ORAL | Status: DC
  Administered 2020-12-06: 10 mg via ORAL
  Filled 2020-12-05: qty 1

## 2020-12-05 MED ORDER — ASPIRIN 81 MG PO CHEW
81.0000 mg | CHEWABLE_TABLET | Freq: Every day | ORAL | Status: DC
  Administered 2020-12-05: 81 mg
  Filled 2020-12-05: qty 1

## 2020-12-05 MED ORDER — POTASSIUM CHLORIDE 20 MEQ PO PACK
40.0000 meq | PACK | ORAL | Status: AC
  Administered 2020-12-05 (×2): 40 meq
  Filled 2020-12-05: qty 2

## 2020-12-05 MED ORDER — ASPIRIN 81 MG PO CHEW
81.0000 mg | CHEWABLE_TABLET | Freq: Every day | ORAL | Status: DC
  Administered 2020-12-06: 81 mg via ORAL
  Filled 2020-12-05: qty 1

## 2020-12-05 MED ORDER — LABETALOL HCL 5 MG/ML IV SOLN: 5.0000 mg | INTRAVENOUS | Status: DC | PRN

## 2020-12-05 NOTE — Progress Notes (Signed)
OT Cancellation Note  Patient Details Name: Travis Williams MRN: [id number] DOB: [birth year]   Cancelled Treatment:    Reason Eval/Treat Not Completed: Active bedrest order (HOB flat). Pt continues to have BR order with HOB flat. OT will continue to follow and will evaluate once BR orders are lifted.  Evern Bio Peniel Hass 12/05/2020, 8:09 AM  Nyoka Cowden OTR/L Acute Rehabilitation Services Pager: [phone] Office: [phone]

## 2020-12-05 NOTE — Progress Notes (Signed)
NAME:  Travis Williams, MRN:  [id number], DOB:  02/05/1956, LOS: 2 ADMISSION DATE:  12/03/2020, CONSULTATION DATE:  12/03/20 REFERRING MD:  Stroke CHIEF COMPLAINT:  Vent Management   History of Present Illness:  Travis Williams is a 65 y.o. male who only has PMH known of HTN.  He presented to [facility] 9/15 with R hemiplegia and slurred speech. Last known well at 1030 and found on floor at 1230CT head was negative. 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  He was given TNKase and then required intubation.  He was then transferred to [facility] and was taken to IR and had mechanical thrombectomy with rescue stenting of unstable plaque and mid basilar artery. Of note, IR was unable to pass an OGT due to possible stricture.  Post procedure, he was admitted to neuro ICU where PCCM was asked to assist with vent management.  Pertinent  Medical History:  has Ischemic stroke (HCC) on their problem list.  Significant Hospital Events: Including procedures, antibiotic start and stop dates in addition to other pertinent events   9/15 > admit. 9/15: bronch to eval RUL, s/p tnk and revascularization with IR 9/16: extubated  Interim History / Subjective:  On cleviprex this morning, getting prn labetalol pushes. Awake, alert, denies complaints.   Objective:  Blood pressure (!) 149/65, pulse 83, temperature 98.2 F (36.8 C), temperature source Oral, resp. rate 17, height 5\' 11"  (1.803 m), SpO2 98 %.    Vent Mode: PSV;CPAP FiO2 (%):  [40 %] 40 % PEEP:  [8 cmH20] 8 cmH20 Pressure Support:  [8 cmH20] 8 cmH20   Intake/Output Summary (Last 24 hours) at 12/05/2020 1032 Last data filed at 12/05/2020 0600 Gross per 24 hour  Intake 1893.16 ml  Output 1100 ml  Net 793.16 ml   There were no vitals filed for this visit.  Examination: General: resting comfortably, no distress Neuro: Aox4. Moves all 4 extremities, normal speech Cardiovascular: tachycardic, reegular, no mrg Lungs: ctab no  wheezes or crackles Abdomen: obese, soft,  Musculoskeletal: no edema Skin: no rashes appreciated GU: external catheter  Labs/imaging personally reviewed:  CTA head / neck 9/15 > right vertebral artery occlusion, high grade stenosis of prox R ICA, multifocal occlusion of basilar artery. MRI brain 9/16 > small infarcts of multiple areas, mo mass effect Echo 9/16 > completed, read pending  Resolved Hospital Problem list:  Respiratory failure  Assessment & Plan:   Multifocal basilar and R vertebral occlusion  - s/p TNKase followed by IR mechanical thrombectomy and rescue stenting of mid basilar artery. - Post procedure management per IR. - Stroke workup / management per neuro. - F/u on CT head, MRI brain, echo. -goal sbp 120-140  -BP management as below - continue DAPT - swallow eval. Can remove cortrak if passes.   Aspiration PNA - Bronchial hygiene. - cultures show GPC, mrsa negative. Will narrow abx to unasyn for total 5 days abx   Hypertension. - Cleviprex PRN for goal SBP 120 - 140 per neuro. - Hold home Chlorthalidone. - adding amlodipine today, titrated down cleviprex.  - continue prn labetalol  Hypokalemia. -replaced again today  Hyperglycemia. - SSI, likely stress induced - A2C 5.7  Best practice (evaluated daily):  Diet/type: NPO DVT prophylaxis: other GI prophylaxis: PPI Lines: N/A Foley:  N/A Code Status:  full code Last date of multidisciplinary goals of care discussion: 9/16 updated sister at bedside  The patient is critically ill due to hypertensive emergency, stroke.  Critical care was necessary  to treat or prevent imminent or life-threatening deterioration.  Critical care was time spent personally by me on the following activities: development of treatment plan with patient and/or surrogate as well as nursing, discussions with consultants, evaluation of patient's response to treatment, examination of patient, obtaining history from patient or  surrogate, ordering and performing treatments and interventions, ordering and review of laboratory studies, ordering and review of radiographic studies, pulse oximetry, re-evaluation of patient's condition and participation in multidisciplinary rounds.   Critical Care Time devoted to patient care services described in this note is 31 minutes. This time reflects time of care of this signee Charlott Holler . This critical care time does not reflect separately billable procedures or procedure time, teaching time or supervisory time of PA/NP/Med student/Med Resident etc but could involve care discussion time.       Charlott Holler [facility] 12/05/2020 10:32 AM  Pager: see AMION  If no response to pager , please call critical care on call (see AMION) until 7pm After 7:00 pm call Elink      Labs   CBC: Recent Labs  Lab 12/03/20 1302 12/03/20 1753 12/04/20 0516 12/05/20 0543  WBC 13.5*  --  10.1 9.5  NEUTROABS 8.8*  --   --   --   HGB 13.4 13.9 14.5 11.0*  HCT 40.9 41.0 36.3* 34.1*  MCV 86.8  --  87.9 89.0  PLT 298  --  308 220    Basic Metabolic Panel: Recent Labs  Lab 12/03/20 1345 12/03/20 1753 12/03/20 2235 12/04/20 0516 12/04/20 1752 12/05/20 0543  NA 133* 135 132* 130*  --  136  K 2.6* 3.1* 3.9 5.4*  --  3.2*  CL 95*  --  95* 95*  --  99  CO2 26  --  25 22  --  26  GLUCOSE 203*  --  115* 141*  --  159*  BUN 13  --  11 12  --  13  CREATININE 1.10  --  0.79 1.05  --  0.93  CALCIUM 8.9  --  8.5* 8.0*  --  8.2*  MG  --  2.1  --   --  2.7* 2.4  PHOS  --  2.6  --   --  3.9 4.3   GFR: Estimated Creatinine Clearance: 94 mL/min (by C-G formula based on SCr of 0.93 mg/dL). Recent Labs  Lab 12/03/20 1302 12/04/20 0516 12/05/20 0543  WBC 13.5* 10.1 9.5    Liver Function Tests: Recent Labs  Lab 12/03/20 1345 12/04/20 0516  AST 30 RESULTS UNAVAILABLE DUE TO INTERFERING SUBSTANCE  ALT 25 RESULTS UNAVAILABLE DUE TO INTERFERING  SUBSTANCE  ALKPHOS 72 59  BILITOT 0.6 2.6*  PROT 7.3 RESULTS UNAVAILABLE DUE TO INTERFERING SUBSTANCE  ALBUMIN 3.9 3.2*   No results for input(s): LIPASE, AMYLASE in the last 168 hours. No results for input(s): AMMONIA in the last 168 hours.  ABG    Component Value Date/Time   PHART 7.426 12/03/2020 1753   PCO2ART 38.8 12/03/2020 1753   PO2ART 53 (L) 12/03/2020 1753   HCO3 25.7 12/03/2020 1753   TCO2 27 12/03/2020 1753   O2SAT 89.0 12/03/2020 1753     Coagulation Profile: Recent Labs  Lab 12/03/20 1345 12/03/20 1753  INR 1.0 1.0    Cardiac Enzymes: No results for input(s): CKTOTAL, CKMB, CKMBINDEX, TROPONINI in the last 168 hours.  HbA1C: Hgb A1c MFr Bld  Date/Time Value Ref Range Status  12/04/2020 05:16  AM 5.7 (H) 4.8 - 5.6 % Final    Comment:    (NOTE) Pre diabetes:          5.7%-6.4%  Diabetes:              >6.4%  Glycemic control for   <7.0% adults with diabetes     CBG: Recent Labs  Lab 12/03/20 2320 12/04/20 0325 12/04/20 0813 12/04/20 1144 12/04/20 1550  GLUCAP 117* 161* 159* 176* 161*

## 2020-12-05 NOTE — Evaluation (Addendum)
Clinical/Bedside Swallow Evaluation Patient Details  Name: Travis Williams MRN: [id number] Date of Birth: 09/05/1955  Today's Date: 12/05/2020 Time: SLP Start Time (ACUTE ONLY): 1225 SLP Stop Time (ACUTE ONLY): 1235 SLP Time Calculation (min) (ACUTE ONLY): 10 min  Past Medical History: No past medical history on file. Past Surgical History:  HPI:  Travis Williams is a 65 y.o. male who only has PMH known of HTN.  He presented to [facility] 9/15 with R hemiplegia and slurred speech. CT head was negative. 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  He was given TNKase and then required intubation.     He was then transferred to [facility] and was taken to IR and had mechanical thrombectomy with rescue stenting of unstable plaque and mid basilar artery. Of note, IR was unable to pass an OGT due to possible stricture. NGT present, but passed [place]. MD requested formal speech eval.    Assessment / Plan / Recommendation  Clinical Impression  Pt presents with swallow function WNL as determined by bedside. He had no s/s aspiration with any consistency trialed. Intubation was brief (1 day). CXR is unconcerning for aspiration. Oral mech exam was unremarkable. Plan to initiate regular consistency diet and thin liquids. Meds to be taken whole with liquid or as tolerated. Notable concern for possible esophageal stricture as IR attempted to pass OGT at admission. Should further swallow concerns arise, would recommend esophageal testing. No further ST for dysphagia at this time. Please see cognitive linguistic evaluation note for further details.  SLP Visit Diagnosis: Dysphagia, unspecified (R13.10)    Aspiration Risk  Mild aspiration risk    Diet Recommendation Regular;Thin liquid   Liquid Administration via: Cup;Straw Medication Administration: Whole meds with liquid Supervision: Patient able to self feed    Other  Recommendations Oral Care Recommendations: Oral care BID    Recommendations for  follow up therapy are one component of a multi-disciplinary discharge planning process, led by the attending physician.  Recommendations may be updated based on patient status, additional functional criteria and insurance authorization.  Follow up Recommendations None        Swallow Study   General Date of Onset: 12/04/20 HPI: Travis Williams is a 65 y.o. male who only has PMH known of HTN.  He presented to [facility] 9/15 with R hemiplegia and slurred speech. CT head was negative. 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  He was given TNKase and then required intubation.     He was then transferred to [facility] and was taken to IR and had mechanical thrombectomy with rescue stenting of unstable plaque and mid basilar artery. Of note, IR was unable to pass an OGT due to possible stricture. NGT present, but passed [place]. MD requested formal speech eval. Type of Study: Bedside Swallow Evaluation Diet Prior to this Study: NPO;NG Tube Temperature Spikes Noted: No Respiratory Status: Nasal cannula History of Recent Intubation: Yes Length of Intubations (days): 1 days Date extubated: 12/04/20 Behavior/Cognition: Alert;Cooperative;Pleasant mood Oral Cavity Assessment: Within Functional Limits Oral Care Completed by SLP: Recent completion by staff Oral Cavity - Dentition: Adequate natural dentition Vision: Functional for self-feeding Self-Feeding Abilities: Able to feed self Patient Positioning: Upright in chair Baseline Vocal Quality: Normal Volitional Cough: Strong Volitional Swallow: Able to elicit    Oral/Motor/Sensory Function Overall Oral Motor/Sensory Function: Within functional limits   Ice Chips Ice chips: Not tested   Thin Liquid Thin Liquid: Within functional limits Presentation: Cup;Spoon;Straw    Nectar Thick Nectar  Thick Liquid: Not tested   Honey Thick Honey Thick Liquid: Not tested   Puree Puree: Within functional limits   Solid    Travis Williams,  M.S., CCC-SLP Speech-Language Pathologist Acute Rehabilitation Services Pager: [phone]  Solid: Within functional limits Presentation: Self Fed      [name] 12/05/2020,1:11 PM

## 2020-12-05 NOTE — Progress Notes (Signed)
Referring Physician(s): Code stroke 9/15  Supervising Physician: Gilmer Mor  Patient Status:  [facility] - In-pt  Chief Complaint: Follow up mechanical thrombectomy and rescue stent placement within mid-basilar artery 12/03/20  Subjective:  Patient sitting up in chair watching TV, he denies any complaints right now - he is hoping to eat something soon.   Allergies: Patient has no known allergies.  Medications: Prior to Admission medications   Medication Sig Start Date End Date Taking? Authorizing Provider  chlorthalidone (HYGROTON) 25 MG tablet Take 25 mg by mouth daily. 11/16/20  Yes [provider]     Vital Signs: BP (!) 142/75 (BP Location: Right Arm)   Pulse 86   Temp 98.8 F (37.1 C) (Axillary)   Resp 19   Ht 5\' 11"  (1.803 m)   SpO2 98%   BMI 30.69 kg/m   Physical Exam Vitals and nursing note reviewed.  Constitutional:      General: He is not in acute distress. HENT:     Head: Normocephalic.  Cardiovascular:     Rate and Rhythm: Normal rate.     Comments: (+) right CFA puncture soft, non tender, non pulsatile, no erythema/edema/hematoma Pulmonary:     Effort: Pulmonary effort is normal.  Skin:    General: Skin is warm and dry.  Neurological:     Mental Status: He is alert.  Alert, awake, and oriented x 3 Speech and comprehension in tact EOMs without nystagmus or subjective diplopia. Visual fields grossly in tact No facial asymmetry. Tongue midline Motor power full in all 4 extremities Fine motor and coordination grossly in tact Gait not assessed   Imaging: CT HEAD WO CONTRAST  Result Date: 12/03/2020 CLINICAL DATA:  Stroke EXAM: CT HEAD WITHOUT CONTRAST TECHNIQUE: Contiguous axial images were obtained from the base of the skull through the vertex without intravenous contrast. COMPARISON:  12/03/2020 FINDINGS: Brain: Interval stent placement within the basilar artery. Interval development of hypodensities within the bilateral cerebellar  hemispheres, right greater than left, which could reflect evolving infarcts. Chronic small vessel ischemic changes are identified within the periventricular white matter, stable. No signs of acute hemorrhage. Lateral ventricles and remaining midline structures are unremarkable. No acute extra-axial fluid collections. No mass effect. Vascular: Stable atherosclerosis of the internal carotid arteries. New stent within the basilar artery. No hyperdense vessel. Skull: Normal. Negative for fracture or focal lesion. Sinuses/Orbits: Diffuse mucosal thickening throughout the ethmoid, sphenoid, and maxillary sinuses may be due to intubation. Other: None. IMPRESSION: 1. Subtle hypodensities within the bilateral cerebellar hemispheres which could reflect areas of evolving infarction. 2. No evidence of intracranial hemorrhage. 3. Interval placement of a basilar artery stent. Electronically Signed   By: Sharlet Salina M.D.   On: 12/03/2020 22:00   MR BRAIN WO CONTRAST  Result Date: 12/04/2020 CLINICAL DATA:  Stroke, follow-up; post TNK and thrombectomy EXAM: MRI HEAD WITHOUT CONTRAST TECHNIQUE: Multiplanar, multiecho pulse sequences of the brain and surrounding structures were obtained without intravenous contrast. COMPARISON:  Correlation made with recent CT imaging. FINDINGS: Brain: Foci of mildly reduced diffusion are present within the cerebellum and occipital lobes bilaterally as well as the left parasagittal pons and left hippocampus. There is minimal susceptibility hypointensity some of these areas. Additional patchy and confluent areas of T2 hyperintensity in the supratentorial white matter are nonspecific but may reflect mild to moderate chronic microvascular ischemic changes. There is no intracranial mass or mass effect. There is no hydrocephalus or extra-axial fluid collection. Vascular: Major vessel flow voids at  the skull base are preserved. Skull and upper cervical spine: Normal marrow signal is preserved.  Sinuses/Orbits: Paranasal sinuses are aerated. Orbits are unremarkable. Other: Sella is unremarkable.  Mastoid air cells are clear. IMPRESSION: Small acute infarcts involving bilateral cerebellum, occipital lobes, left parasagittal pons, and left hippocampus. Minimal petechial hemorrhage. Heidelberg classification 1a: HI1, scattered small petechiae, no mass effect. Chronic microvascular ischemic changes. Electronically Signed   By: Guadlupe Spanish M.D.   On: 12/04/2020 17:05   IR Intra Cran Stent  Result Date: 12/04/2020 INDICATION: 65 year old male presents with acute basilar occlusion for mechanical thrombectomy EXAM: ULTRASOUND-GUIDED ACCESS RIGHT COMMON FEMORAL ARTERY CERVICAL AND CEREBRAL ANGIOGRAM MECHANICAL THROMBECTOMY BASILAR ARTERY RESCUE STENT UNSTABLE PLAQUE BASILAR ARTERY ANGIO-SEAL DEPLOYED FOR HEMOSTASIS COMPARISON:  CT imaging of the same day MEDICATIONS: Intravenous kangrelor bolus and initiation of drip ANESTHESIA/SEDATION: The anesthesia team was present to provide general endotracheal tube anesthesia and for patient monitoring during the procedure. Intubation was performed before arrival to the neuro angio suite. Left radial arterial line was performed by the anesthesia team. Interventional neuro radiology nursing staff was also present. CONTRAST:  200 cc FLUOROSCOPY TIME:  Fluoroscopy Time: 23 minutes 0 seconds (2,527 mGy). COMPLICATIONS: None TECHNIQUE: Emergent consent was implied, with assumed procedural risks, benefits and alternatives. Specific risks include: Bleeding, infection, contrast reaction, kidney injury/failure, need for further procedure/surgery, arterial injury or dissection, embolization to new territory, intracranial hemorrhage (10-15% risk), neurologic deterioration, cardiopulmonary collapse, death. Maximal Sterile Barrier Technique was utilized including during the procedure including caps, mask, sterile gowns, sterile gloves, sterile drape, hand hygiene and skin  antiseptic. A timeout was performed prior to the initiation of the procedure. The anesthesia team was present to provide general endotracheal tube anesthesia and for patient monitoring during the procedure. Interventional neuro radiology nursing staff was also present. FINDINGS: Initial Findings: Left vertebral artery: Normal course caliber and contour. Basilar artery: Short segment critical stenosis within the mid basilar artery, corresponding to findings on the preoperative CT angiogram. Contrast penetrates beyond the occlusion, but with minimal filling of the territory beyond the P1 arteries and superior cerebellar arteries. There is a fairly rounded luminal filling defect at the region of stenosis, with the most likely diagnosis being unstable ruptured atherosclerotic plaque. Initial TICI flow: TICI 1. Mild irregularity in the proximal basilar artery and the right vertebral artery compatible with intracranial atherosclerotic changes. Completion Findings: After the initial aspiration thrombectomy there is complete restoration of flow through the segment, with adequate distal perfusion, compatible with TICI 3: Complete perfusion of the territory. After observation for 5 minutes-10 minutes there is reocclusion at the unstable plaque site. A second aspiration thrombectomy reopened this segment, and then rescue stenting was performed with balloon mounted 2.5 mm x 8 mm on X resolution stent, as described in the procedural note. After the stent was deployed there was again restoration of complete flow through the segment, achieving TICI 3: Complete perfusion of the territory Completion TICI: TICI 3 Flat panel CT: No acute intracranial hemorrhage. No midline shift or mass effect after reperfusion. No contrast staining of the affected territory observed. PROCEDURE: The anesthesia team was present to provide general endotracheal tube anesthesia and for patient monitoring during the procedure. Intubation was performed in  negative pressure [place] in neuro IR holding. Interventional neuro radiology nursing staff was also present. Ultrasound survey of the right inguinal region was performed with images stored and sent to PACs. 11 blade scalpel was used to make a small incision. Blunt dissection was performed with US  guidance. A micropuncture needle was used access the right common femoral artery under ultrasound. With excellent arterial blood flow returned, an .018 micro wire was passed through the needle, observed to enter the abdominal aorta under fluoroscopy. The needle was removed, and a micropuncture sheath was placed over the wire. The inner dilator and wire were removed, and an 035 wire was advanced under fluoroscopy into the abdominal aorta. The sheath was removed and a 25cm 96F straight vascular sheath was placed. The dilator was removed and the sheath was flushed. Sheath was attached to pressurized and heparinized saline bag for constant forward flow. A coaxial system was then advanced over the 035 wire. This included a 80cm Neuron Max with coaxial 100cm JB1 diagnostic catheter. This was advanced to the proximal descending thoracic aorta. Wire was then removed. Double flush of the catheter was performed. Catheter was then used to select the left subclavian artery. Angiogram was performed. Using roadmap technique, the catheter was advanced over a standard glide wire into left vertebral, with distal position achieved of the diagnostic catheter. The Neuron max catheter was then advanced to the distal vertebral artery, and the diagnostic catheter and the glide wire were removed. Double flush was performed to clear the guide. Formal angiogram was performed. Road map function was used once the occluded vessel was identified. Copious back flush was performed and the balloon catheter was attached to heparinized and pressurized saline bag for forward flow. A second coaxial system was then advanced through the balloon catheter, which  included the selected intermediate catheter, microcatheter, and microwire. In this scenario, the set up included a 137cm Zoom 55 intermediate catheter, a 160cm 021 lumen microcatheter, and 014 synchro soft wire. This system was advanced through the NeuronMax catheter under the road-map function, with adequate back-flush at the rotating hemostatic valve at that back end of the balloon guide. Microcatheter and the intermediate catheter system were advanced to the level of the occlusion. Microcatheter and microwire were then removed, as well as the rotating hemostatic valve on the zoom catheter. The suction engine was then attached to the hub of the Zoom catheter and direct aspiration at the face of the clot was performed. We turned on the vacuum engine, confirming flow, then gently advanced the aspiration catheter into the occlusion. Stasis of flow was observed. We then observed a 3 minute interval. The cather was then pushed through the occlusion to the distal basilar artery, and then the Zoom catheter was withdrawn into the Neuron Max. Aspiration was returning adequate flow at this time. The engine was removed from the hub of the zoom catheter, double flush was performed confirming adequate return of flow, and angiogram was performed. Angiogram demonstrated restoration of flow through the segment, with 50% narrowing in the mid segment of the basilar artery, at the site of the previous occlusion. There was irregular filling defect at this site, most likely representing the site of unstable plaque rupture/intracranial atherosclerotic disease. At this time we elected to observe the lesion, for potential rethrombosis and need for rescue stenting. During this observation time we attempted placement of orogastric tube. Anesthesia tried on 3 attempts to pass orogastric tube to the stomach, only able to pass the gastric tube to the distal esophagus. We were unable to confirm placement below the diaphragm, and were thus  unable to use the gastric tube. This took approximately 10 minutes of working time. Angiogram at the end of this working time then demonstrated reocclusion at the mid basilar artery. Zoom catheter was  advanced gently with hand aspiration through the site of occlusion, and then withdrawn, which restored flow. We then prepared for rescue stenting. Parenteral antiplatelets were initiated with a bolus and drip of Kangrelor. Measurements were made of the mid basilar, and we elected to proceed with balloon mounted stenting with 2.5 mm x 8 mm resolute onyx coronary stent. The zoom catheter was removed. Trevo microcatheter and the synchro soft were navigated into the left PCA. Microcatheter was then used to deliver an exchange length transcend floppy tip wire. Once the wire was in position the catheter was removed. We then proceeded with balloon mounted stenting in the mid basilar segment. Ten atmosphere of inflation was observed, corresponding to 2.45 mm diameter inflation. Delivery device was removed and repeat angiogram was performed. All catheters and wires were removed. The skin at the puncture site was then cleaned with Chlorhexidine. The 8 French sheath was removed and an 70F angioseal was deployed. Flat panel CT was performed. Patient remained intubated given the pretreatment clinical status and basilar occlusion. Patient tolerated the procedure well and remained hemodynamically stable throughout. No complications were encountered and no significant blood loss encountered. 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 Angio-Seal for hemostasis Signed, Yvone Neu. Reyne Dumas, RPVI [facility] Radiology PLAN: The patient will remain intubated, given  clinical status before treatment ICU status Target systolic blood pressure of 120-140 Right hip straight time 6 hours Frequent neurovascular checks Repeat neurologic imaging with CT and/MRI at the discretion of neurology team The IV kangrelor will be terminated after a total time of 2 hours per protocol, 6:06 p.m. The patient will receive rectal 600 mg aspirin upon arrival in the ICU Daily aspirin suppositories until a more durable method of p.o. antiplatelets is available, versus patient ability to swallow Electronically Signed   By: Gilmer Mor D.O.   On: 12/04/2020 09:22   IR CT Head Ltd  Result Date: 12/04/2020 INDICATION: 65 year old male presents with acute basilar occlusion for mechanical thrombectomy EXAM: ULTRASOUND-GUIDED ACCESS RIGHT COMMON FEMORAL ARTERY CERVICAL AND CEREBRAL ANGIOGRAM MECHANICAL THROMBECTOMY BASILAR ARTERY RESCUE STENT UNSTABLE PLAQUE BASILAR ARTERY ANGIO-SEAL DEPLOYED FOR HEMOSTASIS COMPARISON:  CT imaging of the same day MEDICATIONS: Intravenous kangrelor bolus and initiation of drip ANESTHESIA/SEDATION: The anesthesia team was present to provide general endotracheal tube anesthesia and for patient monitoring during the procedure. Intubation was performed before arrival to the neuro angio suite. Left radial arterial line was performed by the anesthesia team. Interventional neuro radiology nursing staff was also present. CONTRAST:  200 cc FLUOROSCOPY TIME:  Fluoroscopy Time: 23 minutes 0 seconds (2,527 mGy). COMPLICATIONS: None TECHNIQUE: Emergent consent was implied, with assumed procedural risks, benefits and alternatives. Specific risks include: Bleeding, infection, contrast reaction, kidney injury/failure, need for further procedure/surgery, arterial injury or dissection, embolization to new territory, intracranial hemorrhage (10-15% risk), neurologic deterioration, cardiopulmonary collapse, death. Maximal Sterile Barrier Technique was utilized including during the procedure  including caps, mask, sterile gowns, sterile gloves, sterile drape, hand hygiene and skin antiseptic. A timeout was performed prior to the initiation of the procedure. The anesthesia team was present to provide general endotracheal tube anesthesia and for patient monitoring during the procedure. Interventional neuro radiology nursing staff was also present. FINDINGS: Initial Findings: Left vertebral artery: Normal course caliber and contour. Basilar artery: Short segment critical stenosis  within the mid basilar artery, corresponding to findings on the preoperative CT angiogram. Contrast penetrates beyond the occlusion, but with minimal filling of the territory beyond the P1 arteries and superior cerebellar arteries. There is a fairly rounded luminal filling defect at the region of stenosis, with the most likely diagnosis being unstable ruptured atherosclerotic plaque. Initial TICI flow: TICI 1. Mild irregularity in the proximal basilar artery and the right vertebral artery compatible with intracranial atherosclerotic changes. Completion Findings: After the initial aspiration thrombectomy there is complete restoration of flow through the segment, with adequate distal perfusion, compatible with TICI 3: Complete perfusion of the territory. After observation for 5 minutes-10 minutes there is reocclusion at the unstable plaque site. A second aspiration thrombectomy reopened this segment, and then rescue stenting was performed with balloon mounted 2.5 mm x 8 mm on X resolution stent, as described in the procedural note. After the stent was deployed there was again restoration of complete flow through the segment, achieving TICI 3: Complete perfusion of the territory Completion TICI: TICI 3 Flat panel CT: No acute intracranial hemorrhage. No midline shift or mass effect after reperfusion. No contrast staining of the affected territory observed. PROCEDURE: The anesthesia team was present to provide general endotracheal tube  anesthesia and for patient monitoring during the procedure. Intubation was performed in negative pressure [place] in neuro IR holding. Interventional neuro radiology nursing staff was also present. Ultrasound survey of the right inguinal region was performed with images stored and sent to PACs. 11 blade scalpel was used to make a small incision. Blunt dissection was performed with US guidance. A micropuncture needle was used access the right common femoral artery under ultrasound. With excellent arterial blood flow returned, an .018 micro wire was passed through the needle, observed to enter the abdominal aorta under fluoroscopy. The needle was removed, and a micropuncture sheath was placed over the wire. The inner dilator and wire were removed, and an 035 wire was advanced under fluoroscopy into the abdominal aorta. The sheath was removed and a 25cm 73F straight vascular sheath was placed. The dilator was removed and the sheath was flushed. Sheath was attached to pressurized and heparinized saline bag for constant forward flow. A coaxial system was then advanced over the 035 wire. This included a 80cm Neuron Max with coaxial 100cm JB1 diagnostic catheter. This was advanced to the proximal descending thoracic aorta. Wire was then removed. Double flush of the catheter was performed. Catheter was then used to select the left subclavian artery. Angiogram was performed. Using roadmap technique, the catheter was advanced over a standard glide wire into left vertebral, with distal position achieved of the diagnostic catheter. The Neuron max catheter was then advanced to the distal vertebral artery, and the diagnostic catheter and the glide wire were removed. Double flush was performed to clear the guide. Formal angiogram was performed. Road map function was used once the occluded vessel was identified. Copious back flush was performed and the balloon catheter was attached to heparinized and pressurized saline bag for forward  flow. A second coaxial system was then advanced through the balloon catheter, which included the selected intermediate catheter, microcatheter, and microwire. In this scenario, the set up included a 137cm Zoom 55 intermediate catheter, a 160cm 021 lumen microcatheter, and 014 synchro soft wire. This system was advanced through the NeuronMax catheter under the road-map function, with adequate back-flush at the rotating hemostatic valve at that back end of the balloon guide. Microcatheter and the intermediate catheter system were advanced  to the level of the occlusion. Microcatheter and microwire were then removed, as well as the rotating hemostatic valve on the zoom catheter. The suction engine was then attached to the hub of the Zoom catheter and direct aspiration at the face of the clot was performed. We turned on the vacuum engine, confirming flow, then gently advanced the aspiration catheter into the occlusion. Stasis of flow was observed. We then observed a 3 minute interval. The cather was then pushed through the occlusion to the distal basilar artery, and then the Zoom catheter was withdrawn into the Neuron Max. Aspiration was returning adequate flow at this time. The engine was removed from the hub of the zoom catheter, double flush was performed confirming adequate return of flow, and angiogram was performed. Angiogram demonstrated restoration of flow through the segment, with 50% narrowing in the mid segment of the basilar artery, at the site of the previous occlusion. There was irregular filling defect at this site, most likely representing the site of unstable plaque rupture/intracranial atherosclerotic disease. At this time we elected to observe the lesion, for potential rethrombosis and need for rescue stenting. During this observation time we attempted placement of orogastric tube. Anesthesia tried on 3 attempts to pass orogastric tube to the stomach, only able to pass the gastric tube to the distal  esophagus. We were unable to confirm placement below the diaphragm, and were thus unable to use the gastric tube. This took approximately 10 minutes of working time. Angiogram at the end of this working time then demonstrated reocclusion at the mid basilar artery. Zoom catheter was advanced gently with hand aspiration through the site of occlusion, and then withdrawn, which restored flow. We then prepared for rescue stenting. Parenteral antiplatelets were initiated with a bolus and drip of Kangrelor. Measurements were made of the mid basilar, and we elected to proceed with balloon mounted stenting with 2.5 mm x 8 mm resolute onyx coronary stent. The zoom catheter was removed. Trevo microcatheter and the synchro soft were navigated into the left PCA. Microcatheter was then used to deliver an exchange length transcend floppy tip wire. Once the wire was in position the catheter was removed. We then proceeded with balloon mounted stenting in the mid basilar segment. Ten atmosphere of inflation was observed, corresponding to 2.45 mm diameter inflation. Delivery device was removed and repeat angiogram was performed. All catheters and wires were removed. The skin at the puncture site was then cleaned with Chlorhexidine. The 8 French sheath was removed and an 50F angioseal was deployed. Flat panel CT was performed. Patient remained intubated given the pretreatment clinical status and basilar occlusion. Patient tolerated the procedure well and remained hemodynamically stable throughout. No complications were encountered and no significant blood loss encountered. 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 Angio-Seal for hemostasis Signed, Yvone Neu. Reyne Dumas, RPVI [facility]iology  Special[facility] Radiology PLAN: The patient will remain intubated, given clinical status before treatment ICU status Target systolic blood pressure of 120-140 Right hip straight time 6 hours Frequent neurovascular checks Repeat neurologic imaging with CT and/MRI at the discretion of neurology team The IV kangrelor will be terminated after a total time of 2 hours per protocol, 6:06 p.m. The patient will receive rectal 600 mg aspirin upon arrival in the ICU Daily aspirin suppositories until a more  durable method of p.o. antiplatelets is available, versus patient ability to swallow Electronically Signed   By: Gilmer Mor D.O.   On: 12/04/2020 09:22   IR US Guide Vasc Access Right  Result Date: 12/04/2020 INDICATION: 65 year old male presents with acute basilar occlusion for mechanical thrombectomy EXAM: ULTRASOUND-GUIDED ACCESS RIGHT COMMON FEMORAL ARTERY CERVICAL AND CEREBRAL ANGIOGRAM MECHANICAL THROMBECTOMY BASILAR ARTERY RESCUE STENT UNSTABLE PLAQUE BASILAR ARTERY ANGIO-SEAL DEPLOYED FOR HEMOSTASIS COMPARISON:  CT imaging of the same day MEDICATIONS: Intravenous kangrelor bolus and initiation of drip ANESTHESIA/SEDATION: The anesthesia team was present to provide general endotracheal tube anesthesia and for patient monitoring during the procedure. Intubation was performed before arrival to the neuro angio suite. Left radial arterial line was performed by the anesthesia team. Interventional neuro radiology nursing staff was also present. CONTRAST:  200 cc FLUOROSCOPY TIME:  Fluoroscopy Time: 23 minutes 0 seconds (2,527 mGy). COMPLICATIONS: None TECHNIQUE: Emergent consent was implied, with assumed procedural risks, benefits and alternatives. Specific risks include: Bleeding, infection, contrast reaction, kidney injury/failure, need for further procedure/surgery, arterial injury or dissection, embolization to new territory, intracranial hemorrhage (10-15% risk), neurologic deterioration, cardiopulmonary  collapse, death. Maximal Sterile Barrier Technique was utilized including during the procedure including caps, mask, sterile gowns, sterile gloves, sterile drape, hand hygiene and skin antiseptic. A timeout was performed prior to the initiation of the procedure. The anesthesia team was present to provide general endotracheal tube anesthesia and for patient monitoring during the procedure. Interventional neuro radiology nursing staff was also present. FINDINGS: Initial Findings: Left vertebral artery: Normal course caliber and contour. Basilar artery: Short segment critical stenosis within the mid basilar artery, corresponding to findings on the preoperative CT angiogram. Contrast penetrates beyond the occlusion, but with minimal filling of the territory beyond the P1 arteries and superior cerebellar arteries. There is a fairly rounded luminal filling defect at the region of stenosis, with the most likely diagnosis being unstable ruptured atherosclerotic plaque. Initial TICI flow: TICI 1. Mild irregularity in the proximal basilar artery and the right vertebral artery compatible with intracranial atherosclerotic changes. Completion Findings: After the initial aspiration thrombectomy there is complete restoration of flow through the segment, with adequate distal perfusion, compatible with TICI 3: Complete perfusion of the territory. After observation for 5 minutes-10 minutes there is reocclusion at the unstable plaque site. A second aspiration thrombectomy reopened this segment, and then rescue stenting was performed with balloon mounted 2.5 mm x 8 mm on X resolution stent, as described in the procedural note. After the stent was deployed there was again restoration of complete flow through the segment, achieving TICI 3: Complete perfusion of the territory Completion TICI: TICI 3 Flat panel CT: No acute intracranial hemorrhage. No midline shift or mass effect after reperfusion. No contrast staining of the affected  territory observed. PROCEDURE: The anesthesia team was present to provide general endotracheal tube anesthesia and for patient monitoring during the procedure. Intubation was performed in negative pressure [place] in neuro IR holding. Interventional neuro radiology nursing staff was also present. Ultrasound survey of the right inguinal region was performed with images stored and sent to PACs. 11 blade scalpel was used to make a small incision. Blunt dissection was performed with US guidance. A micropuncture needle was used access the right common femoral artery under ultrasound. With excellent arterial blood flow returned, an .018 micro wire was passed through the needle, observed to enter the abdominal aorta under fluoroscopy. The needle was removed, and a micropuncture sheath was placed over the wire. The inner  dilator and wire were removed, and an 035 wire was advanced under fluoroscopy into the abdominal aorta. The sheath was removed and a 25cm 3F straight vascular sheath was placed. The dilator was removed and the sheath was flushed. Sheath was attached to pressurized and heparinized saline bag for constant forward flow. A coaxial system was then advanced over the 035 wire. This included a 80cm Neuron Max with coaxial 100cm JB1 diagnostic catheter. This was advanced to the proximal descending thoracic aorta. Wire was then removed. Double flush of the catheter was performed. Catheter was then used to select the left subclavian artery. Angiogram was performed. Using roadmap technique, the catheter was advanced over a standard glide wire into left vertebral, with distal position achieved of the diagnostic catheter. The Neuron max catheter was then advanced to the distal vertebral artery, and the diagnostic catheter and the glide wire were removed. Double flush was performed to clear the guide. Formal angiogram was performed. Road map function was used once the occluded vessel was identified. Copious back flush was  performed and the balloon catheter was attached to heparinized and pressurized saline bag for forward flow. A second coaxial system was then advanced through the balloon catheter, which included the selected intermediate catheter, microcatheter, and microwire. In this scenario, the set up included a 137cm Zoom 55 intermediate catheter, a 160cm 021 lumen microcatheter, and 014 synchro soft wire. This system was advanced through the NeuronMax catheter under the road-map function, with adequate back-flush at the rotating hemostatic valve at that back end of the balloon guide. Microcatheter and the intermediate catheter system were advanced to the level of the occlusion. Microcatheter and microwire were then removed, as well as the rotating hemostatic valve on the zoom catheter. The suction engine was then attached to the hub of the Zoom catheter and direct aspiration at the face of the clot was performed. We turned on the vacuum engine, confirming flow, then gently advanced the aspiration catheter into the occlusion. Stasis of flow was observed. We then observed a 3 minute interval. The cather was then pushed through the occlusion to the distal basilar artery, and then the Zoom catheter was withdrawn into the Neuron Max. Aspiration was returning adequate flow at this time. The engine was removed from the hub of the zoom catheter, double flush was performed confirming adequate return of flow, and angiogram was performed. Angiogram demonstrated restoration of flow through the segment, with 50% narrowing in the mid segment of the basilar artery, at the site of the previous occlusion. There was irregular filling defect at this site, most likely representing the site of unstable plaque rupture/intracranial atherosclerotic disease. At this time we elected to observe the lesion, for potential rethrombosis and need for rescue stenting. During this observation time we attempted placement of orogastric tube. Anesthesia tried on  3 attempts to pass orogastric tube to the stomach, only able to pass the gastric tube to the distal esophagus. We were unable to confirm placement below the diaphragm, and were thus unable to use the gastric tube. This took approximately 10 minutes of working time. Angiogram at the end of this working time then demonstrated reocclusion at the mid basilar artery. Zoom catheter was advanced gently with hand aspiration through the site of occlusion, and then withdrawn, which restored flow. We then prepared for rescue stenting. Parenteral antiplatelets were initiated with a bolus and drip of Kangrelor. Measurements were made of the mid basilar, and we elected to proceed with balloon mounted stenting with 2.5 mm  x 8 mm resolute onyx coronary stent. The zoom catheter was removed. Trevo microcatheter and the synchro soft were navigated into the left PCA. Microcatheter was then used to deliver an exchange length transcend floppy tip wire. Once the wire was in position the catheter was removed. We then proceeded with balloon mounted stenting in the mid basilar segment. Ten atmosphere of inflation was observed, corresponding to 2.45 mm diameter inflation. Delivery device was removed and repeat angiogram was performed. All catheters and wires were removed. The skin at the puncture site was then cleaned with Chlorhexidine. The 8 French sheath was removed and an 23F angioseal was deployed. Flat panel CT was performed. Patient remained intubated given the pretreatment clinical status and basilar occlusion. Patient tolerated the procedure well and remained hemodynamically stable throughout. No complications were encountered and no significant blood loss encountered. 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  Angio-Seal for hemostasis Signed, Yvone Neu. Reyne Dumas, RPVI [facility] Radiology PLAN: The patient will remain intubated, given clinical status before treatment ICU status Target systolic blood pressure of 120-140 Right hip straight time 6 hours Frequent neurovascular checks Repeat neurologic imaging with CT and/MRI at the discretion of neurology team The IV kangrelor will be terminated after a total time of 2 hours per protocol, 6:06 p.m. The patient will receive rectal 600 mg aspirin upon arrival in the ICU Daily aspirin suppositories until a more durable method of p.o. antiplatelets is available, versus patient ability to swallow Electronically Signed   By: Gilmer Mor D.O.   On: 12/04/2020 09:22   DG Chest Port 1 View  Result Date: 12/03/2020 CLINICAL DATA:  Acute respiratory failure. Endotracheally intubated. EXAM: PORTABLE CHEST 1 VIEW COMPARISON:  None. FINDINGS: Endotracheal tube is seen in place, with distal tip approximately 4.5 cm above the carina. Low lung volumes are noted. Right upper lobe collapse is seen as well as atelectasis or infiltrate in the left lower lobe. No evidence of pneumothorax. IMPRESSION: Endotracheal tube in appropriate position. Right upper lobe collapse, and left lower lobe atelectasis versus infiltrate. Electronically Signed   By: Danae Orleans M.D.   On: 12/03/2020 18:37   DG Abd Portable 1V  Result Date: 12/04/2020 CLINICAL DATA:  Feeding tube placement EXAM: PORTABLE ABDOMEN - 1 VIEW COMPARISON:  None. FINDINGS: Feeding tube tip in the mid to distal stomach. Nonobstructive bowel gas pattern. IMPRESSION: Feeding tube tip in the mid to distal stomach. Electronically Signed   By: Charlett Nose M.D.   On: 12/04/2020 10:58   IR PERCUTANEOUS ART THROMBECTOMY/INFUSION INTRACRANIAL INC DIAG ANGIO  Result Date: 12/04/2020 INDICATION: 65 year old male presents with acute basilar occlusion for mechanical thrombectomy EXAM:  ULTRASOUND-GUIDED ACCESS RIGHT COMMON FEMORAL ARTERY CERVICAL AND CEREBRAL ANGIOGRAM MECHANICAL THROMBECTOMY BASILAR ARTERY RESCUE STENT UNSTABLE PLAQUE BASILAR ARTERY ANGIO-SEAL DEPLOYED FOR HEMOSTASIS COMPARISON:  CT imaging of the same day MEDICATIONS: Intravenous kangrelor bolus and initiation of drip ANESTHESIA/SEDATION: The anesthesia team was present to provide general endotracheal tube anesthesia and for patient monitoring during the procedure. Intubation was performed before arrival to the neuro angio suite. Left radial arterial line was performed by the anesthesia team. Interventional neuro radiology nursing staff was also present. CONTRAST:  200 cc FLUOROSCOPY TIME:  Fluoroscopy Time: 23 minutes 0 seconds (2,527 mGy). COMPLICATIONS: None TECHNIQUE: Emergent consent was implied,  with assumed procedural risks, benefits and alternatives. Specific risks include: Bleeding, infection, contrast reaction, kidney injury/failure, need for further procedure/surgery, arterial injury or dissection, embolization to new territory, intracranial hemorrhage (10-15% risk), neurologic deterioration, cardiopulmonary collapse, death. Maximal Sterile Barrier Technique was utilized including during the procedure including caps, mask, sterile gowns, sterile gloves, sterile drape, hand hygiene and skin antiseptic. A timeout was performed prior to the initiation of the procedure. The anesthesia team was present to provide general endotracheal tube anesthesia and for patient monitoring during the procedure. Interventional neuro radiology nursing staff was also present. FINDINGS: Initial Findings: Left vertebral artery: Normal course caliber and contour. Basilar artery: Short segment critical stenosis within the mid basilar artery, corresponding to findings on the preoperative CT angiogram. Contrast penetrates beyond the occlusion, but with minimal filling of the territory beyond the P1 arteries and superior cerebellar arteries.  There is a fairly rounded luminal filling defect at the region of stenosis, with the most likely diagnosis being unstable ruptured atherosclerotic plaque. Initial TICI flow: TICI 1. Mild irregularity in the proximal basilar artery and the right vertebral artery compatible with intracranial atherosclerotic changes. Completion Findings: After the initial aspiration thrombectomy there is complete restoration of flow through the segment, with adequate distal perfusion, compatible with TICI 3: Complete perfusion of the territory. After observation for 5 minutes-10 minutes there is reocclusion at the unstable plaque site. A second aspiration thrombectomy reopened this segment, and then rescue stenting was performed with balloon mounted 2.5 mm x 8 mm on X resolution stent, as described in the procedural note. After the stent was deployed there was again restoration of complete flow through the segment, achieving TICI 3: Complete perfusion of the territory Completion TICI: TICI 3 Flat panel CT: No acute intracranial hemorrhage. No midline shift or mass effect after reperfusion. No contrast staining of the affected territory observed. PROCEDURE: The anesthesia team was present to provide general endotracheal tube anesthesia and for patient monitoring during the procedure. Intubation was performed in negative pressure [place] in neuro IR holding. Interventional neuro radiology nursing staff was also present. Ultrasound survey of the right inguinal region was performed with images stored and sent to PACs. 11 blade scalpel was used to make a small incision. Blunt dissection was performed with US guidance. A micropuncture needle was used access the right common femoral artery under ultrasound. With excellent arterial blood flow returned, an .018 micro wire was passed through the needle, observed to enter the abdominal aorta under fluoroscopy. The needle was removed, and a micropuncture sheath was placed over the wire. The inner  dilator and wire were removed, and an 035 wire was advanced under fluoroscopy into the abdominal aorta. The sheath was removed and a 25cm 69F straight vascular sheath was placed. The dilator was removed and the sheath was flushed. Sheath was attached to pressurized and heparinized saline bag for constant forward flow. A coaxial system was then advanced over the 035 wire. This included a 80cm Neuron Max with coaxial 100cm JB1 diagnostic catheter. This was advanced to the proximal descending thoracic aorta. Wire was then removed. Double flush of the catheter was performed. Catheter was then used to select the left subclavian artery. Angiogram was performed. Using roadmap technique, the catheter was advanced over a standard glide wire into left vertebral, with distal position achieved of the diagnostic catheter. The Neuron max catheter was then advanced to the distal vertebral artery, and the diagnostic catheter and the glide wire were removed. Double flush was performed to clear the  guide. Formal angiogram was performed. Road map function was used once the occluded vessel was identified. Copious back flush was performed and the balloon catheter was attached to heparinized and pressurized saline bag for forward flow. A second coaxial system was then advanced through the balloon catheter, which included the selected intermediate catheter, microcatheter, and microwire. In this scenario, the set up included a 137cm Zoom 55 intermediate catheter, a 160cm 021 lumen microcatheter, and 014 synchro soft wire. This system was advanced through the NeuronMax catheter under the road-map function, with adequate back-flush at the rotating hemostatic valve at that back end of the balloon guide. Microcatheter and the intermediate catheter system were advanced to the level of the occlusion. Microcatheter and microwire were then removed, as well as the rotating hemostatic valve on the zoom catheter. The suction engine was then attached  to the hub of the Zoom catheter and direct aspiration at the face of the clot was performed. We turned on the vacuum engine, confirming flow, then gently advanced the aspiration catheter into the occlusion. Stasis of flow was observed. We then observed a 3 minute interval. The cather was then pushed through the occlusion to the distal basilar artery, and then the Zoom catheter was withdrawn into the Neuron Max. Aspiration was returning adequate flow at this time. The engine was removed from the hub of the zoom catheter, double flush was performed confirming adequate return of flow, and angiogram was performed. Angiogram demonstrated restoration of flow through the segment, with 50% narrowing in the mid segment of the basilar artery, at the site of the previous occlusion. There was irregular filling defect at this site, most likely representing the site of unstable plaque rupture/intracranial atherosclerotic disease. At this time we elected to observe the lesion, for potential rethrombosis and need for rescue stenting. During this observation time we attempted placement of orogastric tube. Anesthesia tried on 3 attempts to pass orogastric tube to the stomach, only able to pass the gastric tube to the distal esophagus. We were unable to confirm placement below the diaphragm, and were thus unable to use the gastric tube. This took approximately 10 minutes of working time. Angiogram at the end of this working time then demonstrated reocclusion at the mid basilar artery. Zoom catheter was advanced gently with hand aspiration through the site of occlusion, and then withdrawn, which restored flow. We then prepared for rescue stenting. Parenteral antiplatelets were initiated with a bolus and drip of Kangrelor. Measurements were made of the mid basilar, and we elected to proceed with balloon mounted stenting with 2.5 mm x 8 mm resolute onyx coronary stent. The zoom catheter was removed. Trevo microcatheter and the synchro  soft were navigated into the left PCA. Microcatheter was then used to deliver an exchange length transcend floppy tip wire. Once the wire was in position the catheter was removed. We then proceeded with balloon mounted stenting in the mid basilar segment. Ten atmosphere of inflation was observed, corresponding to 2.45 mm diameter inflation. Delivery device was removed and repeat angiogram was performed. All catheters and wires were removed. The skin at the puncture site was then cleaned with Chlorhexidine. The 8 French sheath was removed and an 30F angioseal was deployed. Flat panel CT was performed. Patient remained intubated given the pretreatment clinical status and basilar occlusion. Patient tolerated the procedure well and remained hemodynamically stable throughout. No complications were encountered and no significant blood loss encountered. IMPRESSION: Status post ultrasound guided access right common femoral artery, mechanical thrombectomy of mid  basilar occlusion at the site of unstable ruptured plaque/ICAD, rescue stenting of mid basilar artery unstable plaque after rethrombosis, achieving TICI 3 flow. Angio-Seal for hemostasis Signed, Yvone Neu. Reyne Dumas, RPVI [facility] Radiology PLAN: The patient will remain intubated, given clinical status before treatment ICU status Target systolic blood pressure of 120-140 Right hip straight time 6 hours Frequent neurovascular checks Repeat neurologic imaging with CT and/MRI at the discretion of neurology team The IV kangrelor will be terminated after a total time of 2 hours per protocol, 6:06 p.m. The patient will receive rectal 600 mg aspirin upon arrival in the ICU Daily aspirin suppositories until a more durable method of p.o. antiplatelets is available, versus patient ability to swallow Electronically Signed   By: Gilmer Mor D.O.   On: 12/04/2020 09:22   CT HEAD CODE STROKE WO CONTRAST  Addendum Date:  12/03/2020   ADDENDUM REPORT: 12/03/2020 14:11 ADDENDUM: Suspected small age-indeterminate infarct within the right cerebellar hemisphere (series 6, image 18) (series 5, images 50 and 51). These results were called by telephone at the time of interpretation on 12/03/2020 at 1:42 PM to provider Dr. Selina Cooley, who verbally acknowledged these results. Electronically Signed   By: Jackey Loge D.O.   On: 12/03/2020 14:11   Result Date: 12/03/2020 CLINICAL DATA:  Code stroke. Neuro deficit, acute, stroke suspected. Altered mental status. Last known well 10:30 a.m. EXAM: CT HEAD WITHOUT CONTRAST TECHNIQUE: Contiguous axial images were obtained from the base of the skull through the vertex without intravenous contrast. COMPARISON:  None FINDINGS: Brain: Mild generalized cerebral and cerebellar atrophy. Mild patchy and ill-defined hypoattenuation within the cerebral white matter, nonspecific but compatible with chronic small vessel ischemic disease. Age-indeterminate lacunar infarct within the central pons (series 3, image 10) (series 5, image 39) (series 6, image 28). There is no acute intracranial hemorrhage. No acute demarcated cortical infarct. No extra-axial fluid collection. No evidence of an intracranial mass. No midline shift. Vascular: No hyperdense vessel.  Atherosclerotic calcifications. Skull: Normal. Negative for fracture or focal lesion. Sinuses/Orbits: Visualized orbits show no acute finding. Trace scattered paranasal sinus mucosal thickening at the imaged levels. ASPECTS ([place]ber[place] Stroke Program Early CT Score) - Ganglionic level infarction (caudate, lentiform nuclei, internal capsule, insula, M1-M3 cortex): 7 - Supraganglionic infarction (M4-M6 cortex): 3 Total score (0-10 with 10 being normal): 10 These results were called by telephone at the time of interpretation on 12/03/2020 at 1:19 pm to provider Dr. Selina Cooley, who verbally acknowledged these results. IMPRESSION: No evidence of acute intracranial hemorrhage or  acute demarcated cortical infarction. Age-indeterminate lacunar infarct within the central pons. Mild chronic small-vessel ischemic changes within the cerebral white matter. Mild generalized parenchymal atrophy. Electronically Signed: By: Jackey Loge D.O. On: 12/03/2020 13:26   CT ANGIO HEAD NECK W WO CM (CODE STROKE)  Result Date: 12/03/2020 CLINICAL DATA:  Neuro deficit, acute, stroke suspected. Altered mental status. EXAM: CT ANGIOGRAPHY HEAD AND NECK TECHNIQUE: Multidetector CT imaging of the head and neck was performed using the standard protocol during bolus administration of intravenous contrast. Multiplanar CT image reconstructions and MIPs were obtained to evaluate the vascular anatomy. Carotid stenosis measurements (when applicable) are obtained utilizing NASCET criteria, using the distal internal carotid diameter as the denominator. CONTRAST:  75mL OMNIPAQUE IOHEXOL 350 MG/ML SOLN COMPARISON:  Noncontrast head CT performed earlier today 12/03/2020. FINDINGS: CTA NECK FINDINGS Aortic arch: Standard aortic branching. Atherosclerotic plaque within the visualized aortic arch and proximal major branch vessels of the neck. Streak  and beam hardening artifact arising from a dense right-sided contrast bolus partially obscures the right subclavian artery. Within this limitation, there is no hemodynamically significant innominate or proximal subclavian artery stenosis. Right carotid system: CCA and ICA patent within the neck. Soft and calcified plaque within the carotid bifurcation and proximal ICA. Apparent high-grade (greater than 70% stenosis within the proximal ICA (series 6, image 173). However, beam hardening artifact and vessel tortuosity limit evaluation of the vessel lumen at this site, and it is suspected that artifact may significantly accentuate this apparent stenosis. Left carotid system: CCA and ICA patent within the neck. Soft and calcified plaque within the carotid bifurcation and proximal ICA.  Calcified plaque results in less than 50% stenosis within the proximal ICA. Vertebral arteries: The right vertebral artery is non dominant. There is severe atherosclerotic stenosis at the origin of the right vertebral artery. The right vertebral artery appears occluded at the distal V2 segment level and at the level of the V2/V3 junction. There is reconstitution of this vessel intracranially, possibly due to retrograde flow. The dominant left vertebral artery is patent throughout the neck without stenosis. Skeleton: No acute bony abnormality or aggressive osseous lesion. Cervical spondylosis, advanced at C6-C7. Cervical levocurvature. Cervicothoracic dextrocurvature more inferiorly. Other neck: No neck mass or cervical lymphadenopathy. Upper chest: No consolidation within the imaged lung apices. Mild fluid distention of the mid to distal esophagus. Review of the MIP images confirms the above findings CTA HEAD FINDINGS Anterior circulation: The intracranial internal carotid arteries are patent. Calcified plaque within both vessels. Moderate stenosis within the cavernous internal carotid arteries bilaterally. The M1 middle cerebral arteries are patent. Atherosclerotic irregularity of the M2 and more distal middle cerebral artery vessels bilaterally. No M2 proximal branch occlusion is identified. The anterior cerebral arteries are patent. No intracranial aneurysm is identified. Posterior circulation: Reconstitution of the V4 right vertebral artery intracranially, which may be due to retrograde flow. The dominant left vertebral artery is patent intracranially without stenosis. The basilar artery is patent proximally. However, there is a focal occlusion of the mid basilar artery measuring 7 mm in length. There is an apparent additional focal occlusion within the distal basilar artery. Enhancement is present within the basilar tip. The posterior cerebral arteries are patent. Both PCAs demonstrated multifocal  atherosclerotic irregularity. Most notably, there is a severe stenosis at the origin of the left posterior cerebral artery, and a severe stenosis within the P2 right posterior cerebral artery. Posterior communicating arteries are hypoplastic or absent bilaterally. Venous sinuses: Within the limitations of contrast timing, no convincing thrombus. 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 IMPRESSION: CTA neck: 1. Severe atherosclerotic stenosis at the origin of the non-dominant right vertebral artery. The vertebral artery appears occluded at the distal V2 segment level, and at the level of the V2/V3 junction. There is reconstitution of the right vertebral artery more distally, possibly due to retrograde flow. 2. The left vertebral artery is dominant and patent throughout the neck without stenosis. 3. The common carotid and internal carotid arteries are patent within the neck. There is apparent high-grade (greater than 70%) focal stenosis of the proximal right ICA. However, beam hardening artifact and vessel tortuosity significantly limit evaluation of the vessel lumen at this site, and it is suspected that  artifact may significantly accentuate this apparent stenosis. A carotid artery duplex examination is recommended for further assessment. Atherosclerotic plaque within the left carotid bifurcation and proximal ICA results in less than 50% stenosis within the proximal left ICA. 4. Mild fluid distention of the visualized mid-to-distal esophagus. Aspiration precautions advised. CTA head: 1. 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 Enhancement is present at the basilar tip. 2. Enhancement is present within the V4 right vertebral artery,  which may be due to retrograde flow (given occlusion of this vessel more proximally). 3. 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 4. Severe stenosis at the origin of the left posterior cerebral artery. 5. Severe stenosis within the P2 right posterior cerebral artery. 6. Moderate stenosis within the cavernous internal carotid arteries, bilaterally. Electronically Signed   By: Jackey Loge D.O.   On: 12/03/2020 14:10    Labs:  CBC: Recent Labs    12/03/20 1302 12/03/20 1753 12/04/20 0516 12/05/20 0543  WBC 13.5*  --  10.1 9.5  HGB 13.4 13.9 14.5 11.0*  HCT 40.9 41.0 36.3* 34.1*  PLT 298  --  308 220    COAGS: Recent Labs    12/03/20 1345 12/03/20 1753  INR 1.0 1.0  APTT 20* 29    BMP: Recent Labs    12/03/20 1345 12/03/20 1753 12/03/20 2235 12/04/20 0516 12/05/20 0543  NA 133* 135 132* 130* 136  K 2.6* 3.1* 3.9 5.4* 3.2*  CL 95*  --  95* 95* 99  CO2 26  --  GLUCOSE 203*  --  115* 141* 159*  BUN 13  --  CALCIUM 8.9  --  8.5* 8.0* 8.2*  CREATININE 1.10  --  0.79 1.05 0.93  GFRNONAA >60  --  >60 >60 >60    LIVER FUNCTION TESTS: Recent Labs    12/03/20 1345 12/04/20 0516  BILITOT 0.6 2.6*  AST 30 RESULTS UNAVAILABLE DUE TO INTERFERING SUBSTANCE  ALT 25 RESULTS UNAVAILABLE DUE TO INTERFERING SUBSTANCE  ALKPHOS 72 59  PROT 7.3 RESULTS UNAVAILABLE DUE TO INTERFERING SUBSTANCE  ALBUMIN 3.9 3.2*    Assessment and Plan:  65 y/o M who presented to [facility] on 9/15 with acute onset right hemiplegia and slurred speech 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 Loreta Ave.   Patient extubated, sitting up in chair, speech is clear and he follows all commands, he denies any complaints today except that he is hungry. Right CFA puncture site  unremarkable. MRI yesterday notes:  Small acute infarcts involving bilateral cerebellum, occipital lobes, left parasagittal pons, and left hippocampus. Minimal petechial hemorrhage. Heidelberg classification 1a: HI1, scattered small petechiae, no mass effect.  - Continue Brilinta and ASA - Further plans per neurology, NIR will follow peripherally - he will follow up with Dr. Loreta Ave in IR clinic in 2-3 months to discuss ongoing follow up (NIR scheduler will call to set this up upon d/c, our contact information has been placed in the AVS)  Please call with questions or concerns.  Electronically Signed: Villa Herb, PA-C 12/05/2020, 12:42 PM   I spent a total of 15 Minutes at the the patient's bedside AND on the patient's hospital floor or unit, greater than 50% of which was counseling/coordinating care for follow up basilar artery thrombectomy/stent placement.

## 2020-12-05 NOTE — Evaluation (Signed)
Speech Language Pathology Evaluation Patient Details Name: Travis Williams MRN: [id number] DOB: Apr 26, 1955 Today's Date: 12/05/2020 Time: 1235-1300 SLP Time Calculation (min) (ACUTE ONLY): 25 min  Problem List:  Patient Active Problem List   Diagnosis Date Noted   Ischemic stroke (HCC) 12/03/2020   Past Medical History: No past medical history on file. Past Surgical History:  HPI:  Travis Williams is a 65 y.o. male who only has PMH known of HTN.  He presented to [facility] 9/15 with R hemiplegia and slurred speech. CT head was negative. 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  He was given TNKase and then required intubation.     He was then transferred to [facility] and was taken to IR and had mechanical thrombectomy with rescue stenting of unstable plaque and mid basilar artery. Of note, IR was unable to pass an OGT due to possible stricture. NGT present, but passed [place]. MD requested formal speech eval.   Assessment / Plan / Recommendation Clinical Impression  Travis Williams presents with speech/language/cognitive function WNL and at baseline. He participated in the [employer] mental Status Examination with a total score of 25/30, which is WNL for less than high school education. Deficits were related to mental manipulation of numbers and paragraph recall. He accurately answered medication frequency and dosage questions as well as finance/calculation questions. Speech was fluent and appropriate. No further ST indicated at this time.    SLP Assessment  SLP Recommendation/Assessment: Patient does not need any further Speech Lanaguage Pathology Services SLP Visit Diagnosis: Dysarthria and anarthria (R47.1);Cognitive communication deficit (R41.841)    Recommendations for follow up therapy are one component of a multi-disciplinary discharge planning process, led by the attending physician.  Recommendations may be updated based on patient status, additional functional criteria  and insurance authorization.    Follow Up Recommendations  None    Frequency and Duration   None        SLP Evaluation Cognition  Overall Cognitive Status: Within Functional Limits for tasks assessed Arousal/Alertness: Awake/alert Orientation Level: Oriented X4 Day of Week: Correct Attention: Focused Focused Attention: Appears intact Memory: Appears intact Executive Function: Organizing;Decision Making Organizing: Appears intact Decision Making: Appears intact Safety/Judgment: Appears intact       Comprehension  Auditory Comprehension Overall Auditory Comprehension: Appears within functional limits for tasks assessed Visual Recognition/Discrimination Discrimination: Within Function Limits    Expression Expression Primary Mode of Expression: Verbal Verbal Expression Overall Verbal Expression: Appears within functional limits for tasks assessed Written Expression Dominant Hand: Right   Oral / Motor  Oral Motor/Sensory Function Overall Oral Motor/Sensory Function: Within functional limits Motor Speech Overall Motor Speech: Appears within functional limits for tasks assessed Respiration: Within functional limits Phonation: Normal Resonance: Within functional limits Articulation: Within functional limitis Intelligibility: Intelligible                     Travis Williams P. Travis Williams, M.S., CCC-SLP Speech-Language Pathologist Acute Rehabilitation Services Pager: [phone]  Travis Williams Travis Williams 12/05/2020, 1:21 PM

## 2020-12-05 NOTE — Evaluation (Addendum)
Physical Therapy Evaluation Patient Details Name: Travis Williams MRN: [id number] DOB: 06/27/1955 Today's Date: 12/05/2020  History of Present Illness  65 y/o presented to ED on 9/15 after being found down with R hemiplegia and slurred speech. Patient received TNK. 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 MRI revealed small acute infarcts involving bilateral cerebellum, occipital lobes, L parasagittal pons, and L hippocampus. Patient underwent mechanical thrombectomy on 9/15. ETT 9/15-9/16. PMH: HTN  Clinical Impression  PTA, patient lives with girlfriend and reports independence and enjoys fishing. Patient presents with mild balance deficits and decreased activity tolerance. Patient modI for bed mobility and transfers. Patient requires supervision for ambulation with no AD and min guard for stair negotiation with use of rail. Patient would benefit from higher level balance challenges next session. Patient will benefit from skilled PT services during acute stay to address listed deficits. No PT follow up recommended at this time.      Recommendations for follow up therapy are one component of a multi-disciplinary discharge planning process, led by the attending physician.  Recommendations may be updated based on patient status, additional functional criteria and insurance authorization.  Follow Up Recommendations No PT follow up;Supervision for mobility/OOB    Equipment Recommendations  None recommended by PT    Recommendations for Other Services       Precautions / Restrictions Precautions Precautions: Fall Restrictions Weight Bearing Restrictions: No      Mobility  Bed Mobility Overal bed mobility: Modified Independent                  Transfers Overall transfer level: Modified independent Equipment used: None                Ambulation/Gait Ambulation/Gait assistance: Supervision Gait Distance (Feet): 200 Feet Assistive device:  None Gait Pattern/deviations: Step-through pattern;Decreased stride length Gait velocity: decrease   General Gait Details: slow guarded gait due to first time being out of bed since admission. No overt LOB noted. Will need to assess higher level balance. Was able to walk backwards 6' in room  Stairs Stairs: Yes Stairs assistance: Min guard Stair Management: One rail Right;Step to pattern;Forwards Number of Stairs: 3 General stair comments: min guard for safety. Patient using R rail for comfort and safety. Educated patient on use of girlfriend's hand during stair ascent/descent for safety. Patient verbalized understanding and want for rail installed at home  Wheelchair Mobility    Modified Rankin (Stroke Patients Only) Modified Rankin (Stroke Patients Only) Pre-Morbid Rankin Score: No symptoms Modified Rankin: Moderately severe disability     Balance Overall balance assessment: Mild deficits observed, not formally tested                                           Pertinent Vitals/Pain Pain Assessment: No/denies pain    Home Living Family/patient expects to be discharged to:: Private residence Living Arrangements: Spouse/significant other Available Help at Discharge: Family;Available 24 hours/day Type of Home: House Home Access: Stairs to enter Entrance Stairs-Rails: None Entrance Stairs-Number of Steps: 3 Home Layout: One level Home Equipment: None      Prior Function Level of Independence: Independent         Comments: enjoys fishing     Hand Dominance   Dominant Hand: Right    Extremity/Trunk Assessment   Upper Extremity Assessment Upper Extremity Assessment: Defer to OT evaluation  Lower Extremity Assessment Lower Extremity Assessment: Overall WFL for tasks assessed (mild coordination deficits initially but improved with mobility)    Cervical / Trunk Assessment Cervical / Trunk Assessment: Normal  Communication   Communication:  No difficulties  Cognition Arousal/Alertness: Awake/alert Behavior During Therapy: WFL for tasks assessed/performed Overall Cognitive Status: Within Functional Limits for tasks assessed                                        General Comments General comments (skin integrity, edema, etc.): On 4L O2 [place] on arrival, spO2 100% reduced to 2L O2 with spO2 maintaining >98%. Removed O2 for ambulation with spO2 maintaining >94% throughout. Donned 2L O2 at end of session for comfort    Exercises     Assessment/Plan    PT Assessment Patient needs continued PT services  PT Problem List Decreased strength;Decreased balance;Decreased activity tolerance       PT Treatment Interventions DME instruction;Gait training;Stair training;Functional mobility training;Therapeutic activities;Therapeutic exercise;Balance training;Patient/family education    PT Goals (Current goals can be found in the Care Plan section)  Acute Rehab PT Goals Patient Stated Goal: to get better and go home PT Goal Formulation: With patient Time For Goal Achievement: 12/19/20 Potential to Achieve Goals: Good    Frequency Min 4X/week   Barriers to discharge        Co-evaluation               AM-PAC PT "6 Clicks" Mobility  Outcome Measure Help needed turning from your back to your side while in a flat bed without using bedrails?: None Help needed moving from lying on your back to sitting on the side of a flat bed without using bedrails?: None Help needed moving to and from a bed to a chair (including a wheelchair)?: None Help needed standing up from a chair using your arms (e.g., wheelchair or bedside chair)?: None Help needed to walk in hospital room?: A Little Help needed climbing 3-5 steps with a railing? : A Little 6 Click Score: 22    End of Session Equipment Utilized During Treatment: Gait belt Activity Tolerance: Patient tolerated treatment well Patient left: in chair;with call bell/phone  within reach;with chair alarm set Nurse Communication: Mobility status PT Visit Diagnosis: Unsteadiness on feet (R26.81);Muscle weakness (generalized) (M62.81)    Time: 7062-3762 PT Time Calculation (min) (ACUTE ONLY): 29 min   Charges:   PT Evaluation $PT Eval Low Complexity: 1 Low          Galan Ghee A. Dan Humphreys PT, DPT Acute Rehabilitation Services Pager [phone] Office [phone]   Viviann Spare 12/05/2020, 10:49 AM

## 2020-12-05 NOTE — Plan of Care (Signed)
  Problem: Education: Goal: Knowledge of disease or condition will improve Outcome: Progressing Goal: Knowledge of secondary prevention will improve Outcome: Progressing   

## 2020-12-05 NOTE — Progress Notes (Signed)
STROKE TEAM PROGRESS NOTE   INTERVAL HISTORY No acute events. Pt sitting in chair, off cardene, passed swallow and on diet. Will d/c cortrak and TF. Continue on unasyn. Still has mild dysarthria and slight RUE pronator drift and facial droop but no ataxia or nystagmus.   Vitals:   12/05/20 1000 12/05/20 1100 12/05/20 1121 12/05/20 1200  BP:   138/86 (!) 142/75  Pulse: (!) 103 83 85 86  Resp: (!) 21 19 18 19   Temp:    98.8 F (37.1 C)  TempSrc:    Axillary  SpO2: 94% 96% 97% 98%  Height:       CBC:  Recent Labs  Lab 12/03/20 1302 12/03/20 1753 12/04/20 0516 12/05/20 0543  WBC 13.5*  --  10.1 9.5  NEUTROABS 8.8*  --   --   --   HGB 13.4   < > 14.5 11.0*  HCT 40.9   < > 36.3* 34.1*  MCV 86.8  --  87.9 89.0  PLT 298  --  308 220   < > = values in this interval not displayed.   Basic Metabolic Panel:  Recent Labs  Lab 12/04/20 0516 12/04/20 1752 12/05/20 0543  NA 130*  --  136  K 5.4*  --  3.2*  CL 95*  --  99  CO2 22  --  26  GLUCOSE 141*  --  159*  BUN 12  --  13  CREATININE 1.05  --  0.93  CALCIUM 8.0*  --  8.2*  MG  --  2.7* 2.4  PHOS  --  3.9 4.3   Lipid Panel:  Recent Labs  Lab 12/04/20 0516 12/04/20 0943 12/05/20 0543  CHOL 189  --   --   TRIG 2,188*   < > 205*  HDL NOT REPORTED DUE TO HIGH TRIGLYCERIDES  --   --   CHOLHDL NOT REPORTED DUE TO HIGH TRIGLYCERIDES  --   --   VLDL UNABLE TO CALCULATE IF TRIGLYCERIDE OVER 400 mg/dL  --   --   LDLCALC UNABLE TO CALCULATE IF TRIGLYCERIDE OVER 400 mg/dL  --   --    < > = values in this interval not displayed.   HgbA1c:  Recent Labs  Lab 12/04/20 0516  HGBA1C 5.7*   Urine Drug Screen:  Recent Labs  Lab 12/03/20 1803  LABOPIA NONE DETECTED  COCAINSCRNUR NONE DETECTED  LABBENZ NONE DETECTED  AMPHETMU NONE DETECTED  THCU POSITIVE*  LABBARB NONE DETECTED    Alcohol Level  Recent Labs  Lab 12/03/20 2235  ETH <10    IMAGING past 24 hours MR BRAIN WO CONTRAST  Result Date: 12/04/2020 CLINICAL  DATA:  Stroke, follow-up; post TNK and thrombectomy EXAM: MRI HEAD WITHOUT CONTRAST TECHNIQUE: Multiplanar, multiecho pulse sequences of the brain and surrounding structures were obtained without intravenous contrast. COMPARISON:  Correlation made with recent CT imaging. FINDINGS: Brain: Foci of mildly reduced diffusion are present within the cerebellum and occipital lobes bilaterally as well as the left parasagittal pons and left hippocampus. There is minimal susceptibility hypointensity some of these areas. Additional patchy and confluent areas of T2 hyperintensity in the supratentorial white matter are nonspecific but may reflect mild to moderate chronic microvascular ischemic changes. There is no intracranial mass or mass effect. There is no hydrocephalus or extra-axial fluid collection. Vascular: Major vessel flow voids at the skull base are preserved. Skull and upper cervical spine: Normal marrow signal is preserved. Sinuses/Orbits: Paranasal sinuses are aerated. Orbits are unremarkable.  Other: Sella is unremarkable.  Mastoid air cells are clear. IMPRESSION: Small acute infarcts involving bilateral cerebellum, occipital lobes, left parasagittal pons, and left hippocampus. Minimal petechial hemorrhage. Heidelberg classification 1a: HI1, scattered small petechiae, no mass effect. Chronic microvascular ischemic changes. Electronically Signed   By: Guadlupe Spanish M.D.   On: 12/04/2020 17:05   ECHOCARDIOGRAM COMPLETE  Result Date: 12/05/2020    ECHOCARDIOGRAM REPORT   Patient Name:   Travis Williams Date of Exam: 12/05/2020 Medical Rec #:  [id number]    Height:       71.0 in Accession #:    [id number]   Weight:       220.0 lb Date of Birth:  04/03/1955    BSA:          2.196 m Patient Age:    65 years     BP:           138/86 mmHg Patient Gender: M            HR:           84 bpm. Exam Location:  Inpatient Procedure: 2D Echo, Color Doppler and Cardiac Doppler Indications:    Stroke I63.9  History:        Patient has  no prior history of Echocardiogram examinations.                 Ischemic Stroke, only has PMH known of HTN.  Sonographer:    Jeryl Columbia RDCS Referring Phys: [id number] Reyne Dumas [facility] IMPRESSIONS  1. Left ventricular ejection fraction, by estimation, is 70 to 75%. The left ventricle has hyperdynamic function. The left ventricle has no regional wall motion abnormalities. Left ventricular diastolic parameters were normal.  2. Right ventricular systolic function is normal. The right ventricular size is normal. Tricuspid regurgitation signal is inadequate for assessing PA pressure.  3. The mitral valve is grossly normal. Trivial mitral valve regurgitation. No evidence of mitral stenosis.  4. The aortic valve is tricuspid. Aortic valve regurgitation is not visualized. No aortic stenosis is present.  5. The inferior vena cava is normal in size with greater than 50% respiratory variability, suggesting right atrial pressure of 3 mmHg. Conclusion(s)/Recommendation(s): No intracardiac source of embolism detected on this transthoracic study. A transesophageal echocardiogram is recommended to exclude cardiac source of embolism if clinically indicated. FINDINGS  Left Ventricle: Left ventricular ejection fraction, by estimation, is 70 to 75%. The left ventricle has hyperdynamic function. The left ventricle has no regional wall motion abnormalities. The left ventricular internal cavity size was normal in size. There is no left ventricular hypertrophy. Left ventricular diastolic parameters were normal. Right Ventricle: The right ventricular size is normal. No increase in right ventricular wall thickness. Right ventricular systolic function is normal. Tricuspid regurgitation signal is inadequate for assessing PA pressure. Left Atrium: Left atrial size was normal in size. Right Atrium: Right atrial size was normal in size. Pericardium: Trivial pericardial effusion is present. Presence of pericardial fat pad. Mitral Valve: The  mitral valve is grossly normal. Trivial mitral valve regurgitation. No evidence of mitral valve stenosis. Tricuspid Valve: The tricuspid valve is grossly normal. Tricuspid valve regurgitation is trivial. No evidence of tricuspid stenosis. Aortic Valve: The aortic valve is tricuspid. Aortic valve regurgitation is not visualized. No aortic stenosis is present. Pulmonic Valve: The pulmonic valve was grossly normal. Pulmonic valve regurgitation is not visualized. No evidence of pulmonic stenosis. Aorta: The aortic root and ascending aorta are structurally normal, with no evidence of dilitation. Venous:  The inferior vena cava is normal in size with greater than 50% respiratory variability, suggesting right atrial pressure of 3 mmHg. IAS/Shunts: The atrial septum is grossly normal.  LEFT VENTRICLE PLAX 2D LVIDd:         4.80 cm  Diastology LVIDs:         3.40 cm  LV e' medial:    7.18 cm/s LV PW:         1.00 cm  LV E/e' medial:  14.2 LV IVS:        1.10 cm  LV e' lateral:   9.03 cm/s LVOT diam:     1.90 cm  LV E/e' lateral: 11.3 LV SV:         78 LV SV Index:   36 LVOT Area:     2.84 cm  RIGHT VENTRICLE RV S prime:     17.60 cm/s TAPSE (M-mode): 2.6 cm LEFT ATRIUM             Index       RIGHT ATRIUM          Index LA diam:        3.20 cm 1.46 cm/m  RA Area:     8.35 cm LA Vol (A2C):   61.5 ml 28.01 ml/m RA Volume:   13.10 ml 5.97 ml/m LA Vol (A4C):   37.2 ml 16.94 ml/m LA Biplane Vol: 47.5 ml 21.63 ml/m  AORTIC VALVE LVOT Vmax:   163.00 cm/s LVOT Vmean:  107.000 cm/s LVOT VTI:    0.275 m  AORTA Ao Root diam: 2.60 cm MITRAL VALVE MV Area (PHT): 4.10 cm     SHUNTS MV Decel Time: 185 msec     Systemic VTI:  0.28 m MV E velocity: 102.00 cm/s  Systemic Diam: 1.90 cm MV A velocity: 127.00 cm/s MV E/A ratio:  0.80 Lennie Odor MD Electronically signed by Lennie Odor MD Signature Date/Time: 12/05/2020/12:56:22 PM    Final     PHYSICAL EXAM  Temp:  [97.7 F (36.5 C)-99 F (37.2 C)] 98.8 F (37.1 C) (09/17  1200) Pulse Rate:  [78-117] 86 (09/17 1200) Resp:  [11-34] 19 (09/17 1200) BP: (104-164)/(63-94) 142/75 (09/17 1200) SpO2:  [86 %-100 %] 98 % (09/17 1200) Arterial Line BP: (101-206)/(60-97) 101/97 (09/17 0800)  General - Well nourished, well developed, in no apparent distress.  Ophthalmologic - fundi not visualized due to noncooperation.  Cardiovascular - Regular rhythm and rate.  Mental Status -  Level of arousal and orientation to time, place, and person were intact. Language including expression, naming, repetition, comprehension was assessed and found intact. Mild dysarthria Fund of Knowledge was assessed and was intact.  Cranial Nerves II - XII - II - Visual field intact OU. III, IV, VI - Extraocular movements intact. V - Facial sensation intact bilaterally. VII - mild right facial droop. VIII - Hearing & vestibular intact bilaterally. X - Palate elevates symmetrically. XI - Chin turning & shoulder shrug intact bilaterally. XII - Tongue protrusion intact.  Motor Strength - The patient's strength was normal in all extremities and pronator drift was absent.  Bulk was normal and fasciculations were absent.   Motor Tone - Muscle tone was assessed at the neck and appendages and was normal.  Reflexes - The patient's reflexes were symmetrical in all extremities and he had no pathological reflexes.  Sensory - Light touch, temperature/pinprick were assessed and were symmetrical.    Coordination - The patient had normal movements in the hands and  feet with no ataxia or dysmetria.  Tremor was absent.  Gait and Station - deferred.   ASSESSMENT/PLAN Travis Williams is a 65 y.o. male who only has PMH known of HTN.  He presented to [facility] 9/15 with R hemiplegia and slurred speech. NIHSS 16. Last known well at 1030 and found on floor at 1230CT head was negative. 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 He was given TNKase and then required intubation. CTA  showed multifocal basilar occlusion and R vertebral occlusion V2/V3. IR team, Dr. Loreta Ave, emergently consulted and patient was emergently taken to IR.    Stroke - posterior scattered infarct due to BA and R VA occlusion s/p  thrombectomy with TICI3 and BA stent placement, etiology likely due to large vessel disease. CT No evidence of acute intracranial hemorrhage or acute demarcated cortical infarction. Age-indeterminate lacunar infarct within the central pons. CTA head & neck 1. 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 Enhancement is present at the basilar tip. 2. Enhancement is present within the R V4, which may be due to retrograde flow (given occlusion of this vessel more proximally). 3. 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 4. Severe stenosis at the origin of the left PCA. 5. Severe stenosis within the P2 right PCA. S/p IR with TICI3 reperfusion and basilar artery stent placement MRI scattered bilateral punctate cerebellar, brainstem and occipital lobe infarcts.  No hemorrhage  2D Echo EF 70 to 75% LDL pending HgbA1c 5.7 VTE prophylaxis -SCDs No antiplatelet or anticoagulant prior to admission, currently on aspirin 81 and Brilinta 90 twice daily post bilateral stenting Therapy recommendations: None Disposition: Pending  Hypertension Home meds: Chlorthalidone 25mg  on hold  Off Cardene On lisinopril 10 Avoid low BP Long-term BP goal 130-150 given multifocal severe intracranial stenosis  Respiratory Failure Aspiration PNA Extubated tolerating well Management per CCM appreciated On 5 day course of Unasyn   Hyperlipidemia Home meds:  None LDL pending On Lipitor 40 Continue statin at discharge      Dysphagia Cortrak placed -> TF -> now passed swallow -> on diet -> cortrak d/c.  Speech on board    Other Stroke Risk Factors Advanced Age >/= 55  Former Cigarette smoker Substance abuse - UDS:  THC  POSITIVE. Patient advised to stop using due to stroke risk. Obesity, Body mass index is 30.69 kg/m., BMI >/= 30 associated with increased stroke risk, will recommend weight loss, diet and exercise as appropriate   Other Active Problems  Hospital day # 2  This patient is critically ill due to basilar artery occlusion status post IR, aspiration pneumonia on antibiotics, respite failure status post intubation, severe intracranial stenosis and at significant risk of neurological worsening, death form recurrent stroke, hemorrhagic conversion, seizure, sepsis. This patient's care requires constant monitoring of vital signs, hemodynamics, respiratory and cardiac monitoring, review of multiple databases, neurological assessment, discussion with family, other specialists and medical decision making of high complexity. I spent 40 minutes of neurocritical care time in the care of this patient.  75, MD PhD Stroke Neurology 12/05/2020 8:28 PM  To contact Stroke Continuity provider, please refer to 12/07/2020. After hours, contact General Neurology

## 2020-12-05 NOTE — Progress Notes (Signed)
Occupational Therapy Evaluation Patient Details Name: Travis Williams MRN: [id number] DOB: 09/06/1955 Today's Date: 12/05/2020  Clinical Impression: Pt typically independent in ADL and mobility, retired Journalist, newspaper. Today is overall mod I/Supervision in hospital setting due to it being the first time on his feet. He was able to complete LB dressing, standing ADL activities for UB and LB, navigate environment. At this time education complete, and OT will sign off at this time.     12/05/20 1055  OT Visit Information  Last OT Received On 12/05/20  Assistance Needed +1  PT/OT/SLP Co-Evaluation/Treatment Yes  Reason for Co-Treatment For patient/therapist safety;To address functional/ADL transfers  PT goals addressed during session Mobility/safety with mobility;Balance;Strengthening/ROM  OT goals addressed during session ADL's and self-care;Strengthening/ROM  History of Present Illness 65 y/o presented to ED on 9/15 after being found down with R hemiplegia and slurred speech. Patient received TNK. 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 MRI revealed small acute infarcts involving bilateral cerebellum, occipital lobes, L parasagittal pons, and L hippocampus. Patient underwent mechanical thrombectomy on 9/15. ETT 9/15-9/16. PMH: HTN  Precautions  Precautions Fall  Home Living  Family/patient expects to be discharged to: Private residence  Living Arrangements Spouse/significant other  Available Help at Discharge Family;Available 24 hours/day  Type of Home House  Home Access Stairs to enter  Entrance Stairs-Number of Steps 3  Entrance Stairs-Rails None  Home Layout One level  Higher education careers adviser None  Prior Function  Level of Independence Independent  Comments enjoys fishing  Communication  Communication No difficulties  Cognition  Arousal/Alertness Awake/alert  Behavior During Therapy WFL for tasks  assessed/performed  Overall Cognitive Status Within Functional Limits for tasks assessed  Upper Extremity Assessment  Upper Extremity Assessment Overall WFL for tasks assessed  Lower Extremity Assessment  Lower Extremity Assessment Defer to PT evaluation  Cervical / Trunk Assessment  Cervical / Trunk Assessment Normal  ADL  Overall ADL's  Needs assistance/impaired  Eating/Feeding Independent  Grooming Supervision/safety;Standing  Upper Body Bathing Modified independent  Lower Body Bathing Modified independent  Upper Body Dressing  Modified independent  Lower Body Dressing Supervision/safety  Toilet Transfer Supervision/safety  Toileting- Clothing Manipulation and Hygiene Modified independent  Functional mobility during ADLs Supervision/safety  General ADL Comments supervision level due to initial evaluation and first time OOB  Vision- History  Baseline Vision/History 1 Wears glasses (reading only)  Ability to See in Adequate Light 0 Adequate  Patient Visual Report No change from baseline  Vision- Assessment  Vision Assessment? No apparent visual deficits  Bed Mobility  Overal bed mobility Modified Independent  Transfers  Overall transfer level Modified independent  Equipment used None  Balance  Overall balance assessment Mild deficits observed, not formally tested  General Comments  General comments (skin integrity, edema, etc.) On 4L O2 [place] on arrival, spO2 100% reduced to 2L O2 with spO2 maintaining >98%. Removed O2 for ambulation with spO2 maintaining >94% throughout. Donned 2L O2 at end of session for comfort  OT - End of Session  Equipment Utilized During Treatment Gait belt  Activity Tolerance Patient tolerated treatment well  Patient left in chair;with call bell/phone within reach  Nurse Communication Mobility status  OT Assessment  OT Recommendation/Assessment Patient does not need any further OT services  OT Visit Diagnosis Muscle weakness (generalized)  (M62.81);Other symptoms and signs involving the nervous system (R29.898)  OT Problem List Decreased activity tolerance  AM-PAC OT "6 Clicks" Daily Activity Outcome  Measure (Version 2)  Help from another person eating meals? 4  Help from another person taking care of personal grooming? 4  Help from another person toileting, which includes using toliet, bedpan, or urinal? 4  Help from another person bathing (including washing, rinsing, drying)? 4  Help from another person to put on and taking off regular upper body clothing? 4  Help from another person to put on and taking off regular lower body clothing? 4  6 Click Score 24  Progressive Mobility  What is the highest level of mobility based on the progressive mobility assessment? Level 5 (Walks with assist in room/hall) - Balance while stepping forward/back and can walk in room with assist - Complete  Mobility Ambulated with assistance in hallway  OT Recommendation  Follow Up Recommendations No OT follow up  OT Equipment None recommended by OT  Acute Rehab OT Goals  Patient Stated Goal to get better and go home  OT Goal Formulation With patient  OT Time Calculation  OT Start Time (ACUTE ONLY) 0943  OT Stop Time (ACUTE ONLY) 1012  OT Time Calculation (min) 29 min  OT General Charges  $OT Visit 1 Visit  OT Evaluation  $OT Eval Moderate Complexity 1 Mod  Written Expression  Dominant Hand Right   Nyoka Cowden OTR/L Acute Rehabilitation Services Pager: [phone] Office: [phone]

## 2020-12-06 DIAGNOSIS — I651 Occlusion and stenosis of basilar artery: Secondary | ICD-10-CM | POA: Diagnosis not present

## 2020-12-06 DIAGNOSIS — I6389 Other cerebral infarction: Secondary | ICD-10-CM | POA: Diagnosis not present

## 2020-12-06 DIAGNOSIS — I639 Cerebral infarction, unspecified: Secondary | ICD-10-CM | POA: Diagnosis not present

## 2020-12-06 DIAGNOSIS — I152 Hypertension secondary to endocrine disorders: Secondary | ICD-10-CM | POA: Diagnosis not present

## 2020-12-06 DIAGNOSIS — E781 Pure hyperglyceridemia: Secondary | ICD-10-CM

## 2020-12-06 DIAGNOSIS — E1159 Type 2 diabetes mellitus with other circulatory complications: Secondary | ICD-10-CM

## 2020-12-06 LAB — LIPID PANEL
Cholesterol: 199 mg/dL (ref 0–200)
HDL: 39 mg/dL — ABNORMAL LOW (ref >40)
LDL Cholesterol: 136 mg/dL — ABNORMAL HIGH (ref 0–99)
Total CHOL/HDL Ratio: 5.1 RATIO
Triglycerides: 122 mg/dL (ref <150)
VLDL: 24 mg/dL (ref 0–40)

## 2020-12-06 LAB — BASIC METABOLIC PANEL
Anion gap: 11 (ref 5–15)
BUN: 12 mg/dL (ref 8–23)
CO2: 25 mmol/L (ref 22–32)
Calcium: 8.6 mg/dL — ABNORMAL LOW (ref 8.9–10.3)
Chloride: 99 mmol/L (ref 98–111)
Creatinine, Ser: 0.93 mg/dL (ref 0.61–1.24)
GFR, Estimated: >60 mL/min (ref >60)
Glucose, Bld: 128 mg/dL — ABNORMAL HIGH (ref 70–99)
Potassium: 3.8 mmol/L (ref 3.5–5.1)
Sodium: 135 mmol/L (ref 135–145)

## 2020-12-06 LAB — CULTURE, RESPIRATORY W GRAM STAIN: Culture: NORMAL

## 2020-12-06 LAB — CBC
HCT: 35.6 % — ABNORMAL LOW (ref 39.0–52.0)
Hemoglobin: 11.7 g/dL — ABNORMAL LOW (ref 13.0–17.0)
MCH: 28.3 pg (ref 26.0–34.0)
MCHC: 32.9 g/dL (ref 30.0–36.0)
MCV: 86.2 fL (ref 80.0–100.0)
Platelets: 229 10*3/uL (ref 150–400)
RBC: 4.13 MIL/uL — ABNORMAL LOW (ref 4.22–5.81)
RDW: 13.8 % (ref 11.5–15.5)
WBC: 12.6 10*3/uL — ABNORMAL HIGH (ref 4.0–10.5)
nRBC: 0 % (ref 0.0–0.2)

## 2020-12-06 LAB — PHOSPHORUS: Phosphorus: 3.9 mg/dL (ref 2.5–4.6)

## 2020-12-06 LAB — MAGNESIUM: Magnesium: 2 mg/dL (ref 1.7–2.4)

## 2020-12-06 MED ORDER — AMOXICILLIN-POT CLAVULANATE 875-125 MG PO TABS: 1.0000 | ORAL_TABLET | Freq: Two times a day (BID) | ORAL | 0 refills | Status: AC

## 2020-12-06 MED ORDER — LISINOPRIL 10 MG PO TABS: 10.0000 mg | ORAL_TABLET | Freq: Every day | ORAL | 0 refills | Status: DC

## 2020-12-06 MED ORDER — AMOXICILLIN-POT CLAVULANATE 875-125 MG PO TABS: 1.0000 | ORAL_TABLET | Freq: Two times a day (BID) | ORAL | Status: DC

## 2020-12-06 MED ORDER — PANTOPRAZOLE SODIUM 40 MG PO TBEC
40.0000 mg | DELAYED_RELEASE_TABLET | Freq: Every day | ORAL | Status: DC
  Administered 2020-12-06: 40 mg via ORAL

## 2020-12-06 MED ORDER — AMLODIPINE BESYLATE 10 MG PO TABS: 10.0000 mg | ORAL_TABLET | Freq: Every day | ORAL | 1 refills | Status: DC

## 2020-12-06 MED ORDER — AMOXICILLIN-POT CLAVULANATE 875-125 MG PO TABS
1.0000 | ORAL_TABLET | Freq: Two times a day (BID) | ORAL | Status: DC
  Filled 2020-12-06: qty 1

## 2020-12-06 MED ORDER — TICAGRELOR 90 MG PO TABS: 90.0000 mg | ORAL_TABLET | Freq: Two times a day (BID) | ORAL | 2 refills | Status: DC

## 2020-12-06 MED ORDER — ATORVASTATIN CALCIUM 40 MG PO TABS: 40.0000 mg | ORAL_TABLET | Freq: Every day | ORAL | 1 refills | Status: DC

## 2020-12-06 MED ORDER — AMOXICILLIN-POT CLAVULANATE 875-125 MG PO TABS
1.0000 | ORAL_TABLET | Freq: Two times a day (BID) | ORAL | Status: DC
  Filled 2020-12-06 (×2): qty 1

## 2020-12-06 MED ORDER — ASPIRIN 81 MG PO CHEW: 81.0000 mg | CHEWABLE_TABLET | Freq: Every day | ORAL | Status: DC

## 2020-12-06 NOTE — Discharge Summary (Addendum)
Stroke Discharge Summary  Patient ID: Travis Williams   MRN: [id number]      DOB: 08/13/1955  Date of Admission: 12/03/2020 Date of Discharge: 12/06/2020  Attending Physician: Dr. Marvel Plan,  Stroke MD Patient's PCP:  [facility]  DISCHARGE DIAGNOSIS:  Stroke - posterior scattered infarct due to [facility] occlusion s/p  thrombectomy with TICI3 and BA stent placement, etiology likely due to large vessel disease. Respiratory Failure s/p mechanical ventilation Aspiration PNA Dysarthria  Active Problems:   Ischemic stroke (HCC)   Allergies as of 12/06/2020   No Known Allergies      Medication List     STOP taking these medications    chlorthalidone 25 MG tablet Commonly known as: HYGROTON       TAKE these medications    amLODipine 10 MG tablet Commonly known as: NORVASC Take 1 tablet (10 mg total) by mouth daily. Start taking on: December 07, 2020   amoxicillin-clavulanate 875-125 MG tablet Commonly known as: AUGMENTIN Take 1 tablet by mouth every 12 (twelve) hours for 3 days.   aspirin 81 MG chewable tablet Chew 1 tablet (81 mg total) by mouth daily. Start taking on: December 07, 2020   atorvastatin 40 MG tablet Commonly known as: LIPITOR Take 1 tablet (40 mg total) by mouth daily. Start taking on: December 07, 2020   lisinopril 10 MG tablet Commonly known as: ZESTRIL Take 1 tablet (10 mg total) by mouth daily. Start taking on: December 07, 2020   ticagrelor 90 MG Tabs tablet Commonly known as: BRILINTA Take 1 tablet (90 mg total) by mouth 2 (two) times daily.        LABORATORY STUDIES CBC    Component Value Date/Time   WBC 12.6 (H) 12/06/2020 0317   RBC 4.13 (L) 12/06/2020 0317   HGB 11.7 (L) 12/06/2020 0317   HCT 35.6 (L) 12/06/2020 0317   PLT 229 12/06/2020 0317   MCV 86.2 12/06/2020 0317   MCH 28.3 12/06/2020 0317   MCHC 32.9 12/06/2020 0317   RDW 13.8 12/06/2020 0317   LYMPHSABS 3.7 12/03/2020 1302   MONOABS 0.8  12/03/2020 1302   EOSABS 0.1 12/03/2020 1302   BASOSABS 0.1 12/03/2020 1302   CMP    Component Value Date/Time   NA 135 12/06/2020 0317   K 3.8 12/06/2020 0317   CL 99 12/06/2020 0317   CO2 25 12/06/2020 0317   GLUCOSE 128 (H) 12/06/2020 0317   BUN 12 12/06/2020 0317   CREATININE 0.93 12/06/2020 0317   CALCIUM 8.6 (L) 12/06/2020 0317   PROT RESULTS UNAVAILABLE DUE TO INTERFERING SUBSTANCE 12/04/2020 0516   ALBUMIN 3.2 (L) 12/04/2020 0516   AST RESULTS UNAVAILABLE DUE TO INTERFERING SUBSTANCE 12/04/2020 0516   ALT RESULTS UNAVAILABLE DUE TO INTERFERING SUBSTANCE 12/04/2020 0516   ALKPHOS 59 12/04/2020 0516   BILITOT 2.6 (H) 12/04/2020 0516   GFRNONAA >60 12/06/2020 0317   COAGS Lab Results  Component Value Date   INR 1.0 12/03/2020   INR 1.0 12/03/2020   Lipid Panel    Component Value Date/Time   CHOL 199 12/06/2020 0317   TRIG 122 12/06/2020 0317   HDL 39 (L) 12/06/2020 0317   CHOLHDL 5.1 12/06/2020 0317   VLDL 24 12/06/2020 0317   LDLCALC 136 (H) 12/06/2020 0317   HgbA1C  Lab Results  Component Value Date   HGBA1C 5.7 (H) 12/04/2020   Urinalysis    Component Value Date/Time   COLORURINE STRAW (A) 12/03/2020 1803  APPEARANCEUR CLEAR 12/03/2020 1803   LABSPEC 1.041 (H) 12/03/2020 1803   PHURINE 8.0 12/03/2020 1803   GLUCOSEU NEGATIVE 12/03/2020 1803   HGBUR NEGATIVE 12/03/2020 1803   BILIRUBINUR NEGATIVE 12/03/2020 1803   KETONESUR NEGATIVE 12/03/2020 1803   PROTEINUR NEGATIVE 12/03/2020 1803   NITRITE NEGATIVE 12/03/2020 1803   LEUKOCYTESUR NEGATIVE 12/03/2020 1803   Urine Drug Screen     Component Value Date/Time   LABOPIA NONE DETECTED 12/03/2020 1803   COCAINSCRNUR NONE DETECTED 12/03/2020 1803   LABBENZ NONE DETECTED 12/03/2020 1803   AMPHETMU NONE DETECTED 12/03/2020 1803   THCU POSITIVE (A) 12/03/2020 1803   LABBARB NONE DETECTED 12/03/2020 1803    Alcohol Level    Component Value Date/Time   ETH <10 12/03/2020 2235   SIGNIFICANT  DIAGNOSTIC STUDIES CT HEAD WO CONTRAST  Result Date: 12/03/2020 CLINICAL DATA:  Stroke EXAM: CT HEAD WITHOUT CONTRAST TECHNIQUE: Contiguous axial images were obtained from the base of the skull through the vertex without intravenous contrast. COMPARISON:  12/03/2020 FINDINGS: Brain: Interval stent placement within the basilar artery. Interval development of hypodensities within the bilateral cerebellar hemispheres, right greater than left, which could reflect evolving infarcts. Chronic small vessel ischemic changes are identified within the periventricular white matter, stable. No signs of acute hemorrhage. Lateral ventricles and remaining midline structures are unremarkable. No acute extra-axial fluid collections. No mass effect. Vascular: Stable atherosclerosis of the internal carotid arteries. New stent within the basilar artery. No hyperdense vessel. Skull: Normal. Negative for fracture or focal lesion. Sinuses/Orbits: Diffuse mucosal thickening throughout the ethmoid, sphenoid, and maxillary sinuses may be due to intubation. Other: None. IMPRESSION: 1. Subtle hypodensities within the bilateral cerebellar hemispheres which could reflect areas of evolving infarction. 2. No evidence of intracranial hemorrhage. 3. Interval placement of a basilar artery stent. Electronically Signed   By: Sharlet Salina M.D.   On: 12/03/2020 22:00   MR BRAIN WO CONTRAST  Result Date: 12/04/2020 CLINICAL DATA:  Stroke, follow-up; post TNK and thrombectomy EXAM: MRI HEAD WITHOUT CONTRAST TECHNIQUE: Multiplanar, multiecho pulse sequences of the brain and surrounding structures were obtained without intravenous contrast. COMPARISON:  Correlation made with recent CT imaging. FINDINGS: Brain: Foci of mildly reduced diffusion are present within the cerebellum and occipital lobes bilaterally as well as the left parasagittal pons and left hippocampus. There is minimal susceptibility hypointensity some of these areas. Additional patchy  and confluent areas of T2 hyperintensity in the supratentorial white matter are nonspecific but may reflect mild to moderate chronic microvascular ischemic changes. There is no intracranial mass or mass effect. There is no hydrocephalus or extra-axial fluid collection. Vascular: Major vessel flow voids at the skull base are preserved. Skull and upper cervical spine: Normal marrow signal is preserved. Sinuses/Orbits: Paranasal sinuses are aerated. Orbits are unremarkable. Other: Sella is unremarkable.  Mastoid air cells are clear. IMPRESSION: Small acute infarcts involving bilateral cerebellum, occipital lobes, left parasagittal pons, and left hippocampus. Minimal petechial hemorrhage. Heidelberg classification 1a: HI1, scattered small petechiae, no mass effect. Chronic microvascular ischemic changes. Electronically Signed   By: Guadlupe Spanish M.D.   On: 12/04/2020 17:05   IR Intra Cran Stent  Result Date: 12/04/2020 INDICATION: 65 year old male presents with acute basilar occlusion for mechanical thrombectomy EXAM: ULTRASOUND-GUIDED ACCESS RIGHT COMMON FEMORAL ARTERY CERVICAL AND CEREBRAL ANGIOGRAM MECHANICAL THROMBECTOMY BASILAR ARTERY RESCUE STENT UNSTABLE PLAQUE BASILAR ARTERY ANGIO-SEAL DEPLOYED FOR HEMOSTASIS COMPARISON:  CT imaging of the same day MEDICATIONS: Intravenous kangrelor bolus and initiation of drip ANESTHESIA/SEDATION: The anesthesia team was present  to provide general endotracheal tube anesthesia and for patient monitoring during the procedure. Intubation was performed before arrival to the neuro angio suite. Left radial arterial line was performed by the anesthesia team. Interventional neuro radiology nursing staff was also present. CONTRAST:  200 cc FLUOROSCOPY TIME:  Fluoroscopy Time: 23 minutes 0 seconds (2,527 mGy). COMPLICATIONS: None TECHNIQUE: Emergent consent was implied, with assumed procedural risks, benefits and alternatives. Specific risks include: Bleeding, infection, contrast  reaction, kidney injury/failure, need for further procedure/surgery, arterial injury or dissection, embolization to new territory, intracranial hemorrhage (10-15% risk), neurologic deterioration, cardiopulmonary collapse, death. Maximal Sterile Barrier Technique was utilized including during the procedure including caps, mask, sterile gowns, sterile gloves, sterile drape, hand hygiene and skin antiseptic. A timeout was performed prior to the initiation of the procedure. The anesthesia team was present to provide general endotracheal tube anesthesia and for patient monitoring during the procedure. Interventional neuro radiology nursing staff was also present. FINDINGS: Initial Findings: Left vertebral artery: Normal course caliber and contour. Basilar artery: Short segment critical stenosis within the mid basilar artery, corresponding to findings on the preoperative CT angiogram. Contrast penetrates beyond the occlusion, but with minimal filling of the territory beyond the P1 arteries and superior cerebellar arteries. There is a fairly rounded luminal filling defect at the region of stenosis, with the most likely diagnosis being unstable ruptured atherosclerotic plaque. Initial TICI flow: TICI 1. Mild irregularity in the proximal basilar artery and the right vertebral artery compatible with intracranial atherosclerotic changes. Completion Findings: After the initial aspiration thrombectomy there is complete restoration of flow through the segment, with adequate distal perfusion, compatible with TICI 3: Complete perfusion of the territory. After observation for 5 minutes-10 minutes there is reocclusion at the unstable plaque site. A second aspiration thrombectomy reopened this segment, and then rescue stenting was performed with balloon mounted 2.5 mm x 8 mm on X resolution stent, as described in the procedural note. After the stent was deployed there was again restoration of complete flow through the segment,  achieving TICI 3: Complete perfusion of the territory Completion TICI: TICI 3 Flat panel CT: No acute intracranial hemorrhage. No midline shift or mass effect after reperfusion. No contrast staining of the affected territory observed. PROCEDURE: The anesthesia team was present to provide general endotracheal tube anesthesia and for patient monitoring during the procedure. Intubation was performed in negative pressure [place] in neuro IR holding. Interventional neuro radiology nursing staff was also present. Ultrasound survey of the right inguinal region was performed with images stored and sent to PACs. 11 blade scalpel was used to make a small incision. Blunt dissection was performed with US guidance. A micropuncture needle was used access the right common femoral artery under ultrasound. With excellent arterial blood flow returned, an .018 micro wire was passed through the needle, observed to enter the abdominal aorta under fluoroscopy. The needle was removed, and a micropuncture sheath was placed over the wire. The inner dilator and wire were removed, and an 035 wire was advanced under fluoroscopy into the abdominal aorta. The sheath was removed and a 25cm 76F straight vascular sheath was placed. The dilator was removed and the sheath was flushed. Sheath was attached to pressurized and heparinized saline bag for constant forward flow. A coaxial system was then advanced over the 035 wire. This included a 80cm Neuron Max with coaxial 100cm JB1 diagnostic catheter. This was advanced to the proximal descending thoracic aorta. Wire was then removed. Double flush of the catheter was performed. Catheter was  then used to select the left subclavian artery. Angiogram was performed. Using roadmap technique, the catheter was advanced over a standard glide wire into left vertebral, with distal position achieved of the diagnostic catheter. The Neuron max catheter was then advanced to the distal vertebral artery, and the diagnostic  catheter and the glide wire were removed. Double flush was performed to clear the guide. Formal angiogram was performed. Road map function was used once the occluded vessel was identified. Copious back flush was performed and the balloon catheter was attached to heparinized and pressurized saline bag for forward flow. A second coaxial system was then advanced through the balloon catheter, which included the selected intermediate catheter, microcatheter, and microwire. In this scenario, the set up included a 137cm Zoom 55 intermediate catheter, a 160cm 021 lumen microcatheter, and 014 synchro soft wire. This system was advanced through the NeuronMax catheter under the road-map function, with adequate back-flush at the rotating hemostatic valve at that back end of the balloon guide. Microcatheter and the intermediate catheter system were advanced to the level of the occlusion. Microcatheter and microwire were then removed, as well as the rotating hemostatic valve on the zoom catheter. The suction engine was then attached to the hub of the Zoom catheter and direct aspiration at the face of the clot was performed. We turned on the vacuum engine, confirming flow, then gently advanced the aspiration catheter into the occlusion. Stasis of flow was observed. We then observed a 3 minute interval. The cather was then pushed through the occlusion to the distal basilar artery, and then the Zoom catheter was withdrawn into the Neuron Max. Aspiration was returning adequate flow at this time. The engine was removed from the hub of the zoom catheter, double flush was performed confirming adequate return of flow, and angiogram was performed. Angiogram demonstrated restoration of flow through the segment, with 50% narrowing in the mid segment of the basilar artery, at the site of the previous occlusion. There was irregular filling defect at this site, most likely representing the site of unstable plaque rupture/intracranial  atherosclerotic disease. At this time we elected to observe the lesion, for potential rethrombosis and need for rescue stenting. During this observation time we attempted placement of orogastric tube. Anesthesia tried on 3 attempts to pass orogastric tube to the stomach, only able to pass the gastric tube to the distal esophagus. We were unable to confirm placement below the diaphragm, and were thus unable to use the gastric tube. This took approximately 10 minutes of working time. Angiogram at the end of this working time then demonstrated reocclusion at the mid basilar artery. Zoom catheter was advanced gently with hand aspiration through the site of occlusion, and then withdrawn, which restored flow. We then prepared for rescue stenting. Parenteral antiplatelets were initiated with a bolus and drip of Kangrelor. Measurements were made of the mid basilar, and we elected to proceed with balloon mounted stenting with 2.5 mm x 8 mm resolute onyx coronary stent. The zoom catheter was removed. Trevo microcatheter and the synchro soft were navigated into the left PCA. Microcatheter was then used to deliver an exchange length transcend floppy tip wire. Once the wire was in position the catheter was removed. We then proceeded with balloon mounted stenting in the mid basilar segment. Ten atmosphere of inflation was observed, corresponding to 2.45 mm diameter inflation. Delivery device was removed and repeat angiogram was performed. All catheters and wires were removed. The skin at the puncture site was then cleaned with  Chlorhexidine. The 8 French sheath was removed and an 82F angioseal was deployed. Flat panel CT was performed. Patient remained intubated given the pretreatment clinical status and basilar occlusion. Patient tolerated the procedure well and remained hemodynamically stable throughout. No complications were encountered and no significant blood loss encountered. IMPRESSION: Status post ultrasound guided access  right common femoral artery, mechanical thrombectomy of mid basilar occlusion at the site of unstable ruptured plaque/ICAD, rescue stenting of mid basilar artery unstable plaque after rethrombosis, achieving TICI 3 flow. Angio-Seal for hemostasis Signed, Yvone Neu. Reyne Dumas, RPVI [facility] Radiology PLAN: The patient will remain intubated, given clinical status before treatment ICU status Target systolic blood pressure of 120-140 Right hip straight time 6 hours Frequent neurovascular checks Repeat neurologic imaging with CT and/MRI at the discretion of neurology team The IV kangrelor will be terminated after a total time of 2 hours per protocol, 6:06 p.m. The patient will receive rectal 600 mg aspirin upon arrival in the ICU Daily aspirin suppositories until a more durable method of p.o. antiplatelets is available, versus patient ability to swallow Electronically Signed   By: Gilmer Mor D.O.   On: 12/04/2020 09:22   IR CT Head Ltd  Result Date: 12/04/2020 INDICATION: 65 year old male presents with acute basilar occlusion for mechanical thrombectomy EXAM: ULTRASOUND-GUIDED ACCESS RIGHT COMMON FEMORAL ARTERY CERVICAL AND CEREBRAL ANGIOGRAM MECHANICAL THROMBECTOMY BASILAR ARTERY RESCUE STENT UNSTABLE PLAQUE BASILAR ARTERY ANGIO-SEAL DEPLOYED FOR HEMOSTASIS COMPARISON:  CT imaging of the same day MEDICATIONS: Intravenous kangrelor bolus and initiation of drip ANESTHESIA/SEDATION: The anesthesia team was present to provide general endotracheal tube anesthesia and for patient monitoring during the procedure. Intubation was performed before arrival to the neuro angio suite. Left radial arterial line was performed by the anesthesia team. Interventional neuro radiology nursing staff was also present. CONTRAST:  200 cc FLUOROSCOPY TIME:  Fluoroscopy Time: 23 minutes 0 seconds (2,527 mGy). COMPLICATIONS: None TECHNIQUE: Emergent consent was implied, with assumed  procedural risks, benefits and alternatives. Specific risks include: Bleeding, infection, contrast reaction, kidney injury/failure, need for further procedure/surgery, arterial injury or dissection, embolization to new territory, intracranial hemorrhage (10-15% risk), neurologic deterioration, cardiopulmonary collapse, death. Maximal Sterile Barrier Technique was utilized including during the procedure including caps, mask, sterile gowns, sterile gloves, sterile drape, hand hygiene and skin antiseptic. A timeout was performed prior to the initiation of the procedure. The anesthesia team was present to provide general endotracheal tube anesthesia and for patient monitoring during the procedure. Interventional neuro radiology nursing staff was also present. FINDINGS: Initial Findings: Left vertebral artery: Normal course caliber and contour. Basilar artery: Short segment critical stenosis within the mid basilar artery, corresponding to findings on the preoperative CT angiogram. Contrast penetrates beyond the occlusion, but with minimal filling of the territory beyond the P1 arteries and superior cerebellar arteries. There is a fairly rounded luminal filling defect at the region of stenosis, with the most likely diagnosis being unstable ruptured atherosclerotic plaque. Initial TICI flow: TICI 1. Mild irregularity in the proximal basilar artery and the right vertebral artery compatible with intracranial atherosclerotic changes. Completion Findings: After the initial aspiration thrombectomy there is complete restoration of flow through the segment, with adequate distal perfusion, compatible with TICI 3: Complete perfusion of the territory. After observation for 5 minutes-10 minutes there is reocclusion at the unstable plaque site. A second aspiration thrombectomy reopened this segment, and then rescue stenting was performed with balloon mounted 2.5 mm x 8 mm on X resolution stent, as described  in the procedural note.  After the stent was deployed there was again restoration of complete flow through the segment, achieving TICI 3: Complete perfusion of the territory Completion TICI: TICI 3 Flat panel CT: No acute intracranial hemorrhage. No midline shift or mass effect after reperfusion. No contrast staining of the affected territory observed. PROCEDURE: The anesthesia team was present to provide general endotracheal tube anesthesia and for patient monitoring during the procedure. Intubation was performed in negative pressure [place] in neuro IR holding. Interventional neuro radiology nursing staff was also present. Ultrasound survey of the right inguinal region was performed with images stored and sent to PACs. 11 blade scalpel was used to make a small incision. Blunt dissection was performed with US guidance. A micropuncture needle was used access the right common femoral artery under ultrasound. With excellent arterial blood flow returned, an .018 micro wire was passed through the needle, observed to enter the abdominal aorta under fluoroscopy. The needle was removed, and a micropuncture sheath was placed over the wire. The inner dilator and wire were removed, and an 035 wire was advanced under fluoroscopy into the abdominal aorta. The sheath was removed and a 25cm 50F straight vascular sheath was placed. The dilator was removed and the sheath was flushed. Sheath was attached to pressurized and heparinized saline bag for constant forward flow. A coaxial system was then advanced over the 035 wire. This included a 80cm Neuron Max with coaxial 100cm JB1 diagnostic catheter. This was advanced to the proximal descending thoracic aorta. Wire was then removed. Double flush of the catheter was performed. Catheter was then used to select the left subclavian artery. Angiogram was performed. Using roadmap technique, the catheter was advanced over a standard glide wire into left vertebral, with distal position achieved of the diagnostic  catheter. The Neuron max catheter was then advanced to the distal vertebral artery, and the diagnostic catheter and the glide wire were removed. Double flush was performed to clear the guide. Formal angiogram was performed. Road map function was used once the occluded vessel was identified. Copious back flush was performed and the balloon catheter was attached to heparinized and pressurized saline bag for forward flow. A second coaxial system was then advanced through the balloon catheter, which included the selected intermediate catheter, microcatheter, and microwire. In this scenario, the set up included a 137cm Zoom 55 intermediate catheter, a 160cm 021 lumen microcatheter, and 014 synchro soft wire. This system was advanced through the NeuronMax catheter under the road-map function, with adequate back-flush at the rotating hemostatic valve at that back end of the balloon guide. Microcatheter and the intermediate catheter system were advanced to the level of the occlusion. Microcatheter and microwire were then removed, as well as the rotating hemostatic valve on the zoom catheter. The suction engine was then attached to the hub of the Zoom catheter and direct aspiration at the face of the clot was performed. We turned on the vacuum engine, confirming flow, then gently advanced the aspiration catheter into the occlusion. Stasis of flow was observed. We then observed a 3 minute interval. The cather was then pushed through the occlusion to the distal basilar artery, and then the Zoom catheter was withdrawn into the Neuron Max. Aspiration was returning adequate flow at this time. The engine was removed from the hub of the zoom catheter, double flush was performed confirming adequate return of flow, and angiogram was performed. Angiogram demonstrated restoration of flow through the segment, with 50% narrowing in the mid segment of  the basilar artery, at the site of the previous occlusion. There was irregular filling  defect at this site, most likely representing the site of unstable plaque rupture/intracranial atherosclerotic disease. At this time we elected to observe the lesion, for potential rethrombosis and need for rescue stenting. During this observation time we attempted placement of orogastric tube. Anesthesia tried on 3 attempts to pass orogastric tube to the stomach, only able to pass the gastric tube to the distal esophagus. We were unable to confirm placement below the diaphragm, and were thus unable to use the gastric tube. This took approximately 10 minutes of working time. Angiogram at the end of this working time then demonstrated reocclusion at the mid basilar artery. Zoom catheter was advanced gently with hand aspiration through the site of occlusion, and then withdrawn, which restored flow. We then prepared for rescue stenting. Parenteral antiplatelets were initiated with a bolus and drip of Kangrelor. Measurements were made of the mid basilar, and we elected to proceed with balloon mounted stenting with 2.5 mm x 8 mm resolute onyx coronary stent. The zoom catheter was removed. Trevo microcatheter and the synchro soft were navigated into the left PCA. Microcatheter was then used to deliver an exchange length transcend floppy tip wire. Once the wire was in position the catheter was removed. We then proceeded with balloon mounted stenting in the mid basilar segment. Ten atmosphere of inflation was observed, corresponding to 2.45 mm diameter inflation. Delivery device was removed and repeat angiogram was performed. All catheters and wires were removed. The skin at the puncture site was then cleaned with Chlorhexidine. The 8 French sheath was removed and an 32F angioseal was deployed. Flat panel CT was performed. Patient remained intubated given the pretreatment clinical status and basilar occlusion. Patient tolerated the procedure well and remained hemodynamically stable throughout. No complications were  encountered and no significant blood loss encountered. 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 Angio-Seal for hemostasis Signed, Yvone Neu. Reyne Dumas, RPVI [facility] Radiology PLAN: The patient will remain intubated, given clinical status before treatment ICU status Target systolic blood pressure of 120-140 Right hip straight time 6 hours Frequent neurovascular checks Repeat neurologic imaging with CT and/MRI at the discretion of neurology team The IV kangrelor will be terminated after a total time of 2 hours per protocol, 6:06 p.m. The patient will receive rectal 600 mg aspirin upon arrival in the ICU Daily aspirin suppositories until a more durable method of p.o. antiplatelets is available, versus patient ability to swallow Electronically Signed   By: Gilmer Mor D.O.   On: 12/04/2020 09:22   IR US Guide Vasc Access Right  Result Date: 12/04/2020 INDICATION: 65 year old male presents with acute basilar occlusion for mechanical thrombectomy EXAM: ULTRASOUND-GUIDED ACCESS RIGHT COMMON FEMORAL ARTERY CERVICAL AND CEREBRAL ANGIOGRAM MECHANICAL THROMBECTOMY BASILAR ARTERY RESCUE STENT UNSTABLE PLAQUE BASILAR ARTERY ANGIO-SEAL DEPLOYED FOR HEMOSTASIS COMPARISON:  CT imaging of the same day MEDICATIONS: Intravenous kangrelor bolus and initiation of drip ANESTHESIA/SEDATION: The anesthesia team was present to provide general endotracheal tube anesthesia and for patient monitoring during the procedure. Intubation was performed before arrival to the neuro angio suite. Left radial arterial line was performed by the anesthesia team. Interventional neuro radiology nursing staff was also present. CONTRAST:  200 cc FLUOROSCOPY TIME:  Fluoroscopy  Time: 23  minutes 0 seconds (2,527 mGy). COMPLICATIONS: None TECHNIQUE: Emergent consent was implied, with assumed procedural risks, benefits and alternatives. Specific risks include: Bleeding, infection, contrast reaction, kidney injury/failure, need for further procedure/surgery, arterial injury or dissection, embolization to new territory, intracranial hemorrhage (10-15% risk), neurologic deterioration, cardiopulmonary collapse, death. Maximal Sterile Barrier Technique was utilized including during the procedure including caps, mask, sterile gowns, sterile gloves, sterile drape, hand hygiene and skin antiseptic. A timeout was performed prior to the initiation of the procedure. The anesthesia team was present to provide general endotracheal tube anesthesia and for patient monitoring during the procedure. Interventional neuro radiology nursing staff was also present. FINDINGS: Initial Findings: Left vertebral artery: Normal course caliber and contour. Basilar artery: Short segment critical stenosis within the mid basilar artery, corresponding to findings on the preoperative CT angiogram. Contrast penetrates beyond the occlusion, but with minimal filling of the territory beyond the P1 arteries and superior cerebellar arteries. There is a fairly rounded luminal filling defect at the region of stenosis, with the most likely diagnosis being unstable ruptured atherosclerotic plaque. Initial TICI flow: TICI 1. Mild irregularity in the proximal basilar artery and the right vertebral artery compatible with intracranial atherosclerotic changes. Completion Findings: After the initial aspiration thrombectomy there is complete restoration of flow through the segment, with adequate distal perfusion, compatible with TICI 3: Complete perfusion of the territory. After observation for 5 minutes-10 minutes there is reocclusion at the unstable plaque site. A second aspiration thrombectomy reopened this segment, and then rescue  stenting was performed with balloon mounted 2.5 mm x 8 mm on X resolution stent, as described in the procedural note. After the stent was deployed there was again restoration of complete flow through the segment, achieving TICI 3: Complete perfusion of the territory Completion TICI: TICI 3 Flat panel CT: No acute intracranial hemorrhage. No midline shift or mass effect after reperfusion. No contrast staining of the affected territory observed. PROCEDURE: The anesthesia team was present to provide general endotracheal tube anesthesia and for patient monitoring during the procedure. Intubation was performed in negative pressure [place] in neuro IR holding. Interventional neuro radiology nursing staff was also present. Ultrasound survey of the right inguinal region was performed with images stored and sent to PACs. 11 blade scalpel was used to make a small incision. Blunt dissection was performed with US guidance. A micropuncture needle was used access the right common femoral artery under ultrasound. With excellent arterial blood flow returned, an .018 micro wire was passed through the needle, observed to enter the abdominal aorta under fluoroscopy. The needle was removed, and a micropuncture sheath was placed over the wire. The inner dilator and wire were removed, and an 035 wire was advanced under fluoroscopy into the abdominal aorta. The sheath was removed and a 25cm 54F straight vascular sheath was placed. The dilator was removed and the sheath was flushed. Sheath was attached to pressurized and heparinized saline bag for constant forward flow. A coaxial system was then advanced over the 035 wire. This included a 80cm Neuron Max with coaxial 100cm JB1 diagnostic catheter. This was advanced to the proximal descending thoracic aorta. Wire was then removed. Double flush of the catheter was performed. Catheter was then used to select the left subclavian artery. Angiogram was performed. Using roadmap technique, the catheter  was advanced over a standard glide wire into left vertebral, with distal position achieved of the diagnostic catheter. The Neuron max catheter was then advanced to the distal vertebral artery, and the diagnostic catheter and  the glide wire were removed. Double flush was performed to clear the guide. Formal angiogram was performed. Road map function was used once the occluded vessel was identified. Copious back flush was performed and the balloon catheter was attached to heparinized and pressurized saline bag for forward flow. A second coaxial system was then advanced through the balloon catheter, which included the selected intermediate catheter, microcatheter, and microwire. In this scenario, the set up included a 137cm Zoom 55 intermediate catheter, a 160cm 021 lumen microcatheter, and 014 synchro soft wire. This system was advanced through the NeuronMax catheter under the road-map function, with adequate back-flush at the rotating hemostatic valve at that back end of the balloon guide. Microcatheter and the intermediate catheter system were advanced to the level of the occlusion. Microcatheter and microwire were then removed, as well as the rotating hemostatic valve on the zoom catheter. The suction engine was then attached to the hub of the Zoom catheter and direct aspiration at the face of the clot was performed. We turned on the vacuum engine, confirming flow, then gently advanced the aspiration catheter into the occlusion. Stasis of flow was observed. We then observed a 3 minute interval. The cather was then pushed through the occlusion to the distal basilar artery, and then the Zoom catheter was withdrawn into the Neuron Max. Aspiration was returning adequate flow at this time. The engine was removed from the hub of the zoom catheter, double flush was performed confirming adequate return of flow, and angiogram was performed. Angiogram demonstrated restoration of flow through the segment, with 50% narrowing  in the mid segment of the basilar artery, at the site of the previous occlusion. There was irregular filling defect at this site, most likely representing the site of unstable plaque rupture/intracranial atherosclerotic disease. At this time we elected to observe the lesion, for potential rethrombosis and need for rescue stenting. During this observation time we attempted placement of orogastric tube. Anesthesia tried on 3 attempts to pass orogastric tube to the stomach, only able to pass the gastric tube to the distal esophagus. We were unable to confirm placement below the diaphragm, and were thus unable to use the gastric tube. This took approximately 10 minutes of working time. Angiogram at the end of this working time then demonstrated reocclusion at the mid basilar artery. Zoom catheter was advanced gently with hand aspiration through the site of occlusion, and then withdrawn, which restored flow. We then prepared for rescue stenting. Parenteral antiplatelets were initiated with a bolus and drip of Kangrelor. Measurements were made of the mid basilar, and we elected to proceed with balloon mounted stenting with 2.5 mm x 8 mm resolute onyx coronary stent. The zoom catheter was removed. Trevo microcatheter and the synchro soft were navigated into the left PCA. Microcatheter was then used to deliver an exchange length transcend floppy tip wire. Once the wire was in position the catheter was removed. We then proceeded with balloon mounted stenting in the mid basilar segment. Ten atmosphere of inflation was observed, corresponding to 2.45 mm diameter inflation. Delivery device was removed and repeat angiogram was performed. All catheters and wires were removed. The skin at the puncture site was then cleaned with Chlorhexidine. The 8 French sheath was removed and an 46F angioseal was deployed. Flat panel CT was performed. Patient remained intubated given the pretreatment clinical status and basilar occlusion. Patient  tolerated the procedure well and remained hemodynamically stable throughout. No complications were encountered and no significant blood loss encountered. 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 Angio-Seal for hemostasis Signed, Yvone Neu. Reyne Dumas, RPVI [facility] Radiology PLAN: The patient will remain intubated, given clinical status before treatment ICU status Target systolic blood pressure of 120-140 Right hip straight time 6 hours Frequent neurovascular checks Repeat neurologic imaging with CT and/MRI at the discretion of neurology team The IV kangrelor will be terminated after a total time of 2 hours per protocol, 6:06 p.m. The patient will receive rectal 600 mg aspirin upon arrival in the ICU Daily aspirin suppositories until a more durable method of p.o. antiplatelets is available, versus patient ability to swallow Electronically Signed   By: Gilmer Mor D.O.   On: 12/04/2020 09:22   DG Chest Port 1 View  Result Date: 12/03/2020 CLINICAL DATA:  Acute respiratory failure. Endotracheally intubated. EXAM: PORTABLE CHEST 1 VIEW COMPARISON:  None. FINDINGS: Endotracheal tube is seen in place, with distal tip approximately 4.5 cm above the carina. Low lung volumes are noted. Right upper lobe collapse is seen as well as atelectasis or infiltrate in the left lower lobe. No evidence of pneumothorax. IMPRESSION: Endotracheal tube in appropriate position. Right upper lobe collapse, and left lower lobe atelectasis versus infiltrate. Electronically Signed   By: Danae Orleans M.D.   On: 12/03/2020 18:37   DG Abd Portable 1V  Result Date: 12/04/2020 CLINICAL DATA:  Feeding tube placement EXAM: PORTABLE ABDOMEN - 1 VIEW COMPARISON:  None.  FINDINGS: Feeding tube tip in the mid to distal stomach. Nonobstructive bowel gas pattern. IMPRESSION: Feeding tube tip in the mid to distal stomach. Electronically Signed   By: Charlett Nose M.D.   On: 12/04/2020 10:58   ECHOCARDIOGRAM COMPLETE  Result Date: 12/05/2020    ECHOCARDIOGRAM REPORT   Patient Name:   ATHARVA MIRSKY Date of Exam: 12/05/2020 Medical Rec #:  [id number]    Height:       71.0 in Accession #:    [id number]   Weight:       220.0 lb Date of Birth:  04-06-1955    BSA:          2.196 m Patient Age:    65 years     BP:           138/86 mmHg Patient Gender: M            HR:           84 bpm. Exam Location:  Inpatient Procedure: 2D Echo, Color Doppler and Cardiac Doppler Indications:    Stroke I63.9  History:        Patient has no prior history of Echocardiogram examinations.                 Ischemic Stroke, only has PMH known of HTN.  Sonographer:    Jeryl Columbia RDCS Referring Phys: [id number] Reyne Dumas [facility] IMPRESSIONS  1. Left ventricular ejection fraction, by estimation, is 70 to 75%. The left ventricle has hyperdynamic function. The left ventricle has no regional wall motion abnormalities. Left ventricular diastolic parameters were normal.  2. Right ventricular systolic function is normal. The right ventricular size is normal. Tricuspid regurgitation signal is inadequate for assessing PA pressure.  3. The mitral valve is grossly normal. Trivial mitral valve regurgitation. No evidence of mitral stenosis.  4. The aortic valve is  tricuspid. Aortic valve regurgitation is not visualized. No aortic stenosis is present.  5. The inferior vena cava is normal in size with greater than 50% respiratory variability, suggesting right atrial pressure of 3 mmHg. Conclusion(s)/Recommendation(s): No intracardiac source of embolism detected on this transthoracic study. A transesophageal echocardiogram is recommended to exclude cardiac source of embolism if clinically indicated. FINDINGS  Left Ventricle: Left  ventricular ejection fraction, by estimation, is 70 to 75%. The left ventricle has hyperdynamic function. The left ventricle has no regional wall motion abnormalities. The left ventricular internal cavity size was normal in size. There is no left ventricular hypertrophy. Left ventricular diastolic parameters were normal. Right Ventricle: The right ventricular size is normal. No increase in right ventricular wall thickness. Right ventricular systolic function is normal. Tricuspid regurgitation signal is inadequate for assessing PA pressure. Left Atrium: Left atrial size was normal in size. Right Atrium: Right atrial size was normal in size. Pericardium: Trivial pericardial effusion is present. Presence of pericardial fat pad. Mitral Valve: The mitral valve is grossly normal. Trivial mitral valve regurgitation. No evidence of mitral valve stenosis. Tricuspid Valve: The tricuspid valve is grossly normal. Tricuspid valve regurgitation is trivial. No evidence of tricuspid stenosis. Aortic Valve: The aortic valve is tricuspid. Aortic valve regurgitation is not visualized. No aortic stenosis is present. Pulmonic Valve: The pulmonic valve was grossly normal. Pulmonic valve regurgitation is not visualized. No evidence of pulmonic stenosis. Aorta: The aortic root and ascending aorta are structurally normal, with no evidence of dilitation. Venous: The inferior vena cava is normal in size with greater than 50% respiratory variability, suggesting right atrial pressure of 3 mmHg. IAS/Shunts: The atrial septum is grossly normal.  LEFT VENTRICLE PLAX 2D LVIDd:         4.80 cm  Diastology LVIDs:         3.40 cm  LV e' medial:    7.18 cm/s LV PW:         1.00 cm  LV E/e' medial:  14.2 LV IVS:        1.10 cm  LV e' lateral:   9.03 cm/s LVOT diam:     1.90 cm  LV E/e' lateral: 11.3 LV SV:         78 LV SV Index:   36 LVOT Area:     2.84 cm  RIGHT VENTRICLE RV S prime:     17.60 cm/s TAPSE (M-mode): 2.6 cm LEFT ATRIUM             Index        RIGHT ATRIUM          Index LA diam:        3.20 cm 1.46 cm/m  RA Area:     8.35 cm LA Vol (A2C):   61.5 ml 28.01 ml/m RA Volume:   13.10 ml 5.97 ml/m LA Vol (A4C):   37.2 ml 16.94 ml/m LA Biplane Vol: 47.5 ml 21.63 ml/m  AORTIC VALVE LVOT Vmax:   163.00 cm/s LVOT Vmean:  107.000 cm/s LVOT VTI:    0.275 m  AORTA Ao Root diam: 2.60 cm MITRAL VALVE MV Area (PHT): 4.10 cm     SHUNTS MV Decel Time: 185 msec     Systemic VTI:  0.28 m MV E velocity: 102.00 cm/s  Systemic Diam: 1.90 cm MV A velocity: 127.00 cm/s MV E/A ratio:  0.80 Lennie Odor MD Electronically signed by Lennie Odor MD Signature Date/Time: 12/05/2020/12:56:22 PM    Final  IR PERCUTANEOUS ART THROMBECTOMY/INFUSION INTRACRANIAL INC DIAG ANGIO  Result Date: 12/04/2020 INDICATION: 65 year old male presents with acute basilar occlusion for mechanical thrombectomy EXAM: ULTRASOUND-GUIDED ACCESS RIGHT COMMON FEMORAL ARTERY CERVICAL AND CEREBRAL ANGIOGRAM MECHANICAL THROMBECTOMY BASILAR ARTERY RESCUE STENT UNSTABLE PLAQUE BASILAR ARTERY ANGIO-SEAL DEPLOYED FOR HEMOSTASIS COMPARISON:  CT imaging of the same day MEDICATIONS: Intravenous kangrelor bolus and initiation of drip ANESTHESIA/SEDATION: The anesthesia team was present to provide general endotracheal tube anesthesia and for patient monitoring during the procedure. Intubation was performed before arrival to the neuro angio suite. Left radial arterial line was performed by the anesthesia team. Interventional neuro radiology nursing staff was also present. CONTRAST:  200 cc FLUOROSCOPY TIME:  Fluoroscopy Time: 23 minutes 0 seconds (2,527 mGy). COMPLICATIONS: None TECHNIQUE: Emergent consent was implied, with assumed procedural risks, benefits and alternatives. Specific risks include: Bleeding, infection, contrast reaction, kidney injury/failure, need for further procedure/surgery, arterial injury or dissection, embolization to new territory, intracranial hemorrhage (10-15% risk),  neurologic deterioration, cardiopulmonary collapse, death. Maximal Sterile Barrier Technique was utilized including during the procedure including caps, mask, sterile gowns, sterile gloves, sterile drape, hand hygiene and skin antiseptic. A timeout was performed prior to the initiation of the procedure. The anesthesia team was present to provide general endotracheal tube anesthesia and for patient monitoring during the procedure. Interventional neuro radiology nursing staff was also present. FINDINGS: Initial Findings: Left vertebral artery: Normal course caliber and contour. Basilar artery: Short segment critical stenosis within the mid basilar artery, corresponding to findings on the preoperative CT angiogram. Contrast penetrates beyond the occlusion, but with minimal filling of the territory beyond the P1 arteries and superior cerebellar arteries. There is a fairly rounded luminal filling defect at the region of stenosis, with the most likely diagnosis being unstable ruptured atherosclerotic plaque. Initial TICI flow: TICI 1. Mild irregularity in the proximal basilar artery and the right vertebral artery compatible with intracranial atherosclerotic changes. Completion Findings: After the initial aspiration thrombectomy there is complete restoration of flow through the segment, with adequate distal perfusion, compatible with TICI 3: Complete perfusion of the territory. After observation for 5 minutes-10 minutes there is reocclusion at the unstable plaque site. A second aspiration thrombectomy reopened this segment, and then rescue stenting was performed with balloon mounted 2.5 mm x 8 mm on X resolution stent, as described in the procedural note. After the stent was deployed there was again restoration of complete flow through the segment, achieving TICI 3: Complete perfusion of the territory Completion TICI: TICI 3 Flat panel CT: No acute intracranial hemorrhage. No midline shift or mass effect after reperfusion.  No contrast staining of the affected territory observed. PROCEDURE: The anesthesia team was present to provide general endotracheal tube anesthesia and for patient monitoring during the procedure. Intubation was performed in negative pressure [place] in neuro IR holding. Interventional neuro radiology nursing staff was also present. Ultrasound survey of the right inguinal region was performed with images stored and sent to PACs. 11 blade scalpel was used to make a small incision. Blunt dissection was performed with US guidance. A micropuncture needle was used access the right common femoral artery under ultrasound. With excellent arterial blood flow returned, an .018 micro wire was passed through the needle, observed to enter the abdominal aorta under fluoroscopy. The needle was removed, and a micropuncture sheath was placed over the wire. The inner dilator and wire were removed, and an 035 wire was advanced under fluoroscopy into the abdominal aorta. The sheath was removed and a 25cm 64F straight  vascular sheath was placed. The dilator was removed and the sheath was flushed. Sheath was attached to pressurized and heparinized saline bag for constant forward flow. A coaxial system was then advanced over the 035 wire. This included a 80cm Neuron Max with coaxial 100cm JB1 diagnostic catheter. This was advanced to the proximal descending thoracic aorta. Wire was then removed. Double flush of the catheter was performed. Catheter was then used to select the left subclavian artery. Angiogram was performed. Using roadmap technique, the catheter was advanced over a standard glide wire into left vertebral, with distal position achieved of the diagnostic catheter. The Neuron max catheter was then advanced to the distal vertebral artery, and the diagnostic catheter and the glide wire were removed. Double flush was performed to clear the guide. Formal angiogram was performed. Road map function was used once the occluded vessel was  identified. Copious back flush was performed and the balloon catheter was attached to heparinized and pressurized saline bag for forward flow. A second coaxial system was then advanced through the balloon catheter, which included the selected intermediate catheter, microcatheter, and microwire. In this scenario, the set up included a 137cm Zoom 55 intermediate catheter, a 160cm 021 lumen microcatheter, and 014 synchro soft wire. This system was advanced through the NeuronMax catheter under the road-map function, with adequate back-flush at the rotating hemostatic valve at that back end of the balloon guide. Microcatheter and the intermediate catheter system were advanced to the level of the occlusion. Microcatheter and microwire were then removed, as well as the rotating hemostatic valve on the zoom catheter. The suction engine was then attached to the hub of the Zoom catheter and direct aspiration at the face of the clot was performed. We turned on the vacuum engine, confirming flow, then gently advanced the aspiration catheter into the occlusion. Stasis of flow was observed. We then observed a 3 minute interval. The cather was then pushed through the occlusion to the distal basilar artery, and then the Zoom catheter was withdrawn into the Neuron Max. Aspiration was returning adequate flow at this time. The engine was removed from the hub of the zoom catheter, double flush was performed confirming adequate return of flow, and angiogram was performed. Angiogram demonstrated restoration of flow through the segment, with 50% narrowing in the mid segment of the basilar artery, at the site of the previous occlusion. There was irregular filling defect at this site, most likely representing the site of unstable plaque rupture/intracranial atherosclerotic disease. At this time we elected to observe the lesion, for potential rethrombosis and need for rescue stenting. During this observation time we attempted placement of  orogastric tube. Anesthesia tried on 3 attempts to pass orogastric tube to the stomach, only able to pass the gastric tube to the distal esophagus. We were unable to confirm placement below the diaphragm, and were thus unable to use the gastric tube. This took approximately 10 minutes of working time. Angiogram at the end of this working time then demonstrated reocclusion at the mid basilar artery. Zoom catheter was advanced gently with hand aspiration through the site of occlusion, and then withdrawn, which restored flow. We then prepared for rescue stenting. Parenteral antiplatelets were initiated with a bolus and drip of Kangrelor. Measurements were made of the mid basilar, and we elected to proceed with balloon mounted stenting with 2.5 mm x 8 mm resolute onyx coronary stent. The zoom catheter was removed. Trevo microcatheter and the synchro soft were navigated into the left PCA. Microcatheter was  then used to deliver an exchange length transcend floppy tip wire. Once the wire was in position the catheter was removed. We then proceeded with balloon mounted stenting in the mid basilar segment. Ten atmosphere of inflation was observed, corresponding to 2.45 mm diameter inflation. Delivery device was removed and repeat angiogram was performed. All catheters and wires were removed. The skin at the puncture site was then cleaned with Chlorhexidine. The 8 French sheath was removed and an 48F angioseal was deployed. Flat panel CT was performed. Patient remained intubated given the pretreatment clinical status and basilar occlusion. Patient tolerated the procedure well and remained hemodynamically stable throughout. No complications were encountered and no significant blood loss encountered. 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 Angio-Seal for hemostasis Signed, Yvone Neu. Reyne Dumas, RPVI [facility] Radiology PLAN: The patient will remain intubated, given clinical status before treatment ICU status Target systolic blood pressure of 120-140 Right hip straight time 6 hours Frequent neurovascular checks Repeat neurologic imaging with CT and/MRI at the discretion of neurology team The IV kangrelor will be terminated after a total time of 2 hours per protocol, 6:06 p.m. The patient will receive rectal 600 mg aspirin upon arrival in the ICU Daily aspirin suppositories until a more durable method of p.o. antiplatelets is available, versus patient ability to swallow Electronically Signed   By: Gilmer Mor D.O.   On: 12/04/2020 09:22   CT HEAD CODE STROKE WO CONTRAST  Addendum Date: 12/03/2020   ADDENDUM REPORT: 12/03/2020 14:11 ADDENDUM: Suspected small age-indeterminate infarct within the right cerebellar hemisphere (series 6, image 18) (series 5, images 50 and 51). These results were called by telephone at the time of interpretation on 12/03/2020 at 1:42 PM to provider Dr. Selina Cooley, who verbally acknowledged these results. Electronically Signed   By: Jackey Loge D.O.   On: 12/03/2020 14:11   Result Date: 12/03/2020 CLINICAL DATA:  Code stroke. Neuro deficit, acute, stroke suspected. Altered mental status. Last known well 10:30 a.m. EXAM: CT HEAD WITHOUT CONTRAST TECHNIQUE: Contiguous axial images were obtained from the base of the skull through the vertex without intravenous contrast. COMPARISON:  None FINDINGS: Brain: Mild generalized cerebral and cerebellar atrophy. Mild patchy and ill-defined hypoattenuation within the cerebral white matter, nonspecific but compatible with chronic small vessel ischemic disease. Age-indeterminate lacunar infarct within the central pons (series 3, image 10) (series 5, image 39) (series 6, image 28). There is no acute  intracranial hemorrhage. No acute demarcated cortical infarct. No extra-axial fluid collection. No evidence of an intracranial mass. No midline shift. Vascular: No hyperdense vessel.  Atherosclerotic calcifications. Skull: Normal. Negative for fracture or focal lesion. Sinuses/Orbits: Visualized orbits show no acute finding. Trace scattered paranasal sinus mucosal thickening at the imaged levels. ASPECTS (Alberta Stroke Program Early CT Score) - Ganglionic level infarction (caudate, lentiform nuclei, internal capsule, insula, M1-M3 cortex): 7 - Supraganglionic infarction (M4-M6 cortex): 3 Total score (0-10 with 10 being normal): 10 These results were called by telephone at the time of interpretation on 12/03/2020 at 1:19 pm to provider Dr. Selina Cooley, who verbally acknowledged these results. IMPRESSION: No evidence of acute intracranial hemorrhage or acute demarcated cortical infarction. Age-indeterminate lacunar infarct within the central pons. Mild chronic small-vessel ischemic changes within the cerebral white matter. Mild generalized parenchymal atrophy. Electronically Signed: By: Ronaldo Miyamoto  Renette Butters D.O. On: 12/03/2020 13:26   CT ANGIO HEAD NECK W WO CM (CODE STROKE)  Result Date: 12/03/2020 CLINICAL DATA:  Neuro deficit, acute, stroke suspected. Altered mental status. EXAM: CT ANGIOGRAPHY HEAD AND NECK TECHNIQUE: Multidetector CT imaging of the head and neck was performed using the standard protocol during bolus administration of intravenous contrast. Multiplanar CT image reconstructions and MIPs were obtained to evaluate the vascular anatomy. Carotid stenosis measurements (when applicable) are obtained utilizing NASCET criteria, using the distal internal carotid diameter as the denominator. CONTRAST:  75mL OMNIPAQUE IOHEXOL 350 MG/ML SOLN COMPARISON:  Noncontrast head CT performed earlier today 12/03/2020. FINDINGS: CTA NECK FINDINGS Aortic arch: Standard aortic branching. Atherosclerotic plaque within the visualized  aortic arch and proximal major branch vessels of the neck. Streak and beam hardening artifact arising from a dense right-sided contrast bolus partially obscures the right subclavian artery. Within this limitation, there is no hemodynamically significant innominate or proximal subclavian artery stenosis. Right carotid system: CCA and ICA patent within the neck. Soft and calcified plaque within the carotid bifurcation and proximal ICA. Apparent high-grade (greater than 70% stenosis within the proximal ICA (series 6, image 173). However, beam hardening artifact and vessel tortuosity limit evaluation of the vessel lumen at this site, and it is suspected that artifact may significantly accentuate this apparent stenosis. Left carotid system: CCA and ICA patent within the neck. Soft and calcified plaque within the carotid bifurcation and proximal ICA. Calcified plaque results in less than 50% stenosis within the proximal ICA. Vertebral arteries: The right vertebral artery is non dominant. There is severe atherosclerotic stenosis at the origin of the right vertebral artery. The right vertebral artery appears occluded at the distal V2 segment level and at the level of the V2/V3 junction. There is reconstitution of this vessel intracranially, possibly due to retrograde flow. The dominant left vertebral artery is patent throughout the neck without stenosis. Skeleton: No acute bony abnormality or aggressive osseous lesion. Cervical spondylosis, advanced at C6-C7. Cervical levocurvature. Cervicothoracic dextrocurvature more inferiorly. Other neck: No neck mass or cervical lymphadenopathy. Upper chest: No consolidation within the imaged lung apices. Mild fluid distention of the mid to distal esophagus. Review of the MIP images confirms the above findings CTA HEAD FINDINGS Anterior circulation: The intracranial internal carotid arteries are patent. Calcified plaque within both vessels. Moderate stenosis within the cavernous  internal carotid arteries bilaterally. The M1 middle cerebral arteries are patent. Atherosclerotic irregularity of the M2 and more distal middle cerebral artery vessels bilaterally. No M2 proximal branch occlusion is identified. The anterior cerebral arteries are patent. No intracranial aneurysm is identified. Posterior circulation: Reconstitution of the V4 right vertebral artery intracranially, which may be due to retrograde flow. The dominant left vertebral artery is patent intracranially without stenosis. The basilar artery is patent proximally. However, there is a focal occlusion of the mid basilar artery measuring 7 mm in length. There is an apparent additional focal occlusion within the distal basilar artery. Enhancement is present within the basilar tip. The posterior cerebral arteries are patent. Both PCAs demonstrated multifocal atherosclerotic irregularity. Most notably, there is a severe stenosis at the origin of the left posterior cerebral artery, and a severe stenosis within the P2 right posterior cerebral artery. Posterior communicating arteries are hypoplastic or absent bilaterally. Venous sinuses: Within the limitations of contrast timing, no convincing thrombus. Anatomic variants: As described Review of the MIP images confirms the above findings Findings of right vertebral artery occlusion and multifocal basilar artery occlusion called by telephone at  the time of interpretation on 12/03/2020 at 1:42 pm to provider [facility] , who verbally acknowledged these results. IMPRESSION: CTA neck: 1. Severe atherosclerotic stenosis at the origin of the non-dominant right vertebral artery. The vertebral artery appears occluded at the distal V2 segment level, and at the level of the V2/V3 junction. There is reconstitution of the right vertebral artery more distally, possibly due to retrograde flow. 2. The left vertebral artery is dominant and patent throughout the neck without stenosis. 3. The common carotid  and internal carotid arteries are patent within the neck. There is apparent high-grade (greater than 70%) focal stenosis of the proximal right ICA. However, beam hardening artifact and vessel tortuosity significantly limit evaluation of the vessel lumen at this site, and it is suspected that artifact may significantly accentuate this apparent stenosis. A carotid artery duplex examination is recommended for further assessment. Atherosclerotic plaque within the left carotid bifurcation and proximal ICA results in less than 50% stenosis within the proximal left ICA. 4. Mild fluid distention of the visualized mid-to-distal esophagus. Aspiration precautions advised. CTA head: 1. 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 Enhancement is present at the basilar tip. 2. Enhancement is present within the V4 right vertebral artery, which may be due to retrograde flow (given occlusion of this vessel more proximally). 3. 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 4. Severe stenosis at the origin of the left posterior cerebral artery. 5. Severe stenosis within the P2 right posterior cerebral artery. 6. Moderate stenosis within the cavernous internal carotid arteries, bilaterally. Electronically Signed   By: Jackey Loge D.O.   On: 12/03/2020 14:10      HISTORY OF PRESENT ILLNESS Adelfo Diebel is a 65 y.o. male who only has PMH known of HTN.  He presented to [facility] 9/15 with R hemiplegia and slurred speech. NIHSS 16. Last known well at 1030 and found on floor at 1230CT head was negative. 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 He was given TNKase and then required intubation. 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 IR team, Dr. Loreta Ave, emergently consulted and patient was emergently taken to IR.    HOSPITAL COURSE Patient was admitted post IR to the neurologic ICU. He was extubated successfully  with and made good neurologic and respiratory improvement. Plan:  Stroke - posterior scattered infarct due to BA and R VA occlusion s/p  thrombectomy with TICI3 and BA stent placement, etiology likely due to large vessel disease. CT No evidence of acute intracranial hemorrhage or acute demarcated cortical infarction. Age-indeterminate lacunar infarct within the central pons. CTA head & neck 1. 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 Enhancement is present at the basilar tip. 2. Enhancement is present within the R V4, which may be due to retrograde flow (given occlusion of this vessel more proximally). 3. 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 4. Severe stenosis at the origin of the left PCA. 5. Severe stenosis within the P2 right PCA. S/p IR with TICI3 reperfusion and basilar artery stent placement MRI scattered bilateral punctate cerebellar, brainstem and occipital lobe infarcts.  No hemorrhage  2D Echo EF 70 to 75% LDL 136 HgbA1c 5.7 VTE prophylaxis -SCDs No antiplatelet or anticoagulant prior to admission, currently on aspirin 81 and Brilinta 90 twice daily x 3 months  then ASA alone Social worker consulted to assist with ensuring Brilinta plan works out financially for patient.  Therapy recommendations: None Disposition:  Pending   Hypertension Home meds: Chlorthalidone 25mg  discontinued  Off Cardene On lisinopril, norvasc  Avoid low BP Long-term BP goal 130-150 given multifocal severe intracranial stenosis   Respiratory Failure Aspiration PNA Extubated tolerating well Management per CCM appreciated  Unasyn transitioned to Augmentin PO per pharmacy and CCM Advised to finish out antibiotic regimen at home    Hyperlipidemia Home meds:  None LDL pending On Lipitor 40 Continue statin at discharge       Dysphagia Cortrak placed -> TF -> now passed swallow -> on diet -> cortrak d/c. Tolerating diet very  well.  Speech on board     Other Stroke Risk Factors Advanced Age >/= 7  Former Cigarette smoker Substance abuse - UDS:  THC POSITIVE. Patient advised to stop using due to stroke risk. Obesity, Body mass index is 30.69 kg/m., BMI >/= 30 associated with increased stroke risk, will recommend weight loss, diet and exercise as appropriate   DISCHARGE EXAM Blood pressure (!) 159/77, pulse (!) 105, temperature 99.2 F (37.3 C), temperature source Oral, resp. rate 16, height 5\' 11"  (1.803 m), SpO2 97 %.  General - Well nourished, well developed, in no apparent distress.Sitting up in chair at bedside.    Ophthalmologic - fundi not visualized due to noncooperation.   Cardiovascular - Regular rhythm and rate.   Mental Status -  Level of arousal and orientation to time, place, and person were intact. Language including expression, naming, repetition, comprehension was assessed and found intact. Mild dysarthria Fund of Knowledge was assessed and was intact.   Cranial Nerves II - XII - II - Visual field intact OU. III, IV, VI - Extraocular movements intact. V - Facial sensation intact bilaterally. VII - mild right facial droop. VIII - Hearing & vestibular intact bilaterally. X - Palate elevates symmetrically. XI - Chin turning & shoulder shrug intact bilaterally. XII - Tongue protrusion intact.   Motor Strength - The patient's strength was normal in all extremities and pronator drift was absent.  Bulk was normal and fasciculations were absent.   Motor Tone - Muscle tone was assessed at the neck and appendages and was normal.   Reflexes - The patient's reflexes were symmetrical in all extremities and he had no pathological reflexes.   Sensory - Light touch, temperature/pinprick were assessed and were symmetrical.     Coordination - The patient had normal movements in the hands and feet with no ataxia or dysmetria.  Tremor was absent.   Gait and Station - deferred. Discharge Diet        Diet   Diet Heart Room service appropriate? Yes; Fluid consistency: Thin   liquids  DISCHARGE PLAN Disposition:  Home  DAPT with ASA and Brilinta for secondary stroke prevention for 3 months then ASA alone. Ongoing stroke risk factor control by Primary Care Physician at time of discharge Follow-up PCP [facility] in 1- 2 weeks. Follow-up in [facility] in 4 weeks, office to schedule an appointment.   35 minutes were spent preparing discharge.  , NP-C  ATTENDING NOTE: I reviewed above note and agree with the assessment and plan. Pt was seen and examined.   No family at bedside.  Patient awake alert, orientated, slurred speech improved from yesterday.  Moving all extremities.  Patient lives with his friend.  PT/OT no recommendations.  Continue aspirin and Brilinta given bilateral stenting.  LDL 136, continue Lipitor 40.  Ready for discharge, will follow-up with neurology and interventional radiology as  outpatient.  For detailed assessment and plan, please refer to above as I have made changes wherever appropriate.   Marvel Plan, MD PhD Stroke Neurology 12/06/2020 7:56 PM

## 2020-12-06 NOTE — Plan of Care (Signed)
All peripheral IVs removed, sites clean, dry, intact at discharge. Discharge AVS & education provided to patient and family member via explanation and handout. Patient verbalizes understanding of discharge education and has no further questions at this time. All belongings sent home with patient.

## 2020-12-06 NOTE — Discharge Instructions (Addendum)
Make appointment right away to follow up with your primary care doctor in 1-2 weeks.   No matter how much better you feel please finish your antibiotic (Augmentin)  as prescribed.

## 2020-12-06 NOTE — Progress Notes (Signed)
NAME:  Travis Williams, MRN:  [id number], DOB:  1956/02/27, LOS: 3 ADMISSION DATE:  12/03/2020, CONSULTATION DATE:  12/03/20 REFERRING MD:  Stroke CHIEF COMPLAINT:  Vent Management   History of Present Illness:  Travis Williams is a 65 y.o. male who only has PMH known of HTN.  He presented to [facility] 9/15 with R hemiplegia and slurred speech. Last known well at 1030 and found on floor at 1230CT head was negative. 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  He was given TNKase and then required intubation.  He was then transferred to [facility] and was taken to IR and had mechanical thrombectomy with rescue stenting of unstable plaque and mid basilar artery. Of note, IR was unable to pass an OGT due to possible stricture.  Post procedure, he was admitted to neuro ICU where PCCM was asked to assist with vent management.  Pertinent  Medical History:  has Ischemic stroke (HCC) on their problem list.  Significant Hospital Events: Including procedures, antibiotic start and stop dates in addition to other pertinent events   9/15 > admit. 9/15: bronch to eval RUL, s/p tnk and revascularization with IR 9/16: extubated  Interim History / Subjective:  Sitting up in bed, no distress  Objective:  Blood pressure (!) 150/89, pulse 91, temperature 98.7 F (37.1 C), temperature source Axillary, resp. rate 18, height 5\' 11"  (1.803 m), SpO2 94 %.        Intake/Output Summary (Last 24 hours) at 12/06/2020 0908 Last data filed at 12/06/2020 0800 Gross per 24 hour  Intake 1607.44 ml  Output 1125 ml  Net 482.44 ml   There were no vitals filed for this visit.  Examination: General: resting comfortably, no distress Neuro: Aox4. Moves all 4 extremities, normal speech Cardiovascular: tachycardic, regular, no mrg Lungs: ctab no wheezes or crackles, stable on room air Abdomen: obese, soft,    Labs/imaging personally reviewed:  CTA head / neck 9/15 > right vertebral artery occlusion, high  grade stenosis of prox R ICA, multifocal occlusion of basilar artery. MRI brain 9/16 > small infarcts of multiple areas, mo mass effect Echo 9/16 > completed, LVEF 70-75%, no diastolic dysfunction  Resolved Hospital Problem list:  Respiratory failure  Assessment & Plan:   Multifocal basilar and R vertebral occlusion  - s/p TNKase followed by IR mechanical thrombectomy and rescue stenting of mid basilar artery. - Post procedure management per IR. - Stroke workup / management per neuro. - F/u on CT head, MRI brain, echo. -goal sbp 120-140  -BP management as below - continue DAPT - advancing with po after SLP eval. Cor trak out.   Aspiration PNA - Bronchial hygiene. - cultures show GPC, mrsa negative. Will narrow abx to unasyn for total 5 days abx. Stop date in place  Hypertension - off cleviprex - on amlodipine with prns - consider adding back hctz before discharge  Hyperglycemia. - SSI, likely stress induced - A2C 5.7  Best practice (evaluated daily):  Diet/type: NPO DVT prophylaxis: other GI prophylaxis: PPI Lines: N/A Foley:  N/A Code Status:  full code Last date of multidisciplinary goals of care discussion: patient updated at bedside.  PCCM will see as needed at this point. Please call with questions.  10/16, MD Pulmonary and Critical Care Medicine [facility]   CBC: Recent Labs  Lab 12/03/20 1302 12/03/20 1753 12/04/20 0516 12/05/20 0543 12/06/20 0317  WBC 13.5*  --  10.1 9.5 12.6*  NEUTROABS 8.8*  --   --   --   --  HGB 13.4 13.9 14.5 11.0* 11.7*  HCT 40.9 41.0 36.3* 34.1* 35.6*  MCV 86.8  --  87.9 89.0 86.2  PLT 298  --  308 220 229    Basic Metabolic Panel: Recent Labs  Lab 12/03/20 1345 12/03/20 1753 12/03/20 2235 12/04/20 0516 12/04/20 1752 12/05/20 0543 12/05/20 1634 12/06/20 0317  NA 133* 135 132* 130*  --  136  --  135  K 2.6* 3.1* 3.9 5.4*  --  3.2*  --  3.8  CL 95*  --  95* 95*  --  99  --  99  CO2  26  --  25 22  --  26  --  25  GLUCOSE 203*  --  115* 141*  --  159*  --  128*  BUN 13  --  11 12  --  13  --  12  CREATININE 1.10  --  0.79 1.05  --  0.93  --  0.93  CALCIUM 8.9  --  8.5* 8.0*  --  8.2*  --  8.6*  MG  --  2.1  --   --  2.7* 2.4 2.1 2.0  PHOS  --  2.6  --   --  3.9 4.3 3.0 3.9   GFR: Estimated Creatinine Clearance: 94 mL/min (by C-G formula based on SCr of 0.93 mg/dL). Recent Labs  Lab 12/03/20 1302 12/04/20 0516 12/05/20 0543 12/06/20 0317  WBC 13.5* 10.1 9.5 12.6*    Liver Function Tests: Recent Labs  Lab 12/03/20 1345 12/04/20 0516  AST 30 RESULTS UNAVAILABLE DUE TO INTERFERING SUBSTANCE  ALT 25 RESULTS UNAVAILABLE DUE TO INTERFERING SUBSTANCE  ALKPHOS 72 59  BILITOT 0.6 2.6*  PROT 7.3 RESULTS UNAVAILABLE DUE TO INTERFERING SUBSTANCE  ALBUMIN 3.9 3.2*   No results for input(s): LIPASE, AMYLASE in the last 168 hours. No results for input(s): AMMONIA in the last 168 hours.  ABG    Component Value Date/Time   PHART 7.426 12/03/2020 1753   PCO2ART 38.8 12/03/2020 1753   PO2ART 53 (L) 12/03/2020 1753   HCO3 25.7 12/03/2020 1753   TCO2 27 12/03/2020 1753   O2SAT 89.0 12/03/2020 1753     Coagulation Profile: Recent Labs  Lab 12/03/20 1345 12/03/20 1753  INR 1.0 1.0    Cardiac Enzymes: No results for input(s): CKTOTAL, CKMB, CKMBINDEX, TROPONINI in the last 168 hours.  HbA1C: Hgb A1c MFr Bld  Date/Time Value Ref Range Status  12/04/2020 05:16 AM 5.7 (H) 4.8 - 5.6 % Final    Comment:    (NOTE) Pre diabetes:          5.7%-6.4%  Diabetes:              >6.4%  Glycemic control for   <7.0% adults with diabetes     CBG: Recent Labs  Lab 12/03/20 2320 12/04/20 0325 12/04/20 0813 12/04/20 1144 12/04/20 1550  GLUCAP 117* 161* 159* 176* 161*

## 2020-12-06 NOTE — TOC Transition Note (Signed)
Transition of Care [facility]) - CM/SW Discharge Note   Patient Details  Name: Israel Werts MRN: [id number] Date of Birth: Jul 31, 1955  Transition of Care [facility] CM/SW Contact:  Bess Kinds, RN Phone Number: [phone] 12/06/2020, 1:47 PM   Clinical Narrative:     Spoke with patient at the bedside. Patient home with family. Has transportation home. Provided Brilinta 30 day card - advised that his pharmacy may be able to provide him with his copay for refills. Patient verbalized understanding to take medications as directed, follow up with his providers outpatient, and to exercise. No further TOC needs identified.   Final next level of care: Home/Self Care Barriers to Discharge: No Barriers Identified   Patient Goals and CMS Choice Patient states their goals for this hospitalization and ongoing recovery are:: return home with family CMS Medicare.gov Compare Post Acute Care list provided to:: Patient Choice offered to / list presented to : NA  Discharge Placement                       Discharge Plan and Services                DME Arranged: N/A DME Agency: NA       HH Arranged: NA HH Agency: NA        Social Determinants of Health (SDOH) Interventions     Readmission Risk Interventions No flowsheet data found.

## 2020-12-07 LAB — GLUCOSE, CAPILLARY
Glucose-Capillary: 104 mg/dL — ABNORMAL HIGH (ref 70–99)
Glucose-Capillary: 111 mg/dL — ABNORMAL HIGH (ref 70–99)
Glucose-Capillary: 125 mg/dL — ABNORMAL HIGH (ref 70–99)
Glucose-Capillary: 129 mg/dL — ABNORMAL HIGH (ref 70–99)
Glucose-Capillary: 132 mg/dL — ABNORMAL HIGH (ref 70–99)
Glucose-Capillary: 134 mg/dL — ABNORMAL HIGH (ref 70–99)
Glucose-Capillary: 151 mg/dL — ABNORMAL HIGH (ref 70–99)
Glucose-Capillary: 152 mg/dL — ABNORMAL HIGH (ref 70–99)
Glucose-Capillary: 153 mg/dL — ABNORMAL HIGH (ref 70–99)
Glucose-Capillary: 162 mg/dL — ABNORMAL HIGH (ref 70–99)
Glucose-Capillary: 166 mg/dL — ABNORMAL HIGH (ref 70–99)

## 2020-12-21 ENCOUNTER — Other Ambulatory Visit: Payer: Self-pay

## 2020-12-21 DIAGNOSIS — I10 Essential (primary) hypertension: Secondary | ICD-10-CM | POA: Insufficient documentation

## 2020-12-21 NOTE — Patient Outreach (Signed)
[facility]) Care Management  [facility] Care Manager  12/21/2020   Travis Williams 02/05/1956 [id number]  Subjective: Telephone call to patient for EMMI stroke follow up. Patient has not set his appointments with PCP and neurologist.  Discussed with patient the importance of follow up appointments. Patient declined needing help to set appointments.  Patient with new onset stroke but has no deficits.  Discussed stroke and stroke symptoms with patient and how important seeking medical assistance immediately. He verbalized understanding.    Patient reports he lives in the home with a friend.  He reports that he is independent with all care aspects.  Reviewed medications.  No discrepancies noted.  No trouble affording or getting medications.    Objective:   Encounter Medications:  Outpatient Encounter Medications as of 12/21/2020  Medication Sig   amLODipine (NORVASC) 10 MG tablet Take 1 tablet (10 mg total) by mouth daily.   aspirin 81 MG chewable tablet Chew 1 tablet (81 mg total) by mouth daily.   atorvastatin (LIPITOR) 40 MG tablet Take 1 tablet (40 mg total) by mouth daily.   lisinopril (ZESTRIL) 10 MG tablet Take 1 tablet (10 mg total) by mouth daily.   ticagrelor (BRILINTA) 90 MG TABS tablet Take 1 tablet (90 mg total) by mouth 2 (two) times daily.   No facility-administered encounter medications on file as of 12/21/2020.    Functional Status:  In your present state of health, do you have any difficulty performing the following activities: 12/21/2020  Hearing? N  Vision? N  Difficulty concentrating or making decisions? N  Walking or climbing stairs? N  Dressing or bathing? N  Doing errands, shopping? N  Preparing Food and eating ? N  Using the Toilet? N  In the past six months, have you accidently leaked urine? N  Do you have problems with loss of bowel control? N  Managing your Medications? N  Managing your Finances? N  Housekeeping or managing your Housekeeping? N     Fall/Depression Screening: Fall Risk  12/21/2020  Falls in the past year? 0   PHQ 2/9 Scores 12/21/2020  PHQ - 2 Score 0    Assessment:   Care Plan Care Plan : Hypertension (Adult)  Updates made by Fleeta Emmer, RN since 12/21/2020 12:00 AM     Problem: Hypertension (Hypertension)      Long-Range Goal: Hypertension Monitored as evidenced by blood pressure less than 140/80   Start Date: 12/21/2020  Expected End Date: 09/17/2021  Priority: High  Note:   Evidence-based guidance:  Promote initial use of ambulatory blood pressure measurements (for 3 days) to rule out "white-coat" effect; identify masked hypertension and presence or absence of nocturnal "dipping" of blood pressure.   Encourage continued use of home blood pressure monitoring and recording in blood pressure log; include symptoms of hypotension or potential medication side effects in log.  Review blood pressure measurements taken inside and outside of the provider office; establish baseline and monitor trends; compare to target ranges or patient goal.  Share overall cardiovascular risk with patient; encourage changes to lifestyle risk factors, including alcohol consumption, smoking, inadequate exercise, poor dietary habits and stress.   Notes:     Task: Identify and Monitor Blood Pressure Elevation   Due Date: 09/17/2021  Priority: Routine  Responsible User: Fleeta Emmer, RN  Note:   Care Management Activities:    - blood pressure trends reviewed - depression screen reviewed - home or ambulatory blood pressure monitoring encouraged    Notes: 12/21/20  Patient monitors blood pressure at home at least daily.  Blood pressure today 127/70.  Encouraged to continue to monitor.   Hypertension Management Discussed Take medications as ordered Limit salt intake. Eat plenty of fruits and vegetables Remain as active as possible Take blood pressure at least weekly at home and record in a notebook to take to doctor's  visits.     Problem: Resistant Hypertension (Hypertension)      Care Plan : Stroke (Adult)  Updates made by Fleeta Emmer, RN since 12/21/2020 12:00 AM     Problem: Recurrence (Stroke)      Goal: Stroke Recurrence Prevented or Minimized as evidenced by no rehospitalization for stroke   Start Date: 12/21/2020  Expected End Date: 02/17/2021  Priority: High  Note:   Evidence-based guidance:  Establish baseline by comparing pre and poststroke level of function using age-appropriate criteria; consider history of transient ischemia attack.  Assess for sudden or new changes in attention, vision changes, language pattern, motor control and mobility, as well as facial drooping, unilateral weakness, confusion and fatigue that may indicate recurrence.  Promote long-term management of risk factors, such as tobacco use, sedentary lifestyle, obesity, sleep disordered breathing, unhealthy diet and high alcohol consumption.  Prepare patient for cardiac monitoring (invasive or noninvasive) when cause of stroke is unknown; consider ankle-brachial index measurement as a means of predicting recurrence risk.  Monitor and manage comorbidity, such as atrial fibrillation, hypertension, carotid artery disease, cardiomyopathy, heart valve disease, sickle cell disease, coronary artery disease, sleep disordered breathing or depression.  Provide anticipatory guidance to women of childbearing age regarding potential discontinuation of oral contraceptives and alternative family planning method.   Assess and address adherence to pharmacologic therapy that may include antihypertensive, antiplatelet, anticoagulant, statin, fibrate and vitamin supplement; monitor and manage side effects.  Consider providing or referring to group-based support and/or education program; assess for cognitive impairment that may impact ability to follow therapeutic regimen.  Prepare patient for periodic checking of anticoagulant or antiplatelet  therapy effectiveness.   Notes:     Task: Optimize Stroke Recovery   Due Date: 02/17/2021  Priority: Routine  Responsible User: Fleeta Emmer, RN  Note:   Care Management Activities:    - depression screen reviewed - healthy lifestyle promoted - self or caregiver awareness of signs/symptoms of repeat stroke promoted - stroke signs/symptoms assessed and compared to baseline    Notes: 12/21/20 Patient with recent stroke.  Patient reports no deficits.  Discussed with patient stroke symptoms and the importance of assistance immediately. Stroke Symptoms Sudden numbness or weakness in the face, arm, or leg, especially on one side of the body. Sudden confusion, trouble speaking, or difficulty understanding speech. Sudden trouble seeing in one or both eyes. Sudden trouble walking, dizziness, loss of balance, or lack of coordination. Sudden severe headache with no known cause. Call 9-1-1 right away if you or someone else has any of these symptoms. During a stroke, every minute counts! Fast treatment Fast treatment can lessen the brain damage that stroke can cause. By knowing the signs and symptoms of stroke, you can take quick action and perhaps save a life.      Goals Addressed             This Visit's Progress    Keep or Improve My Strength-Stroke       Barriers: Knowledge Timeframe:  Short-Term Goal Priority:  High Start Date:     12/21/20  Expected End Date: 01/18/21                      Follow Up Date 01/18/21  - eat healthy to increase strength - increase activity or exercise time a little every week    Why is this important?   Before the stroke you probably did not think much about being safe when you are up and about.  Now, it may be harder for you to get around.  It may also be easier for you to trip or fall.  It is common to have muscle weakness after a stroke. You may also feel like you cannot control an arm or leg.  It will be helpful to  work with a physical therapist to get your strength and muscle control back.  It is good to stay as active as you can. Walking and stretching help you stay strong and flexible.  The physical therapist will develop an exercise program just for you.     Notes: 12/21/20 Patient with recent stroke. Patient denies any deficits from his stroke. Encouraged patient to become active and exercise as tolerated. Discussed signs of stroke. Stroke Symptoms Sudden numbness or weakness in the face, arm, or leg, especially on one side of the body. Sudden confusion, trouble speaking, or difficulty understanding speech. Sudden trouble seeing in one or both eyes. Sudden trouble walking, dizziness, loss of balance, or lack of coordination. Sudden severe headache with no known cause. Call 9-1-1 right away if you or someone else has any of these symptoms. During a stroke, every minute counts! Fast treatment Fast treatment can lessen the brain damage that stroke can cause. By knowing the signs and symptoms of stroke, you can take quick action and perhaps save a life.      Track and Manage My Blood Pressure-Hypertension       Barriers: Health Behaviors Timeframe:  Long-Range Goal Priority:  High Start Date:     12/21/20                        Expected End Date:      09/17/21                 Follow Up Date 01/18/21   - check blood pressure daily - write blood pressure results in a log or diary    Why is this important?   You won't feel high blood pressure, but it can still hurt your blood vessels.  High blood pressure can cause heart or kidney problems. It can also cause a stroke.  Making lifestyle changes like losing a little weight or eating less salt will help.  Checking your blood pressure at home and at different times of the day can help to control blood pressure.  If the doctor prescribes medicine remember to take it the way the doctor ordered.  Call the office if you cannot afford the medicine or if  there are questions about it.     Notes: 12/21/20 Patient reports checking blood pressure at least daily. Today's blood pressure 127/70.   Hypertension Management Discussed Take medications as ordered Limit salt intake. Eat plenty of fruits and vegetables Remain as active as possible Take blood pressure at least weekly at home and record in a notebook to take to doctor's visits.         Plan: RN CM will provide ongoing education and support to patient through phone calls.   RN CM  will send welcome packet with consent to patient.   RN CM will send initial barriers letter, assessment, and care plan to primary care physician.   RN CM will contact patient next week and patient agrees to next contact.   Follow-up: Patient agrees to Care Plan and Follow-up. Follow-up in 2-3 week(s)  [name], RN, MSN [facility] Care Management Care Management Coordinator Direct Line [phone] Cell [phone] Toll Free: [phone]  Fax: [phone]

## 2020-12-21 NOTE — Patient Instructions (Signed)
Goals Addressed             This Visit's Progress    Keep or Improve My Strength-Stroke       Barriers: Knowledge Timeframe:  Short-Term Goal Priority:  High Start Date:     12/21/20                        Expected End Date: 01/18/21                      Follow Up Date 01/18/21  - eat healthy to increase strength - increase activity or exercise time a little every week    Why is this important?   Before the stroke you probably did not think much about being safe when you are up and about.  Now, it may be harder for you to get around.  It may also be easier for you to trip or fall.  It is common to have muscle weakness after a stroke. You may also feel like you cannot control an arm or leg.  It will be helpful to work with a physical therapist to get your strength and muscle control back.  It is good to stay as active as you can. Walking and stretching help you stay strong and flexible.  The physical therapist will develop an exercise program just for you.     Notes: 12/21/20 Patient with recent stroke. Patient denies any deficits from his stroke. Encouraged patient to become active and exercise as tolerated. Discussed signs of stroke. Stroke Symptoms Sudden numbness or weakness in the face, arm, or leg, especially on one side of the body. Sudden confusion, trouble speaking, or difficulty understanding speech. Sudden trouble seeing in one or both eyes. Sudden trouble walking, dizziness, loss of balance, or lack of coordination. Sudden severe headache with no known cause. Call 9-1-1 right away if you or someone else has any of these symptoms. During a stroke, every minute counts! Fast treatment Fast treatment can lessen the brain damage that stroke can cause. By knowing the signs and symptoms of stroke, you can take quick action and perhaps save a life.      Track and Manage My Blood Pressure-Hypertension       Barriers: Health Behaviors Timeframe:  Long-Range Goal Priority:   High Start Date:     12/21/20                        Expected End Date:      09/17/21                 Follow Up Date 01/18/21   - check blood pressure daily - write blood pressure results in a log or diary    Why is this important?   You won't feel high blood pressure, but it can still hurt your blood vessels.  High blood pressure can cause heart or kidney problems. It can also cause a stroke.  Making lifestyle changes like losing a little weight or eating less salt will help.  Checking your blood pressure at home and at different times of the day can help to control blood pressure.  If the doctor prescribes medicine remember to take it the way the doctor ordered.  Call the office if you cannot afford the medicine or if there are questions about it.     Notes: 12/21/20 Patient reports checking blood pressure at least daily. Today's blood  pressure 127/70.   Hypertension Management Discussed Take medications as ordered Limit salt intake. Eat plenty of fruits and vegetables Remain as active as possible Take blood pressure at least weekly at home and record in a notebook to take to doctor's visits.

## 2021-01-04 ENCOUNTER — Other Ambulatory Visit: Payer: Self-pay

## 2021-01-04 NOTE — Patient Outreach (Signed)
[facility]) Care Management  01/04/2021  Travis Williams 1955/09/22 [id number]   Telephone call to patient for follow up. No answer. HIPAA compliant voice message left.  Plan: RN CM will attempt again within 4 business days.   [name], RN, MSN [facility] Care Management Care Management Coordinator Direct Line [phone] Cell [phone] Toll Free: [phone]  Fax: [phone]

## 2021-01-05 ENCOUNTER — Other Ambulatory Visit: Payer: Self-pay

## 2021-01-05 NOTE — Patient Outreach (Signed)
[facility]) Care Management  01/05/2021  Travis Williams 02-05-1956 [id number]   Telephone call to patient for follow up. No answer. HIPAA compliant voice message left.    Plan: RN CM will attempt again within 4 business days and send letter.  [name], RN, MSN [facility] Care Management Care Management Coordinator Direct Line [phone] Cell [phone] Toll Free: [phone]  Fax: [phone]

## 2021-01-06 ENCOUNTER — Other Ambulatory Visit: Payer: Self-pay

## 2021-01-06 NOTE — Patient Outreach (Signed)
[facility]) Care Management  01/06/2021  Travis Williams 12/23/1955 [id number]   Telephone call to patient for follow up. No answer.  HIPAA compliant voice message left.    Plan: RN CM will attempt again in the next 3 weeks.    [name], RN, MSN [facility] Care Management Care Management Coordinator Direct Line [phone] Cell [phone] Toll Free: [phone]  Fax: [phone]

## 2021-01-29 ENCOUNTER — Other Ambulatory Visit: Payer: Self-pay

## 2021-01-29 NOTE — Patient Instructions (Signed)
Patient Goals/Self-Care Activities: Patient will self administer medications as prescribed Patient will attend all scheduled provider appointments Limit salt intake Remain as active as possible Eat plenty of fruits and vegetables Take blood pressure at least weekly at home about one hour after medication and record in a notebook to take to doctor's visits.

## 2021-01-29 NOTE — Patient Outreach (Signed)
[facility]) Care Management  [place]  01/29/2021   Markelle Najarian [birth year] [id number]  Subjective: Telephone call to patient for follow up. Patient reports doing well.  No problems from stroke.  Reviewed stroke symptoms and stressed to get help immediately.  Patient blood pressure last check was 126/70.  Reviewed hypertension management and discussed medication adherence.  No concerns.   Objective:   Encounter Medications:  Outpatient Encounter Medications as of 01/29/2021  Medication Sig   amLODipine (NORVASC) 10 MG tablet Take 1 tablet (10 mg total) by mouth daily.   aspirin 81 MG chewable tablet Chew 1 tablet (81 mg total) by mouth daily.   atorvastatin (LIPITOR) 40 MG tablet Take 1 tablet (40 mg total) by mouth daily.   lisinopril (ZESTRIL) 10 MG tablet Take 1 tablet (10 mg total) by mouth daily.   ticagrelor (BRILINTA) 90 MG TABS tablet Take 1 tablet (90 mg total) by mouth 2 (two) times daily.   No facility-administered encounter medications on file as of 01/29/2021.    Functional Status:  In your present state of health, do you have any difficulty performing the following activities: 01/29/2021 12/21/2020  Hearing? N N  Vision? N N  Difficulty concentrating or making decisions? N N  Walking or climbing stairs? N N  Dressing or bathing? N N  Doing errands, shopping? N N  Preparing Food and eating ? N N  Using the Toilet? - N  In the past six months, have you accidently leaked urine? N N  Do you have problems with loss of bowel control? N N  Managing your Medications? N N  Managing your Finances? N N  Housekeeping or managing your Housekeeping? N N    Fall/Depression Screening: Fall Risk  01/29/2021 12/21/2020  Falls in the past year? 0 0   PHQ 2/9 Scores 01/29/2021 12/21/2020  PHQ - 2 Score 0 0    Assessment:   Care Plan Care Plan : Hypertension (Adult)  Updates made by Jon Billings, RN since 01/29/2021 12:00 AM  Completed  01/29/2021   Problem: Hypertension (Hypertension) Resolved 01/29/2021     Long-Range Goal: Hypertension Monitored as evidenced by blood pressure less than 140/80 Completed 01/29/2021  Start Date: 12/21/2020  Expected End Date: 09/17/2021  Priority: High  Note:   Evidence-based guidance:  Promote initial use of ambulatory blood pressure measurements (for 3 days) to rule out "white-coat" effect; identify masked hypertension and presence or absence of nocturnal "dipping" of blood pressure.   Encourage continued use of home blood pressure monitoring and recording in blood pressure log; include symptoms of hypotension or potential medication side effects in log.  Review blood pressure measurements taken inside and outside of the provider office; establish baseline and monitor trends; compare to target ranges or patient goal.  Share overall cardiovascular risk with patient; encourage changes to lifestyle risk factors, including alcohol consumption, smoking, inadequate exercise, poor dietary habits and stress.   Notes: 60/73/71 Resolving duplicate goal.    Task: Identify and Monitor Blood Pressure Elevation Completed 01/29/2021  Due Date: 09/17/2021  Priority: Routine  Responsible User: Jon Billings, RN  Note:   Care Management Activities:    - blood pressure trends reviewed - depression screen reviewed - home or ambulatory blood pressure monitoring encouraged    Notes: 12/21/20 Patient monitors blood pressure at home at least daily.  Blood pressure today 127/70.  Encouraged to continue to monitor.   Hypertension Management Discussed Take medications as ordered Limit salt intake. Eat  plenty of fruits and vegetables Remain as active as possible Take blood pressure at least weekly at home and record in a notebook to take to doctor's visits.     Problem: Resistant Hypertension (Hypertension) Resolved 01/29/2021     Care Plan : Stroke (Adult)  Updates made by Jon Billings, RN since  01/29/2021 12:00 AM  Completed 01/29/2021   Problem: Recurrence (Stroke) Resolved 01/29/2021     Goal: Stroke Recurrence Prevented or Minimized as evidenced by no rehospitalization for stroke Completed 01/29/2021  Start Date: 12/21/2020  Expected End Date: 02/17/2021  Priority: High  Note:   Evidence-based guidance:  Establish baseline by comparing pre and poststroke level of function using age-appropriate criteria; consider history of transient ischemia attack.  Assess for sudden or new changes in attention, vision changes, language pattern, motor control and mobility, as well as facial drooping, unilateral weakness, confusion and fatigue that may indicate recurrence.  Promote long-term management of risk factors, such as tobacco use, sedentary lifestyle, obesity, sleep disordered breathing, unhealthy diet and high alcohol consumption.  Prepare patient for cardiac monitoring (invasive or noninvasive) when cause of stroke is unknown; consider ankle-brachial index measurement as a means of predicting recurrence risk.  Monitor and manage comorbidity, such as atrial fibrillation, hypertension, carotid artery disease, cardiomyopathy, heart valve disease, sickle cell disease, coronary artery disease, sleep disordered breathing or depression.  Provide anticipatory guidance to women of childbearing age regarding potential discontinuation of oral contraceptives and alternative family planning method.   Assess and address adherence to pharmacologic therapy that may include antihypertensive, antiplatelet, anticoagulant, statin, fibrate and vitamin supplement; monitor and manage side effects.  Consider providing or referring to group-based support and/or education program; assess for cognitive impairment that may impact ability to follow therapeutic regimen.  Prepare patient for periodic checking of anticoagulant or antiplatelet therapy effectiveness.   Notes:     Task: Optimize Stroke Recovery Completed  01/29/2021  Due Date: 02/17/2021  Priority: Routine  Responsible User: Jon Billings, RN  Note:   Care Management Activities:    - depression screen reviewed - healthy lifestyle promoted - self or caregiver awareness of signs/symptoms of repeat stroke promoted - stroke signs/symptoms assessed and compared to baseline    Notes: 12/21/20 Patient with recent stroke.  Patient reports no deficits.  Discussed with patient stroke symptoms and the importance of assistance immediately. Stroke Symptoms Sudden numbness or weakness in the face, arm, or leg, especially on one side of the body. Sudden confusion, trouble speaking, or difficulty understanding speech. Sudden trouble seeing in one or both eyes. Sudden trouble walking, dizziness, loss of balance, or lack of coordination. Sudden severe headache with no known cause. Call 9-1-1 right away if you or someone else has any of these symptoms. During a stroke, every minute counts! Fast treatment Fast treatment can lessen the brain damage that stroke can cause. By knowing the signs and symptoms of stroke, you can take quick action and perhaps save a life. 01/29/21 Patient doing well.  Patient has appointment with neurologist coming up.  Patient monitoring blood pressure. Last check 126/70.  Reviewed signs of stroke.      Care Plan : RN CM Plan of Care  Updates made by Jon Billings, RN since 01/29/2021 12:00 AM     Problem: [place] Disease Management and Care Coodination Needs Hypertension   Priority: High     Long-Range Goal: Development of Plan of care for the management of Hypertension   Start Date: 01/29/2021  Expected End Date: 02/17/2022  Note:   Current Barriers:  Chronic Disease Management support and education needs related to HTN  RNCM Clinical Goal(s):  Patient will verbalize basic understanding of  HTN disease process and self health management plan   take all medications exactly as prescribed and will call provider for  medication related questions through collaboration with RN Care manager, provider, and care team.   Interventions: Education anf support provided for Hypertension self management Inter-disciplinary care team collaboration (see longitudinal plan of care) Evaluation of current treatment plan related to  self management and patient's adherence to plan as established by provider  Hypertension Interventions: Last practice recorded BP readings:  BP Readings from Last 3 Encounters:  12/06/20 (!) 159/77  12/03/20 (!) 154/85  Most recent eGFR/CrCl: No results found for: EGFR  No components found for: CRCL  Evaluation of current treatment plan related to hypertension self management and patient's adherence to plan as established by provider; Provided education to patient re: stroke prevention, s/s of heart attack and stroke; Discussed plans with patient for ongoing care management follow up and provided patient with direct contact information for care management team;  Patient Goals/Self-Care Activities: Patient will self administer medications as prescribed Patient will attend all scheduled provider appointments Limit salt intake Remain as active as possible Eat plenty of fruits and vegetables Take blood pressure at least weekly at home about one hour after medication and record in a notebook to take to doctor's visits.   Follow Up Plan:  Telephone follow up appointment with care management team member scheduled for:  January The patient has been provided with contact information for the care management team and has been advised to call with any health related questions or concerns.        Goals Addressed             This Visit's Progress    COMPLETED: Keep or Improve My Strength-Stroke   On track    Barriers: Knowledge Timeframe:  Short-Term Goal Priority:  High Start Date:     12/21/20                        Expected End Date: 01/18/21                      Follow Up Date  01/18/21  - eat healthy to increase strength - increase activity or exercise time a little every week    Why is this important?   Before the stroke you probably did not think much about being safe when you are up and about.  Now, it may be harder for you to get around.  It may also be easier for you to trip or fall.  It is common to have muscle weakness after a stroke. You may also feel like you cannot control an arm or leg.  It will be helpful to work with a physical therapist to get your strength and muscle control back.  It is good to stay as active as you can. Walking and stretching help you stay strong and flexible.  The physical therapist will develop an exercise program just for you.     Notes: 12/21/20 Patient with recent stroke. Patient denies any deficits from his stroke. Encouraged patient to become active and exercise as tolerated. Discussed signs of stroke. Stroke Symptoms Sudden numbness or weakness in the face, arm, or leg, especially on one side of the body. Sudden confusion, trouble speaking, or difficulty understanding  speech. Sudden trouble seeing in one or both eyes. Sudden trouble walking, dizziness, loss of balance, or lack of coordination. Sudden severe headache with no known cause. Call 9-1-1 right away if you or someone else has any of these symptoms. During a stroke, every minute counts! Fast treatment Fast treatment can lessen the brain damage that stroke can cause. By knowing the signs and symptoms of stroke, you can take quick action and perhaps save a life. 01/29/21 Patient denies any problems at this time.  Reviewed signs of stroke.       COMPLETED: Track and Manage My Blood Pressure-Hypertension   On track    Barriers: Health Behaviors Timeframe:  Long-Range Goal Priority:  High Start Date:     12/21/20                        Expected End Date:      09/17/21                 Follow Up Date 01/18/21   - check blood pressure daily - write blood pressure  results in a log or diary    Why is this important?   You won't feel high blood pressure, but it can still hurt your blood vessels.  High blood pressure can cause heart or kidney problems. It can also cause a stroke.  Making lifestyle changes like losing a little weight or eating less salt will help.  Checking your blood pressure at home and at different times of the day can help to control blood pressure.  If the doctor prescribes medicine remember to take it the way the doctor ordered.  Call the office if you cannot afford the medicine or if there are questions about it.     Notes: 12/21/20 Patient reports checking blood pressure at least daily. Today's blood pressure 127/70.   Hypertension Management Discussed Take medications as ordered Limit salt intake. Eat plenty of fruits and vegetables Remain as active as possible Take blood pressure at least weekly at home and record in a notebook to take to doctor's visits.  38/45/36 Resolving Duplicate goal        Plan:  Follow-up: Patient agrees to Care Plan and Follow-up. Follow-up in 2 month(s)   Jone Baseman, RN, MSN [facility] Care Management Coordinator Direct Line [phone] Cell [phone] Toll Free: [phone]  Fax: [phone]

## 2021-02-01 DIAGNOSIS — I739 Peripheral vascular disease, unspecified: Secondary | ICD-10-CM | POA: Diagnosis not present

## 2021-02-01 DIAGNOSIS — I6389 Other cerebral infarction: Secondary | ICD-10-CM | POA: Diagnosis not present

## 2021-02-01 DIAGNOSIS — F17200 Nicotine dependence, unspecified, uncomplicated: Secondary | ICD-10-CM | POA: Diagnosis not present

## 2021-02-01 DIAGNOSIS — I10 Essential (primary) hypertension: Secondary | ICD-10-CM | POA: Diagnosis not present

## 2021-02-01 DIAGNOSIS — E669 Obesity, unspecified: Secondary | ICD-10-CM | POA: Diagnosis not present

## 2021-02-01 DIAGNOSIS — E668 Other obesity: Secondary | ICD-10-CM | POA: Diagnosis not present

## 2021-02-08 DIAGNOSIS — I6389 Other cerebral infarction: Secondary | ICD-10-CM | POA: Diagnosis not present

## 2021-02-09 ENCOUNTER — Ambulatory Visit: Payer: Self-pay | Admitting: Cardiovascular Disease

## 2021-02-09 DIAGNOSIS — I6389 Other cerebral infarction: Secondary | ICD-10-CM | POA: Diagnosis not present

## 2021-02-09 DIAGNOSIS — F17200 Nicotine dependence, unspecified, uncomplicated: Secondary | ICD-10-CM | POA: Diagnosis not present

## 2021-02-09 DIAGNOSIS — E669 Obesity, unspecified: Secondary | ICD-10-CM | POA: Diagnosis not present

## 2021-02-09 DIAGNOSIS — I739 Peripheral vascular disease, unspecified: Secondary | ICD-10-CM | POA: Diagnosis not present

## 2021-02-09 DIAGNOSIS — I10 Essential (primary) hypertension: Secondary | ICD-10-CM | POA: Diagnosis not present

## 2021-02-09 DIAGNOSIS — I200 Unstable angina: Secondary | ICD-10-CM | POA: Insufficient documentation

## 2021-02-09 MED ORDER — SODIUM CHLORIDE 0.9% FLUSH: 3.0000 mL | Freq: Two times a day (BID) | INTRAVENOUS | Status: DC

## 2021-02-19 ENCOUNTER — Other Ambulatory Visit: Payer: Self-pay

## 2021-02-19 ENCOUNTER — Observation Stay
Admission: RE | Admit: 2021-02-19 | Discharge: 2021-02-20 | Disposition: A | Discharge status: Home / Self Care | Payer: Medicare HMO | Attending: Internal Medicine | Admitting: Internal Medicine

## 2021-02-19 ENCOUNTER — Encounter
Admission: RE | Disposition: A | Discharge status: Home / Self Care | Payer: Self-pay | Source: Home / Self Care | Attending: Internal Medicine

## 2021-02-19 ENCOUNTER — Encounter: Payer: Self-pay | Admitting: Cardiovascular Disease

## 2021-02-19 DIAGNOSIS — I2510 Atherosclerotic heart disease of native coronary artery without angina pectoris: Secondary | ICD-10-CM | POA: Diagnosis present

## 2021-02-19 DIAGNOSIS — I200 Unstable angina: Secondary | ICD-10-CM | POA: Diagnosis not present

## 2021-02-19 DIAGNOSIS — I10 Essential (primary) hypertension: Secondary | ICD-10-CM | POA: Diagnosis not present

## 2021-02-19 DIAGNOSIS — Z79899 Other long term (current) drug therapy: Secondary | ICD-10-CM | POA: Insufficient documentation

## 2021-02-19 DIAGNOSIS — I739 Peripheral vascular disease, unspecified: Secondary | ICD-10-CM | POA: Diagnosis not present

## 2021-02-19 DIAGNOSIS — Z87891 Personal history of nicotine dependence: Secondary | ICD-10-CM | POA: Insufficient documentation

## 2021-02-19 DIAGNOSIS — Z7982 Long term (current) use of aspirin: Secondary | ICD-10-CM | POA: Insufficient documentation

## 2021-02-19 DIAGNOSIS — Z20822 Contact with and (suspected) exposure to covid-19: Secondary | ICD-10-CM | POA: Diagnosis not present

## 2021-02-19 DIAGNOSIS — I639 Cerebral infarction, unspecified: Secondary | ICD-10-CM | POA: Diagnosis not present

## 2021-02-19 DIAGNOSIS — E876 Hypokalemia: Secondary | ICD-10-CM | POA: Insufficient documentation

## 2021-02-19 DIAGNOSIS — I25110 Atherosclerotic heart disease of native coronary artery with unstable angina pectoris: Principal | ICD-10-CM | POA: Insufficient documentation

## 2021-02-19 DIAGNOSIS — E785 Hyperlipidemia, unspecified: Secondary | ICD-10-CM | POA: Diagnosis present

## 2021-02-19 DIAGNOSIS — T502X5A Adverse effect of carbonic-anhydrase inhibitors, benzothiadiazides and other diuretics, initial encounter: Secondary | ICD-10-CM | POA: Diagnosis present

## 2021-02-19 HISTORY — PX: LEFT HEART CATH AND CORONARY ANGIOGRAPHY: CATH118249

## 2021-02-19 HISTORY — PX: CORONARY STENT INTERVENTION: CATH118234

## 2021-02-19 LAB — CBC
HCT: 31.8 % — ABNORMAL LOW (ref 39.0–52.0)
Hemoglobin: 10.1 g/dL — ABNORMAL LOW (ref 13.0–17.0)
MCH: 27.7 pg (ref 26.0–34.0)
MCHC: 31.8 g/dL (ref 30.0–36.0)
MCV: 87.4 fL (ref 80.0–100.0)
Platelets: 251 10*3/uL (ref 150–400)
RBC: 3.64 MIL/uL — ABNORMAL LOW (ref 4.22–5.81)
RDW: 14.6 % (ref 11.5–15.5)
WBC: 7.9 10*3/uL (ref 4.0–10.5)
nRBC: 0 % (ref 0.0–0.2)

## 2021-02-19 LAB — BASIC METABOLIC PANEL
Anion gap: 5 (ref 5–15)
BUN: 12 mg/dL (ref 8–23)
CO2: 25 mmol/L (ref 22–32)
Calcium: 8.3 mg/dL — ABNORMAL LOW (ref 8.9–10.3)
Chloride: 107 mmol/L (ref 98–111)
Creatinine, Ser: 0.87 mg/dL (ref 0.61–1.24)
GFR, Estimated: >60 mL/min (ref >60)
Glucose, Bld: 148 mg/dL — ABNORMAL HIGH (ref 70–99)
Potassium: 3.4 mmol/L — ABNORMAL LOW (ref 3.5–5.1)
Sodium: 137 mmol/L (ref 135–145)

## 2021-02-19 LAB — RESP PANEL BY RT-PCR (FLU A&B, COVID) ARPGX2
Influenza A by PCR: NEGATIVE
Influenza B by PCR: NEGATIVE
SARS Coronavirus 2 by RT PCR: NEGATIVE

## 2021-02-19 LAB — POCT ACTIVATED CLOTTING TIME: Activated Clotting Time: 456 seconds

## 2021-02-19 SURGERY — LEFT HEART CATH AND CORONARY ANGIOGRAPHY
Anesthesia: Moderate Sedation

## 2021-02-19 MED ORDER — HEPARIN (PORCINE) IN NACL 1000-0.9 UT/500ML-% IV SOLN
INTRAVENOUS | Status: AC
  Filled 2021-02-19: qty 1000

## 2021-02-19 MED ORDER — FENTANYL CITRATE (PF) 100 MCG/2ML IJ SOLN
INTRAMUSCULAR | Status: DC | PRN
  Administered 2021-02-19: 25 ug via INTRAVENOUS

## 2021-02-19 MED ORDER — SODIUM CHLORIDE 0.9 % IV SOLN
INTRAVENOUS | Status: DC | PRN
  Administered 2021-02-19: 1.75 mg/kg/h via INTRAVENOUS

## 2021-02-19 MED ORDER — SODIUM CHLORIDE 0.9 % WEIGHT BASED INFUSION
3.0000 mL/kg/h | INTRAVENOUS | Status: DC
  Administered 2021-02-19: 3 mL/kg/h via INTRAVENOUS

## 2021-02-19 MED ORDER — NITROGLYCERIN 0.4 MG SL SUBL: 0.4000 mg | SUBLINGUAL_TABLET | SUBLINGUAL | Status: DC | PRN

## 2021-02-19 MED ORDER — BIVALIRUDIN BOLUS VIA INFUSION - CUPID
INTRAVENOUS | Status: DC | PRN
  Administered 2021-02-19: 71.475 mg via INTRAVENOUS

## 2021-02-19 MED ORDER — SODIUM CHLORIDE 0.9 % WEIGHT BASED INFUSION
1.0000 mL/kg/h | INTRAVENOUS | Status: AC
  Administered 2021-02-19: 1 mL/kg/h via INTRAVENOUS

## 2021-02-19 MED ORDER — HEPARIN SODIUM (PORCINE) 1000 UNIT/ML IJ SOLN
INTRAMUSCULAR | Status: AC
  Filled 2021-02-19: qty 10

## 2021-02-19 MED ORDER — LIDOCAINE HCL 1 % IJ SOLN
INTRAMUSCULAR | Status: AC
  Filled 2021-02-19: qty 20

## 2021-02-19 MED ORDER — FENTANYL CITRATE (PF) 100 MCG/2ML IJ SOLN
INTRAMUSCULAR | Status: AC
  Filled 2021-02-19: qty 2

## 2021-02-19 MED ORDER — MIDAZOLAM HCL 2 MG/2ML IJ SOLN
INTRAMUSCULAR | Status: DC | PRN
  Administered 2021-02-19: 1 mg via INTRAVENOUS

## 2021-02-19 MED ORDER — LABETALOL HCL 5 MG/ML IV SOLN: 10.0000 mg | INTRAVENOUS | Status: AC | PRN

## 2021-02-19 MED ORDER — SODIUM CHLORIDE 0.9 % IV SOLN: 250.0000 mL | INTRAVENOUS | Status: DC | PRN

## 2021-02-19 MED ORDER — CILOSTAZOL 100 MG PO TABS
100.0000 mg | ORAL_TABLET | Freq: Two times a day (BID) | ORAL | Status: DC
  Administered 2021-02-19 – 2021-02-20 (×2): 100 mg via ORAL
  Filled 2021-02-19 (×3): qty 1

## 2021-02-19 MED ORDER — ASPIRIN 81 MG PO CHEW
CHEWABLE_TABLET | ORAL | Status: DC | PRN
  Administered 2021-02-19: 243 mg via ORAL

## 2021-02-19 MED ORDER — SODIUM CHLORIDE 0.9% FLUSH: 3.0000 mL | INTRAVENOUS | Status: DC | PRN

## 2021-02-19 MED ORDER — ASPIRIN 81 MG PO CHEW: 81.0000 mg | CHEWABLE_TABLET | ORAL | Status: DC

## 2021-02-19 MED ORDER — POTASSIUM CHLORIDE CRYS ER 20 MEQ PO TBCR
40.0000 meq | EXTENDED_RELEASE_TABLET | Freq: Once | ORAL | Status: AC
  Administered 2021-02-19: 40 meq via ORAL
  Filled 2021-02-19: qty 2

## 2021-02-19 MED ORDER — SODIUM CHLORIDE 0.9 % WEIGHT BASED INFUSION: 1.0000 mL/kg/h | INTRAVENOUS | Status: AC

## 2021-02-19 MED ORDER — BIVALIRUDIN TRIFLUOROACETATE 250 MG IV SOLR
INTRAVENOUS | Status: AC
  Filled 2021-02-19: qty 250

## 2021-02-19 MED ORDER — LABETALOL HCL 200 MG PO TABS
300.0000 mg | ORAL_TABLET | Freq: Two times a day (BID) | ORAL | Status: DC
  Administered 2021-02-19 – 2021-02-20 (×2): 300 mg via ORAL
  Filled 2021-02-19 (×3): qty 1

## 2021-02-19 MED ORDER — ONDANSETRON HCL 4 MG/2ML IJ SOLN: 4.0000 mg | Freq: Four times a day (QID) | INTRAMUSCULAR | Status: DC | PRN

## 2021-02-19 MED ORDER — HYDRALAZINE HCL 20 MG/ML IJ SOLN: 10.0000 mg | INTRAMUSCULAR | Status: AC | PRN

## 2021-02-19 MED ORDER — ATORVASTATIN CALCIUM 20 MG PO TABS
40.0000 mg | ORAL_TABLET | Freq: Every day | ORAL | Status: DC
  Administered 2021-02-20: 40 mg via ORAL
  Filled 2021-02-19: qty 2

## 2021-02-19 MED ORDER — HEPARIN (PORCINE) IN NACL 2000-0.9 UNIT/L-% IV SOLN
INTRAVENOUS | Status: DC | PRN
  Administered 2021-02-19: 1000 mL

## 2021-02-19 MED ORDER — AMLODIPINE BESYLATE 10 MG PO TABS
10.0000 mg | ORAL_TABLET | Freq: Every day | ORAL | Status: DC
  Administered 2021-02-20: 10 mg via ORAL
  Filled 2021-02-19: qty 1

## 2021-02-19 MED ORDER — ACETAMINOPHEN 325 MG PO TABS: 650.0000 mg | ORAL_TABLET | Freq: Four times a day (QID) | ORAL | Status: DC | PRN

## 2021-02-19 MED ORDER — SODIUM CHLORIDE 0.9 % IV SOLN
INTRAVENOUS | Status: AC | PRN
  Administered 2021-02-19: 200 mL via INTRAVENOUS

## 2021-02-19 MED ORDER — ACETAMINOPHEN 325 MG PO TABS: 650.0000 mg | ORAL_TABLET | ORAL | Status: DC | PRN

## 2021-02-19 MED ORDER — CLOPIDOGREL BISULFATE 75 MG PO TABS
ORAL_TABLET | ORAL | Status: DC | PRN
  Administered 2021-02-19: 300 mg via ORAL

## 2021-02-19 MED ORDER — HYDRALAZINE HCL 20 MG/ML IJ SOLN: 5.0000 mg | INTRAMUSCULAR | Status: DC | PRN

## 2021-02-19 MED ORDER — LISINOPRIL 10 MG PO TABS
10.0000 mg | ORAL_TABLET | Freq: Every day | ORAL | Status: DC
  Administered 2021-02-20: 10 mg via ORAL
  Filled 2021-02-19: qty 1

## 2021-02-19 MED ORDER — CLOPIDOGREL BISULFATE 75 MG PO TABS
ORAL_TABLET | ORAL | Status: AC
  Filled 2021-02-19: qty 4

## 2021-02-19 MED ORDER — ASPIRIN 81 MG PO CHEW
CHEWABLE_TABLET | ORAL | Status: AC
  Filled 2021-02-19: qty 3

## 2021-02-19 MED ORDER — MIDAZOLAM HCL 2 MG/2ML IJ SOLN
INTRAMUSCULAR | Status: AC
  Filled 2021-02-19: qty 2

## 2021-02-19 MED ORDER — ASPIRIN 81 MG PO CHEW
81.0000 mg | CHEWABLE_TABLET | Freq: Every day | ORAL | Status: DC
  Administered 2021-02-20: 81 mg via ORAL
  Filled 2021-02-19: qty 1

## 2021-02-19 MED ORDER — MORPHINE SULFATE (PF) 4 MG/ML IV SOLN: 2.0000 mg | INTRAVENOUS | Status: DC | PRN

## 2021-02-19 MED ORDER — SODIUM CHLORIDE 0.9% FLUSH
3.0000 mL | Freq: Two times a day (BID) | INTRAVENOUS | Status: DC
  Administered 2021-02-19: 3 mL via INTRAVENOUS

## 2021-02-19 MED ORDER — CLOPIDOGREL BISULFATE 75 MG PO TABS: ORAL_TABLET | ORAL | Status: DC | PRN

## 2021-02-19 MED ORDER — IOHEXOL 350 MG/ML SOLN
INTRAVENOUS | Status: DC | PRN
  Administered 2021-02-19: 174 mL

## 2021-02-19 MED ORDER — SODIUM CHLORIDE 0.9 % WEIGHT BASED INFUSION: 1.0000 mL/kg/h | INTRAVENOUS | Status: DC

## 2021-02-19 MED ORDER — CLOPIDOGREL BISULFATE 75 MG PO TABS
75.0000 mg | ORAL_TABLET | Freq: Every day | ORAL | Status: DC
  Administered 2021-02-20: 75 mg via ORAL
  Filled 2021-02-19: qty 1

## 2021-02-19 MED ORDER — ONDANSETRON HCL 4 MG/2ML IJ SOLN: 4.0000 mg | Freq: Three times a day (TID) | INTRAMUSCULAR | Status: DC | PRN

## 2021-02-19 MED ORDER — VERAPAMIL HCL 2.5 MG/ML IV SOLN
INTRAVENOUS | Status: AC
  Filled 2021-02-19: qty 2

## 2021-02-19 MED ORDER — IOHEXOL 350 MG/ML SOLN
INTRAVENOUS | Status: DC | PRN
  Administered 2021-02-19: 84 mL

## 2021-02-19 MED ORDER — LIDOCAINE HCL (PF) 1 % IJ SOLN
INTRAMUSCULAR | Status: DC | PRN
  Administered 2021-02-19: 20 mL

## 2021-02-19 SURGICAL SUPPLY — 20 items
BALLN EUPHORA RX 2.0X20 (BALLOONS) ×2
BALLOON EUPHORA RX 2.0X20 (BALLOONS) ×1 IMPLANT
CATH INFINITI 5FR ANG PIGTAIL (CATHETERS) ×2 IMPLANT
CATH INFINITI 5FR JL4 (CATHETERS) ×2 IMPLANT
CATH INFINITI JR4 5F (CATHETERS) ×2 IMPLANT
CATH VISTA GUIDE 6FR XB3.5 (CATHETERS) ×2 IMPLANT
DEVICE CLOSURE MYNXGRIP 6/7F (Vascular Products) ×2 IMPLANT
KIT ENCORE 26 ADVANTAGE (KITS) ×2 IMPLANT
KIT SYRINGE INJ CVI SPIKEX1 (MISCELLANEOUS) ×2 IMPLANT
NEEDLE PERC 18GX7CM (NEEDLE) ×2 IMPLANT
PACK CARDIAC CATH (CUSTOM PROCEDURE TRAY) ×2 IMPLANT
PROTECTION STATION PRESSURIZED (MISCELLANEOUS) ×2
SET ATX SIMPLICITY (MISCELLANEOUS) ×2 IMPLANT
SHEATH AVANTI 5FR X 11CM (SHEATH) ×2 IMPLANT
SHEATH AVANTI 6FR X 11CM (SHEATH) ×2 IMPLANT
STATION PROTECTION PRESSURIZED (MISCELLANEOUS) ×1 IMPLANT
STENT ONYX FRONTIER 2.0X22 (Permanent Stent) ×2 IMPLANT
TUBING CIL FLEX 10 FLL-RA (TUBING) ×2 IMPLANT
WIRE G HI TQ BMW 190 (WIRE) ×2 IMPLANT
WIRE GUIDERIGHT .035X150 (WIRE) ×2 IMPLANT

## 2021-02-19 NOTE — Progress Notes (Signed)
Patient stable to go home in AM, f/u tuseday next week at 9 AM.

## 2021-02-19 NOTE — Consult Note (Signed)
Travis Williams is a 65 y.o. male  [id number]  Primary Cardiologist: Adrian Blackwater Reason for Consultation: Abnormal stress test  HPI: This is a 65 year old African-American male who presented to further evaluation after abnormal stress test he denied chest pain but had intermittent claudication in his lower extremities.  He was a high risk patient with history of ischemic stroke.   Review of Systems: Has intermittent claudication   Past Medical History:  Diagnosis Date   Hypertension    Stroke [facility])     Medications Prior to Admission  Medication Sig Dispense Refill   amLODipine (NORVASC) 10 MG tablet Take 1 tablet (10 mg total) by mouth daily. 30 tablet 1   aspirin 81 MG chewable tablet Chew 1 tablet (81 mg total) by mouth daily.     atorvastatin (LIPITOR) 40 MG tablet Take 1 tablet (40 mg total) by mouth daily. 30 tablet 1   cilostazol (PLETAL) 100 MG tablet Take 100 mg by mouth 2 (two) times daily.     labetalol (NORMODYNE) 300 MG tablet Take 300 mg by mouth 2 (two) times daily.     lisinopril (ZESTRIL) 10 MG tablet Take 1 tablet (10 mg total) by mouth daily. 30 tablet 0   ticagrelor (BRILINTA) 90 MG TABS tablet Take 1 tablet (90 mg total) by mouth 2 (two) times daily. 60 tablet 2      [START ON 02/20/2021] aspirin  81 mg Oral Pre-Cath    Infusions:  sodium chloride     sodium chloride 200 mL (02/19/21 0808)   [START ON 02/20/2021] sodium chloride 3 mL/kg/hr (02/19/21 0728)   Followed by   Melene Muller ON 02/20/2021] sodium chloride      No Known Allergies  Social History   Socioeconomic History   Marital status: Single    Spouse name: Not on file   Number of children: Not on file   Years of education: Not on file   Highest education level: Not on file  Occupational History   Not on file  Tobacco Use   Smoking status: Former    Packs/day: 1.00    Years: 5.00    Pack years: 5.00    Types: Cigarettes    Quit date: 81    Years since quitting: 25.9   Smokeless  tobacco: Never  Vaping Use   Vaping Use: Never used  Substance and Sexual Activity   Alcohol use: Not Currently   Drug use: Never   Sexual activity: Not on file  Other Topics Concern   Not on file  Social History Narrative   Not on file   Social Determinants of Health   Financial Resource Strain: Not on file  Food Insecurity: No Food Insecurity   Worried About Programme researcher, broadcasting/film/video in the Last Year: Never true   Ran Out of Food in the Last Year: Never true  Transportation Needs: No Transportation Needs   Lack of Transportation (Medical): No   Lack of Transportation (Non-Medical): No  Physical Activity: Not on file  Stress: Not on file  Social Connections: Not on file  Intimate Partner Violence: Not on file    History reviewed. No pertinent family history.  PHYSICAL EXAM: Vitals:   02/19/21 0702 02/19/21 0746  BP: (!) 152/78   Pulse: 83   Resp: 18   Temp: 97.8 F (36.6 C)   SpO2: 98% 100%    No intake or output data in the 24 hours ending 02/19/21 0817  General:  Well appearing. No respiratory  difficulty HEENT: normal Neck: supple. no JVD. Carotids 2+ bilat; no bruits. No lymphadenopathy or thryomegaly appreciated. Cor: PMI nondisplaced. Regular rate & rhythm. No rubs, gallops or murmurs. Lungs: clear Abdomen: soft, nontender, nondistended. No hepatosplenomegaly. No bruits or masses. Good bowel sounds. Extremities: no cyanosis, clubbing, rash, edema Neuro: alert & oriented x 3, cranial nerves grossly intact. moves all 4 extremities w/o difficulty. Affect pleasant.  ECG: Normal sinus rhythm no acute changes  No results found for this or any previous visit (from the past 24 hour(s)). CARDIAC CATHETERIZATION  Result Date: 02/19/2021   Dist Cx lesion is 100% stenosed.   Dist LAD lesion is 75% stenosed.   RPAV lesion is 55% stenosed.   The left ventricular systolic function is normal.   LV end diastolic pressure is normal.   The left ventricular ejection fraction is  greater than 65% by visual estimate.   There is no mitral valve regurgitation. High grade lesion in LAD distally and occluded distal LCX with colaterals from RCA, normal EF. Advise PCI of LAD.     ASSESSMENT AND PLAN: Ischemia in the left circumflex and LAD territory with LVEF 79%.  There is moderate sized reversible defect in the inferolateral and anterolateral areas suggestive of high risk stress test thus cardiac catheterization was done.  Cardiac catheterization revealed occluded distal left circumflex moderate disease in the PDA and 75 to 80% lesion in the mid to distal LAD.  LVEF was normal with normal wall motion.  Patient is undergoing PCI with drug-eluting stent in the distal LAD.  Patient will be observed overnight and will be put on aspirin and Plavix and continue rest of the medication.  ABI revealed left-sided 0.32 and right side is 0.68 chest runoff will be done eventually.  Brilinta will be discontinued.  Patient will be admitted under hospitalist service.  Darrall Strey A

## 2021-02-19 NOTE — H&P (Signed)
History and Physical    Travis Williams VCB:[id number] DOB: [birth year] DOA: 02/19/2021  Referring MD/NP/PA:   PCP: Travis David, MD   Patient coming from:  The patient is coming from home.    Chief Complaint: need cardiac catheterization  HPI: Travis Williams is a 65 y.o. male with medical history significant of hypertension, stroke, CAD, HLD, who presents for scheduled cardiac catheterization by Dr. Humphrey Rolls of cardiology.  Per Dr. Humphrey Rolls of cardiology, pt had abnormal stress test.  Patient is scheduled for cardiac catheterization today. Pt is s/p of successful cardiac cath with drug-eluting stent placed to the distal LAD. When I saw pt in cath lab, patient denies any chest pain, cough, shortness of breath.  No fever or chills.  No nausea, vomiting, diarrhea or abdominal pain.  No symptoms of UTI.  Patient states that he has intermittent claudication and bilateral leg pain on walking.  Cardiac cath today:   Dist LAD lesion is 80% stenosed.   A drug-eluting stent was successfully placed using a STENT ONYX FRONTIER 2.0X22.   Post intervention, there is a 0% residual stenosis.   Conclusion Successful PCI and stent of distal LAD 80% lesion Deployment of a 2.0 x22 mm Onyx frontier to 14 atm Lesion reduced from 80 down to 0% TIMI-3 flow before and after the case Minx deployed Patient was to continue Plavix and aspirin for 12 months  Lab: WBC 7.9, potassium 3.4, renal function okay, negative COVID PCR.  Patient is placed on progressive bed of observation   Review of Systems:   General: no fevers, chills, no body weight gain, has fatigue HEENT: no blurry vision, hearing changes or sore throat Respiratory: no dyspnea, coughing, wheezing CV: no chest pain, no palpitations GI: no nausea, vomiting, abdominal pain, diarrhea, constipation GU: no dysuria, burning on urination, increased urinary frequency, hematuria  Ext: no leg edema Neuro: no unilateral weakness, numbness, or tingling, no vision  change or hearing loss Skin: no rash, no skin tear. MSK: No muscle spasm, no deformity, no limitation of range of movement in spin. Has intermittent claudication in both legs Heme: No easy bruising.  Travel history: No recent long distant travel.  Allergy: No Known Allergies  Past Medical History:  Diagnosis Date   Hypertension    Stroke [facility])     Past Surgical History:  Procedure Laterality Date   CORONARY STENT INTERVENTION N/A 02/19/2021   Procedure: CORONARY STENT INTERVENTION;  Surgeon: Yolonda Kida, MD;  Location: [place] [facility];  Service: Cardiovascular;  Laterality: N/A;   IR CT HEAD LTD  12/03/2020   IR INTRA CRAN STENT  12/03/2020   IR PERCUTANEOUS ART THROMBECTOMY/INFUSION INTRACRANIAL INC DIAG ANGIO  12/03/2020   IR US GUIDE VASC ACCESS RIGHT  12/03/2020   LEFT HEART CATH AND CORONARY ANGIOGRAPHY N/A 02/19/2021   Procedure: LEFT HEART CATH AND CORONARY ANGIOGRAPHY with intervention;  Surgeon: Travis David, MD;  Location: [place] [facility];  Service: Cardiovascular;  Laterality: N/A;   RADIOLOGY WITH ANESTHESIA N/A 12/03/2020   Procedure: IR WITH ANESTHESIA;  Surgeon: Radiologist, Medication, MD;  Location: [place];  Service: Radiology;  Laterality: N/A;    Social History:  reports that he quit smoking about 25 years ago. His smoking use included cigarettes. He has a 5.00 pack-year smoking history. He has never used smokeless tobacco. He reports that he does not currently use alcohol. He reports that he does not use drugs.  Family History:  Family History  Problem Relation Age  of Onset   Hypertension Brother      Prior to Admission medications   Medication Sig Start Date End Date Taking? Authorizing Provider  amLODipine (NORVASC) 10 MG tablet Take 1 tablet (10 mg total) by mouth daily. 12/07/20  Yes Bailey-Modzik, Delila A, NP  aspirin 81 MG chewable tablet Chew 1 tablet (81 mg total) by mouth daily. 12/07/20  Yes Bailey-Modzik, Delila A, NP  atorvastatin  (LIPITOR) 40 MG tablet Take 1 tablet (40 mg total) by mouth daily. 12/07/20  Yes Bailey-Modzik, Delila A, NP  cilostazol (PLETAL) 100 MG tablet Take 100 mg by mouth 2 (two) times daily.   Yes [provider]  labetalol (NORMODYNE) 300 MG tablet Take 300 mg by mouth 2 (two) times daily.   Yes [provider]  lisinopril (ZESTRIL) 10 MG tablet Take 1 tablet (10 mg total) by mouth daily. 12/07/20  Yes Bailey-Modzik, Delila A, NP  ticagrelor (BRILINTA) 90 MG TABS tablet Take 1 tablet (90 mg total) by mouth 2 (two) times daily. 12/06/20  Yes Bailey-Modzik, Mayo Ao A, NP    Physical Exam: Vitals:   02/19/21 1130 02/19/21 1158 02/19/21 1248 02/19/21 1443  BP: 124/76 127/76  138/69  Pulse: 82 85  91  Resp: (!) 23 17  18   Temp:  97.8 F (36.6 C)  98.2 F (36.8 C)  TempSrc:      SpO2: 99% 99%  99%  Weight:   94.8 kg   Height:   5' 6"  (1.676 m)    General: Not in acute distress HEENT:       Eyes: PERRL, EOMI, no scleral icterus.       ENT: No discharge from the ears and nose, no pharynx injection, no tonsillar enlargement.        Neck: No JVD, no bruit, no mass felt. Heme: No neck lymph node enlargement. Cardiac: S1/S2, RRR, No murmurs, No gallops or rubs. Respiratory: No rales, wheezing, rhonchi or rubs. GI: Soft, nondistended, nontender, no rebound pain, no organomegaly, BS present. GU: No hematuria Ext: No pitting leg edema bilaterally. 1+DP/PT pulse bilaterally. Musculoskeletal: No joint deformities, No joint redness or warmth, no limitation of ROM in spin. Skin: No rashes.  Neuro: Alert, oriented X3, cranial nerves II-XII grossly intact, moves all extremities normally.  Psych: Patient is not psychotic, no suicidal or hemocidal ideation.  Labs on Admission: I have personally reviewed following labs and imaging studies  CBC: Recent Labs  Lab 02/19/21 1244  WBC 7.9  HGB 10.1*  HCT 31.8*  MCV 87.4  PLT 706   Basic Metabolic Panel: Recent Labs  Lab 02/19/21 1244   NA 137  K 3.4*  CL 107  CO2 25  GLUCOSE 148*  BUN 12  CREATININE 0.87  CALCIUM 8.3*   GFR: Estimated Creatinine Clearance: 90 mL/min (by C-G formula based on SCr of 0.87 mg/dL). Liver Function Tests: No results for input(s): AST, ALT, ALKPHOS, BILITOT, PROT, ALBUMIN in the last 168 hours. No results for input(s): LIPASE, AMYLASE in the last 168 hours. No results for input(s): AMMONIA in the last 168 hours. Coagulation Profile: No results for input(s): INR, PROTIME in the last 168 hours. Cardiac Enzymes: No results for input(s): CKTOTAL, CKMB, CKMBINDEX, TROPONINI in the last 168 hours. BNP (last 3 results) No results for input(s): PROBNP in the last 8760 hours. HbA1C: No results for input(s): HGBA1C in the last 72 hours. CBG: No results for input(s): GLUCAP in the last 168 hours. Lipid Profile: No results for input(s): CHOL,  HDL, LDLCALC, TRIG, CHOLHDL, LDLDIRECT in the last 72 hours. Thyroid Function Tests: No results for input(s): TSH, T4TOTAL, FREET4, T3FREE, THYROIDAB in the last 72 hours. Anemia Panel: No results for input(s): VITAMINB12, FOLATE, FERRITIN, TIBC, IRON, RETICCTPCT in the last 72 hours. Urine analysis:    Component Value Date/Time   COLORURINE STRAW (A) 12/03/2020 1803   APPEARANCEUR CLEAR 12/03/2020 1803   LABSPEC 1.041 (H) 12/03/2020 1803   PHURINE 8.0 12/03/2020 1803   GLUCOSEU NEGATIVE 12/03/2020 1803   HGBUR NEGATIVE 12/03/2020 1803   BILIRUBINUR NEGATIVE 12/03/2020 1803   KETONESUR NEGATIVE 12/03/2020 1803   PROTEINUR NEGATIVE 12/03/2020 1803   NITRITE NEGATIVE 12/03/2020 1803   LEUKOCYTESUR NEGATIVE 12/03/2020 1803   Sepsis Labs: @LABRCNTIP (procalcitonin:4,lacticidven:4) ) Recent Results (from the past 240 hour(s))  Resp Panel by RT-PCR (Flu A&B, Covid) Nasopharyngeal Swab     Status: None   Collection Time: 02/19/21  9:41 AM   Specimen: Nasopharyngeal Swab; Nasopharyngeal(NP) swabs in vial transport medium  Result Value Ref Range  Status   SARS Coronavirus 2 by RT PCR NEGATIVE NEGATIVE Final    Comment: (NOTE) SARS-CoV-2 target nucleic acids are NOT DETECTED.  The SARS-CoV-2 RNA is generally detectable in upper respiratory specimens during the acute phase of infection. The lowest concentration of SARS-CoV-2 viral copies this assay can detect is 138 copies/mL. A negative result does not preclude SARS-Cov-2 infection and should not be used as the sole basis for treatment or other patient management decisions. A negative result may occur with  improper specimen collection/handling, submission of specimen other than nasopharyngeal swab, presence of viral mutation(s) within the areas targeted by this assay, and inadequate number of viral copies(<138 copies/mL). A negative result must be combined with clinical observations, patient history, and epidemiological information. The expected result is Negative.  Fact Sheet for Patients:  EntrepreneurPulse.com.au  Fact Sheet for Healthcare Providers:  IncredibleEmployment.be  This test is no t yet approved or cleared by the [place] FDA and  has been authorized for detection and/or diagnosis of SARS-CoV-2 by FDA under an Emergency Use Authorization (EUA). This EUA will remain  in effect (meaning this test can be used) for the duration of the COVID-19 declaration under Section 564(b)(1) of the Act, 21 U.S.C.section 360bbb-3(b)(1), unless the authorization is terminated  or revoked sooner.       Influenza A by PCR NEGATIVE NEGATIVE Final   Influenza B by PCR NEGATIVE NEGATIVE Final    Comment: (NOTE) The Xpert Xpress SARS-CoV-2/FLU/RSV plus assay is intended as an aid in the diagnosis of influenza from Nasopharyngeal swab specimens and should not be used as a sole basis for treatment. Nasal washings and aspirates are unacceptable for Xpert Xpress SARS-CoV-2/FLU/RSV testing.  Fact Sheet for  Patients: EntrepreneurPulse.com.au  Fact Sheet for Healthcare Providers: IncredibleEmployment.be  This test is not yet approved or cleared by the [place] FDA and has been authorized for detection and/or diagnosis of SARS-CoV-2 by FDA under an Emergency Use Authorization (EUA). This EUA will remain in effect (meaning this test can be used) for the duration of the COVID-19 declaration under Section 564(b)(1) of the Act, 21 U.S.C. section 360bbb-3(b)(1), unless the authorization is terminated or revoked.  Performed at [facility], [address], [place], [place] [address]      Radiological Exams on Admission: CARDIAC CATHETERIZATION  Result Date: 02/19/2021   Dist Cx lesion is 100% stenosed.   Dist LAD lesion is 75% stenosed.   RPAV lesion is 55% stenosed.   The left  ventricular systolic function is normal.   LV end diastolic pressure is normal.   The left ventricular ejection fraction is greater than 65% by visual estimate.   There is no mitral valve regurgitation. High grade lesion in LAD distally and occluded distal LCX with colaterals from RCA, normal EF. Advise PCI of LAD.   CARDIAC CATHETERIZATION  Result Date: 02/19/2021   Dist LAD lesion is 80% stenosed.   A drug-eluting stent was successfully placed using a STENT ONYX FRONTIER 2.0X22.   Post intervention, there is a 0% residual stenosis. Conclusion Successful PCI and stent of distal LAD 80% lesion Deployment of a 2.0 x22 mm Onyx frontier to 14 atm Lesion reduced from 80 down to 0% TIMI-3 flow before and after the case Minx deployed Patient was to continue Plavix and aspirin for 12 months Patient to follow-up with Dr. Yancey Flemings Anticipate discharge tomorrow     EKG: I have personally reviewed.  Sinus rhythm, QTC 440, LAE.  Assessment/Plan Principal Problem:   CAD (coronary artery disease) Active Problems:   Ischemic stroke (HCC)   Essential hypertension   PVD (peripheral  vascular disease) (HCC)   HLD (hyperlipidemia)   Hypokalemia   CAD (coronary artery disease): s/p of DES to distal LAD. Pt does not have chest pain or shortness breath currently. Dr. Humphrey Rolls of card is on board. A1c 5.7 on 12/04/20 and LDL 138 on 12/06/20.  -Placed progressive bed for observation - continue ASA and plavix - lipitor 40 mg daily - F/u card recommendations  Ischemic stroke ([place]) -Lipitor, aspirin, Plavix  HLD: -lipitor  Essential hypertension -IV hydralazine as needed -Amlodipine, labetalol, lisinopril  PVD (peripheral vascular disease) ([place]): per Dr. Humphrey Rolls of card,  ABI revealed left-sided 0.32 and right side is 0.68. Chest runoff will be done eventually. Per Dr. Humphrey Rolls, this issue will be worked out as outpt.  -continue cilostazol  Hypokalemia: K 3.4 -Repleted potassium -Check magnesium level         DVT ppx: SCD Code Status: Full code Family Communication Yes, patient's partner by phone Disposition Plan:  Anticipate discharge back to previous environment Consults called:  Dr. Humphrey Rolls of card Admission status and Level of care: Progressive:    for obs      Status is: Observation  The patient remains OBS appropriate and will d/c before 2 midnights.          Date of Service 02/19/2021    Ivor Costa [facility]   If 7PM-7AM, please contact night-coverage [email or web address] 02/19/2021, 3:31 PM

## 2021-02-20 DIAGNOSIS — Z9861 Coronary angioplasty status: Secondary | ICD-10-CM | POA: Diagnosis not present

## 2021-02-20 DIAGNOSIS — E876 Hypokalemia: Secondary | ICD-10-CM | POA: Diagnosis not present

## 2021-02-20 DIAGNOSIS — I25110 Atherosclerotic heart disease of native coronary artery with unstable angina pectoris: Secondary | ICD-10-CM | POA: Diagnosis not present

## 2021-02-20 DIAGNOSIS — Z87891 Personal history of nicotine dependence: Secondary | ICD-10-CM | POA: Diagnosis not present

## 2021-02-20 DIAGNOSIS — Z7982 Long term (current) use of aspirin: Secondary | ICD-10-CM | POA: Diagnosis not present

## 2021-02-20 DIAGNOSIS — I10 Essential (primary) hypertension: Secondary | ICD-10-CM | POA: Diagnosis not present

## 2021-02-20 DIAGNOSIS — Z79899 Other long term (current) drug therapy: Secondary | ICD-10-CM | POA: Diagnosis not present

## 2021-02-20 DIAGNOSIS — Z20822 Contact with and (suspected) exposure to covid-19: Secondary | ICD-10-CM | POA: Diagnosis not present

## 2021-02-20 LAB — CBC
HCT: 31.1 % — ABNORMAL LOW (ref 39.0–52.0)
Hemoglobin: 9.7 g/dL — ABNORMAL LOW (ref 13.0–17.0)
MCH: 27.3 pg (ref 26.0–34.0)
MCHC: 31.2 g/dL (ref 30.0–36.0)
MCV: 87.6 fL (ref 80.0–100.0)
Platelets: 255 10*3/uL (ref 150–400)
RBC: 3.55 MIL/uL — ABNORMAL LOW (ref 4.22–5.81)
RDW: 14.6 % (ref 11.5–15.5)
WBC: 8.6 10*3/uL (ref 4.0–10.5)
nRBC: 0 % (ref 0.0–0.2)

## 2021-02-20 LAB — BASIC METABOLIC PANEL
Anion gap: 8 (ref 5–15)
BUN: 12 mg/dL (ref 8–23)
CO2: 23 mmol/L (ref 22–32)
Calcium: 8.4 mg/dL — ABNORMAL LOW (ref 8.9–10.3)
Chloride: 105 mmol/L (ref 98–111)
Creatinine, Ser: 0.88 mg/dL (ref 0.61–1.24)
GFR, Estimated: >60 mL/min (ref >60)
Glucose, Bld: 111 mg/dL — ABNORMAL HIGH (ref 70–99)
Potassium: 4 mmol/L (ref 3.5–5.1)
Sodium: 136 mmol/L (ref 135–145)

## 2021-02-20 LAB — MAGNESIUM: Magnesium: 2 mg/dL (ref 1.7–2.4)

## 2021-02-20 MED ORDER — CLOPIDOGREL BISULFATE 75 MG PO TABS: 75.0000 mg | ORAL_TABLET | Freq: Every day | ORAL | 2 refills | Status: DC

## 2021-02-20 MED ORDER — NITROGLYCERIN 0.4 MG SL SUBL: 0.4000 mg | SUBLINGUAL_TABLET | SUBLINGUAL | 12 refills | Status: DC | PRN

## 2021-02-20 NOTE — Discharge Summary (Signed)
[place] [facility] [place] NAME: Travis Williams    MR#:  [id number]  DATE OF BIRTH:  03/04/1956  DATE OF ADMISSION:  02/19/2021 ADMITTING PHYSICIAN: Ivor Costa, MD  DATE OF DISCHARGE: 02/20/2021  PRIMARY CARE PHYSICIAN: Dionisio David, MD    ADMISSION DIAGNOSIS:  Unstable angina ([place]) [I20.0]  DISCHARGE DIAGNOSIS:  CAD s/p cath with DES stent in distal LAD  SECONDARY DIAGNOSIS:   Past Medical History:  Diagnosis Date   Hypertension    Stroke [facility])     HOSPITAL COURSE:   Travis Williams is a 65 y.o. male with medical history significant of hypertension, stroke, CAD, HLD, who presents for scheduled cardiac catheterization by Dr. Humphrey Rolls of cardiology.  Per Dr. Humphrey Rolls of cardiology, pt had abnormal stress test.  Patient was scheduled for cardiac catheterization today. Pt is s/p of successful cardiac cath with drug-eluting stent placed to the distal LAD.  CAD (coronary artery disease): s/p of DES to distal LAD. Pt does not have chest pain or shortness breath currently. Dr. Humphrey Rolls of card is on board. A1c 5.7 on 12/04/20 and LDL 138 on 12/06/20.  - continue ASA and plavix - lipitor 40 mg daily - F/u card recommendations --pt to f/u next Tuesday with dr Humphrey Rolls   Ischemic stroke [facility]) -Lipitor, aspirin, Plavix   HLD: -lipitor   Essential hypertension -IV hydralazine as needed -Amlodipine, labetalol, lisinopril   PVD (peripheral vascular disease) ([place]): per Dr. Humphrey Rolls of card,  ABI revealed left-sided 0.32 and right side is 0.68. Chest runoff will be done eventually. Per Dr. Humphrey Rolls, this issue will be worked out as outpt.  -continue cilostazol   Hypokalemia: K 3.4 -Repleted potassium  patient overall hemodynamically stable. Will discharged to home with outpatient follow-up with cardiology next Tuesday. Patient aware of appointment.        CONSULTS OBTAINED:    DRUG ALLERGIES:  No Known Allergies  DISCHARGE MEDICATIONS:   Allergies as of  02/20/2021   No Known Allergies      Medication List     STOP taking these medications    ticagrelor 90 MG Tabs tablet Commonly known as: BRILINTA       TAKE these medications    amLODipine 10 MG tablet Commonly known as: NORVASC Take 1 tablet (10 mg total) by mouth daily.   aspirin 81 MG chewable tablet Chew 1 tablet (81 mg total) by mouth daily.   atorvastatin 40 MG tablet Commonly known as: LIPITOR Take 1 tablet (40 mg total) by mouth daily.   cilostazol 100 MG tablet Commonly known as: PLETAL Take 100 mg by mouth 2 (two) times daily.   clopidogrel 75 MG tablet Commonly known as: PLAVIX Take 1 tablet (75 mg total) by mouth daily with breakfast. Start taking on: February 21, 2021   labetalol 300 MG tablet Commonly known as: NORMODYNE Take 300 mg by mouth 2 (two) times daily.   lisinopril 10 MG tablet Commonly known as: ZESTRIL Take 1 tablet (10 mg total) by mouth daily.   nitroGLYCERIN 0.4 MG SL tablet Commonly known as: NITROSTAT Place 1 tablet (0.4 mg total) under the tongue every 5 (five) minutes as needed for chest pain.        If you experience worsening of your admission symptoms, develop shortness of breath, life threatening emergency, suicidal or homicidal thoughts you must seek medical attention immediately by calling 911 or calling your MD immediately  if symptoms less severe.  You Must read complete  instructions/literature along with all the possible adverse reactions/side effects for all the Medicines you take and that have been prescribed to you. Take any new Medicines after you have completely understood and accept all the possible adverse reactions/side effects.   Please note  You were cared for by a hospitalist during your hospital stay. If you have any questions about your discharge medications or the care you received while you were in the hospital after you are discharged, you can call the unit and asked to speak with the hospitalist on  call if the hospitalist that took care of you is not available. Once you are discharged, your primary care physician will handle any further medical issues. Please note that NO REFILLS for any discharge medications will be authorized once you are discharged, as it is imperative that you return to your primary care physician (or establish a relationship with a primary care physician if you do not have one) for your aftercare needs so that they can reassess your need for medications and monitor your lab values. Today   SUBJECTIVE   No new complaints  VITAL SIGNS:  Blood pressure 133/75, pulse 88, temperature 98 F (36.7 C), resp. rate 18, height _0  (1.676 m), weight 94.8 kg, SpO2 97 %.  I/O:   Intake/Output Summary (Last 24 hours) at 02/20/2021 1013 Last data filed at 02/20/2021 0814 Gross per 24 hour  Intake 2310.86 ml  Output 1000 ml  Net 1310.86 ml    PHYSICAL EXAMINATION:  GENERAL:  65 y.o.-year-old patient lying in the bed with no acute distress.  LUNGS: Normal breath sounds bilaterally, no wheezing, rales,rhonchi or crepitation. No use of accessory muscles of respiration.  CARDIOVASCULAR: S1, S2 normal. No murmurs, rubs, or gallops.  ABDOMEN: Soft, non-tender, non-distended. Bowel sounds present. No organomegaly or mass.  EXTREMITIES: No pedal edema, cyanosis, or clubbing.  NEUROLOGIC: non-focal PSYCHIATRIC:  patient is alert and oriented x 3.  SKIN: No obvious rash, lesion, or ulcer.   DATA REVIEW:   CBC  Recent Labs  Lab 02/20/21 0456  WBC 8.6  HGB 9.7*  HCT 31.1*  PLT 255    Chemistries  Recent Labs  Lab 02/20/21 0456  NA 136  K 4.0  CL 105  CO2 23  GLUCOSE 111*  BUN 12  CREATININE 0.88  CALCIUM 8.4*  MG 2.0    Microbiology Results   Recent Results (from the past 240 hour(s))  Resp Panel by RT-PCR (Flu A&B, Covid) Nasopharyngeal Swab     Status: None   Collection Time: 02/19/21  9:41 AM   Specimen: Nasopharyngeal Swab; Nasopharyngeal(NP) swabs in  vial transport medium  Result Value Ref Range Status   SARS Coronavirus 2 by RT PCR NEGATIVE NEGATIVE Final    Comment: (NOTE) SARS-CoV-2 target nucleic acids are NOT DETECTED.  The SARS-CoV-2 RNA is generally detectable in upper respiratory specimens during the acute phase of infection. The lowest concentration of SARS-CoV-2 viral copies this assay can detect is 138 copies/mL. A negative result does not preclude SARS-Cov-2 infection and should not be used as the sole basis for treatment or other patient management decisions. A negative result may occur with  improper specimen collection/handling, submission of specimen other than nasopharyngeal swab, presence of viral mutation(s) within the areas targeted by this assay, and inadequate number of viral copies(<138 copies/mL). A negative result must be combined with clinical observations, patient history, and epidemiological information. The expected result is Negative.  Fact Sheet for Patients:  EntrepreneurPulse.com.au  Fact Sheet  for Healthcare Providers:  IncredibleEmployment.be  This test is no t yet approved or cleared by the [place] and  has been authorized for detection and/or diagnosis of SARS-CoV-2 by FDA under an Emergency Use Authorization (EUA). This EUA will remain  in effect (meaning this test can be used) for the duration of the COVID-19 declaration under Section 564(b)(1) of the Act, 21 U.S.C.section 360bbb-3(b)(1), unless the authorization is terminated  or revoked sooner.       Influenza A by PCR NEGATIVE NEGATIVE Final   Influenza B by PCR NEGATIVE NEGATIVE Final    Comment: (NOTE) The Xpert Xpress SARS-CoV-2/FLU/RSV plus assay is intended as an aid in the diagnosis of influenza from Nasopharyngeal swab specimens and should not be used as a sole basis for treatment. Nasal washings and aspirates are unacceptable for Xpert Xpress SARS-CoV-2/FLU/RSV testing.  Fact  Sheet for Patients: EntrepreneurPulse.com.au  Fact Sheet for Healthcare Providers: IncredibleEmployment.be  This test is not yet approved or cleared by the [place] FDA and has been authorized for detection and/or diagnosis of SARS-CoV-2 by FDA under an Emergency Use Authorization (EUA). This EUA will remain in effect (meaning this test can be used) for the duration of the COVID-19 declaration under Section 564(b)(1) of the Act, 21 U.S.C. section 360bbb-3(b)(1), unless the authorization is terminated or revoked.  Performed at [facility], [address] [place], [place] [address]     RADIOLOGY:  CARDIAC CATHETERIZATION  Result Date: 02/19/2021   Dist Cx lesion is 100% stenosed.   Dist LAD lesion is 75% stenosed.   RPAV lesion is 55% stenosed.   The left ventricular systolic function is normal.   LV end diastolic pressure is normal.   The left ventricular ejection fraction is greater than 65% by visual estimate.   There is no mitral valve regurgitation. High grade lesion in LAD distally and occluded distal LCX with colaterals from RCA, normal EF. Advise PCI of LAD.   CARDIAC CATHETERIZATION  Result Date: 02/19/2021   Dist LAD lesion is 80% stenosed.   A drug-eluting stent was successfully placed using a STENT ONYX FRONTIER 2.0X22.   Post intervention, there is a 0% residual stenosis. Conclusion Successful PCI and stent of distal LAD 80% lesion Deployment of a 2.0 x22 mm Onyx frontier to 14 atm Lesion reduced from 80 down to 0% TIMI-3 flow before and after the case Minx deployed Patient was to continue Plavix and aspirin for 12 months Patient to follow-up with Dr. Yancey Flemings Anticipate discharge tomorrow     CODE STATUS:     Code Status Orders  (From admission, onward)           Start     Ordered   02/19/21 0920  Full code  Continuous        02/19/21 0919           Code Status History     Date Active Date Inactive Code  Status Order ID Comments User Context   02/19/2021 0843 02/19/2021 0919 Full Code [id number]  Ivor Costa, MD Inpatient   12/03/2020 1748 12/06/2020 1935 Full Code [id number]  Rikki Spearing, NP Inpatient   12/03/2020 1501 12/03/2020 1748 Full Code [id number]  Rikki Spearing, NP Inpatient        TOTAL TIME TAKING CARE OF THIS PATIENT: 40 minutes.    Fritzi Mandes M.D  [facility]    CC: Primary care physician; Dionisio David, MD

## 2021-02-23 ENCOUNTER — Ambulatory Visit
Admission: RE | Admit: 2021-02-23 | Discharge: 2021-02-23 | Disposition: A | Discharge status: Home / Self Care | Payer: Medicare HMO | Source: Ambulatory Visit | Attending: Physician Assistant | Admitting: Physician Assistant

## 2021-02-23 ENCOUNTER — Other Ambulatory Visit: Payer: Self-pay

## 2021-02-23 ENCOUNTER — Encounter: Payer: Self-pay | Admitting: *Deleted

## 2021-02-23 DIAGNOSIS — F17210 Nicotine dependence, cigarettes, uncomplicated: Secondary | ICD-10-CM | POA: Diagnosis not present

## 2021-02-23 DIAGNOSIS — Z9861 Coronary angioplasty status: Secondary | ICD-10-CM | POA: Diagnosis not present

## 2021-02-23 DIAGNOSIS — Z9889 Other specified postprocedural states: Secondary | ICD-10-CM | POA: Diagnosis not present

## 2021-02-23 DIAGNOSIS — E669 Obesity, unspecified: Secondary | ICD-10-CM | POA: Diagnosis not present

## 2021-02-23 DIAGNOSIS — I6389 Other cerebral infarction: Secondary | ICD-10-CM

## 2021-02-23 DIAGNOSIS — I6302 Cerebral infarction due to thrombosis of basilar artery: Secondary | ICD-10-CM | POA: Diagnosis not present

## 2021-02-23 DIAGNOSIS — I739 Peripheral vascular disease, unspecified: Secondary | ICD-10-CM | POA: Diagnosis not present

## 2021-02-23 DIAGNOSIS — E668 Other obesity: Secondary | ICD-10-CM | POA: Diagnosis not present

## 2021-02-23 DIAGNOSIS — I10 Essential (primary) hypertension: Secondary | ICD-10-CM | POA: Diagnosis not present

## 2021-02-23 DIAGNOSIS — R0602 Shortness of breath: Secondary | ICD-10-CM | POA: Diagnosis not present

## 2021-02-23 DIAGNOSIS — I639 Cerebral infarction, unspecified: Secondary | ICD-10-CM

## 2021-02-23 DIAGNOSIS — F17200 Nicotine dependence, unspecified, uncomplicated: Secondary | ICD-10-CM | POA: Diagnosis not present

## 2021-02-23 HISTORY — PX: IR RADIOLOGIST EVAL & MGMT: IMG5224

## 2021-02-23 NOTE — Progress Notes (Signed)
Chief Complaint: Neuro-vascular disease, treated with basilar artery stenting  Referring Physician(s): Dr. Quinn Axe, Stroke Neurology  Cardiology: Dr. Humphrey Rolls  History of Present Illness: Travis Williams is a 65 y.o. 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   Travis Williams joins [place] today by himself by telemedicine visit today, given COVID.  We confirmed his identify with 2 personal identifiers.   He presented 12/03/20 with right hemiplegia and slurred speech, NIHSS of 16, and CT imaging workup showing no CT evidence of infarction.  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    This lesion was treated with balloon mounted stent, 2.44m x 851mResolute Onyx, inflated to 10atm.    He was then admitted to the Neuro ICU and discharged 12/06/20 with good improvement.   Today, he tells me that he has had no recurrent or new stroke symptoms. He feels that he has recovered fully from this prior event.   He has upcoming appt with neurology.   He has, however, recently been treated for coronary disease, having received a drug-eluting stent 02/19/21, and DCDecatur2/3.  Currently he is taking DAPT with plavix and aspirin.    Past Medical History:  Diagnosis Date   Hypertension    [facility]    Past Surgical History:  Procedure Laterality Date   CORONARY STENT INTERVENTION N/A 02/19/2021   Procedure: CORONARY STENT INTERVENTION;  Surgeon: CaYolonda KidaMD;  [facility] [facility];  Service: Cardiovascular;  Laterality: N/A;   IR CT HEAD LTD  12/03/2020   IR INTRA CRAN STENT  12/03/2020   IR PERCUTANEOUS ART THROMBECTOMY/INFUSION  INTRACRANIAL INC DIAG ANGIO  12/03/2020   IR [place] VASC ACCESS RIGHT  12/03/2020   LEFT HEART CATH AND CORONARY ANGIOGRAPHY N/A 02/19/2021   Procedure: LEFT HEART CATH AND CORONARY ANGIOGRAPHY with intervention;  Surgeon: KhDionisio DavidMD;  [facility];  Service: Cardiovascular;  Laterality: N/A;   RADIOLOGY WITH ANESTHESIA N/A 12/03/2020   Procedure: IR WITH ANESTHESIA;  Surgeon: Radiologist, Medication, MD;  [facility]: Radiology;  Laterality: N/A;    Allergies: Patient has no known allergies.  Medications: Prior to Admission medications   Medication Sig Start Date End Date Taking? Authorizing Provider  amLODipine (NORVASC) 10 MG tablet Take 1 tablet (10 mg total) by mouth daily. 12/07/20   Bailey-Modzik, DeMayo Ao, NP  aspirin 81 MG chewable tablet Chew 1 tablet (81 mg total) by mouth daily. 12/07/20   Bailey-Modzik, Delila A, NP  atorvastatin (LIPITOR) 40 MG tablet Take 1 tablet (40 mg total) by mouth daily. 12/07/20   Bailey-Modzik, Delila A, NP  cilostazol (PLETAL) 100 MG tablet Take 100 mg by mouth 2 (two) times daily.    [provider]  clopidogrel (PLAVIX) 75 MG tablet Take 1 tablet (75 mg total) by mouth daily with breakfast. 02/21/21   PaFritzi MandesMD  labetalol (NORMODYNE) 300 MG tablet Take 300 mg by mouth 2 (two) times daily.    [provider]  lisinopril (ZESTRIL) 10 MG tablet Take 1 tablet (10 mg total) by mouth daily. 12/07/20   Bailey-Modzik, Delila A, NP  nitroGLYCERIN (NITROSTAT) 0.4 MG  SL tablet Place 1 tablet (0.4 mg total) under the tongue every 5 (five) minutes as needed for chest pain. 02/20/21   Fritzi Mandes, MD     Family History  Problem Relation Age of Onset   Hypertension Brother     Social History   Socioeconomic History   Marital status: Single    Spouse name: Not on file   Number of children: Not on file   Years of education: Not on file   Highest education level: Not on file  Occupational History    Not on file  Tobacco Use   Smoking status: Former    Packs/day: 1.00    Years: 5.00    Pack years: 5.00    Types: Cigarettes    Quit date: 72    Years since quitting: 25.9   Smokeless tobacco: Never  Vaping Use   Vaping Use: Never used  Substance and Sexual Activity   Alcohol use: Not Currently   Drug use: Never   Sexual activity: Not on file  Other Topics Concern   Not on file  Social History Narrative   Not on file   Social Determinants of Health   Financial Resource Strain: Not on file  Food Insecurity: No Food Insecurity   Worried About Charity fundraiser in the Last Year: Never true   Mutual in the Last Year: Never true  Transportation Needs: No Transportation Needs   Lack of Transportation (Medical): No   Lack of Transportation (Non-Medical): No  Physical Activity: Not on file  Stress: Not on file  Social Connections: Not on file       Review of Systems  Review of Systems: A 12 point ROS discussed and pertinent positives are indicated in the HPI above.  All other systems are negative.  Physical Exam No direct physical exam was performed (except for noted visual exam findings with Video Visits).   Vital Signs: There were no vitals taken for this visit.  Imaging: CARDIAC CATHETERIZATION  Result Date: 02/19/2021   Dist Cx lesion is 100% stenosed.   Dist LAD lesion is 75% stenosed.   RPAV lesion is 55% stenosed.   The left ventricular systolic function is normal.   LV end diastolic pressure is normal.   The left ventricular ejection fraction is greater than 65% by visual estimate.   There is no mitral valve regurgitation. High grade lesion in LAD distally and occluded distal LCX with colaterals from RCA, normal EF. Advise PCI of LAD.   CARDIAC CATHETERIZATION  Result Date: 02/19/2021   Dist LAD lesion is 80% stenosed.   A drug-eluting stent was successfully placed using a STENT ONYX FRONTIER 2.0X22.   Post intervention, there is a 0% residual  stenosis. Conclusion Successful PCI and stent of distal LAD 80% lesion Deployment of a 2.0 x22 mm Onyx frontier to 14 atm Lesion reduced from 80 down to 0% TIMI-3 flow before and after the case Minx deployed Patient was to continue Plavix and aspirin for 12 months Patient to follow-up with Dr. Yancey Flemings Anticipate discharge tomorrow    Labs:  CBC: Recent Labs    12/05/20 0543 12/06/20 0317 02/19/21 1244 02/20/21 0456  WBC 9.5 12.6* 7.9 8.6  HGB 11.0* 11.7* 10.1* 9.7*  HCT 34.1* 35.6* 31.8* 31.1*  PLT 220 229 251 255    COAGS: Recent Labs    12/03/20 1345 12/03/20 1753  INR 1.0 1.0  APTT 20* 29    BMP: Recent Labs  12/05/20 0543 12/06/20 0317 02/19/21 1244 02/20/21 0456  NA 136 135 137 136  K 3.2* 3.8 3.4* 4.0  CL 99 99 107 105  CO2 26 25 25 23   GLUCOSE 159* 128* 148* 111*  BUN 13 12 12 12   CALCIUM 8.2* 8.6* 8.3* 8.4*  CREATININE 0.93 0.93 0.87 0.88  GFRNONAA >60 >60 >60 >60    LIVER FUNCTION TESTS: Recent Labs    12/03/20 1345 12/04/20 0516  BILITOT 0.6 2.6*  AST 30 RESULTS UNAVAILABLE DUE TO INTERFERING SUBSTANCE  ALT 25 RESULTS UNAVAILABLE DUE TO INTERFERING SUBSTANCE  ALKPHOS 72 59  PROT 7.3 RESULTS UNAVAILABLE DUE TO INTERFERING SUBSTANCE  ALBUMIN 3.9 3.2*    TUMOR MARKERS: No results for input(s): AFPTM, CEA, CA199, CHROMGRNA in the last 8760 hours.  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    I did review with him the need for maximal medical therapy, endorsed by the American Heart Association.   Recommendations from American Heart Association (AHA Guidelines, Kleindorfer) were emphasized, including: Lifestyle modification (reduce sodium, physical activity/weight reduction, limit ETOH/illicit substances), Treatment of hypertension (targeting =/< 130-140/90); High-intensity statin therapy  (targeting LDL < 100 (optimally < 70); Anti-platelet therapy.    I did let him know, this same intensive medical strategy is endorsed for his known PAD and CAD.     Finally, I let him know that surveillance of the basilar artery stent is necessary, particularly in the first 12 months.  The best test for evaluating the stent is cerebral angiogram, which is reasonable to perform at about 6 months out.  He understands   Plan: -       Continue intensive medical management, as above -       Plan for 4 vessel cervical/cerebral angiogram with Dr. Earleen Newport at [facility] at 6 months, which is Feb/March time frame.   - I have encourage him to follow up with neurology team      ___________________________________________________________________________________________________________________   1 -  Rationale for IMM - [place], et al. PMID: [id number] 2 - AHA Guidelines, Kleindorfer DO, et al. PMID: [id number]    Electronically Signed: Corrie Mckusick 02/23/2021, 3:11 PM   I spent a total of    25 Minutes in remote  clinical consultation, greater than 50% of which was counseling/coordinating care for basilar artery atherosclerosis, acute thrombosis/stroke, SP basilar artery stenting.    Visit type: Audio only (telephone). Audio (no video) only due to patient's lack of internet/smartphone capability. Alternative for in-person consultation at [facility], [address], [place], [place]. This visit type was conducted due to national recommendations for restrictions regarding the COVID-19 Pandemic (e.g. social distancing).  This format is felt to be most appropriate for this patient at this time.  All issues noted in this document were discussed and addressed.

## 2021-03-04 ENCOUNTER — Ambulatory Visit: Payer: Medicare HMO | Admitting: Neurology

## 2021-03-04 ENCOUNTER — Encounter: Payer: Self-pay | Admitting: Neurology

## 2021-03-04 VITALS — BP 118/66 | HR 93 | Ht 66.0 in | Wt 213.5 lb

## 2021-03-04 DIAGNOSIS — Z9189 Other specified personal risk factors, not elsewhere classified: Secondary | ICD-10-CM

## 2021-03-04 DIAGNOSIS — E7849 Other hyperlipidemia: Secondary | ICD-10-CM

## 2021-03-04 DIAGNOSIS — I651 Occlusion and stenosis of basilar artery: Secondary | ICD-10-CM

## 2021-03-04 DIAGNOSIS — I6501 Occlusion and stenosis of right vertebral artery: Secondary | ICD-10-CM

## 2021-03-04 NOTE — Patient Instructions (Signed)
I had a long d/w patient about his recent stroke, basilar and vertebral artery occlusion, s/p thrombectomy and stenting,risk for recurrent stroke/TIAs, personally independently reviewed imaging studies and stroke evaluation results and answered questions.Continue aspirin 81 mg daily and Plavixl 75 mg daily  for secondary stroke prevention and maintain strict control of hypertension with blood pressure goal below 130/90, diabetes with hemoglobin A1c goal below 6.5% and lipids with LDL cholesterol goal below 70 mg/dL. I also advised the patient to eat a healthy diet with plenty of whole grains, cereals, fruits and vegetables, exercise regularly and maintain ideal body weight .I have complemented him on quitting smoking and alcohol and encouraged to continue to do so.  I also recommend polysomnogram for screening for obstructive sleep apnea.  Check screening lipid profile today.  Followup in the future with me in 6 months or call earlier if necessary. Stroke Prevention Some medical conditions and behaviors can lead to a higher chance of having a stroke. You can help prevent a stroke by eating healthy, exercising, not smoking, and managing any medical conditions you have. Stroke is a leading cause of functional impairment. Primary prevention is particularly important because a majority of strokes are first-time events. Stroke changes the lives of not only those who experience a stroke but also their family and other caregivers. How can this condition affect me? A stroke is a medical emergency and should be treated right away. A stroke can lead to brain damage and can sometimes be life-threatening. If a person gets medical treatment right away, there is a better chance of surviving and recovering from a stroke. What can increase my risk? The following medical conditions may increase your risk of a stroke: Cardiovascular disease. High blood pressure (hypertension). Diabetes. High cholesterol. Sickle cell  disease. Blood clotting disorders (hypercoagulable state). Obesity. Sleep disorders (obstructive sleep apnea). Other risk factors include: Being older than age 39. Having a history of blood clots, stroke, or mini-stroke (transient ischemic attack, TIA). Genetic factors, such as race, ethnicity, or a family history of stroke. Smoking cigarettes or using other tobacco products. Taking birth control pills, especially if you also use tobacco. Heavy use of alcohol or drugs, especially cocaine and methamphetamine. Physical inactivity. What actions can I take to prevent this? Manage your health conditions High cholesterol levels. Eating a healthy diet is important for preventing high cholesterol. If cholesterol cannot be managed through diet alone, you may need to take medicines. Take any prescribed medicines to control your cholesterol as told by your health care provider. Hypertension. To reduce your risk of stroke, try to keep your blood pressure below 130/80. Eating a healthy diet and exercising regularly are important for controlling blood pressure. If these steps are not enough to manage your blood pressure, you may need to take medicines. Take any prescribed medicines to control hypertension as told by your health care provider. Ask your health care provider if you should monitor your blood pressure at home. Have your blood pressure checked every year, even if your blood pressure is normal. Blood pressure increases with age and some medical conditions. Diabetes. Eating a healthy diet and exercising regularly are important parts of managing your blood sugar (glucose). If your blood sugar cannot be managed through diet and exercise, you may need to take medicines. Take any prescribed medicines to control your diabetes as told by your health care provider. Get evaluated for obstructive sleep apnea. Talk to your health care provider about getting a sleep evaluation if you snore a lot  or have  excessive sleepiness. Make sure that any other medical conditions you have, such as atrial fibrillation or atherosclerosis, are managed. Nutrition Follow instructions from your health care provider about what to eat or drink to help manage your health condition. These instructions may include: Reducing your daily calorie intake. Limiting how much salt (sodium) you use to 1,500 milligrams (mg) each day. Using only healthy fats for cooking, such as olive oil, canola oil, or sunflower oil. Eating healthy foods. You can do this by: Choosing foods that are high in fiber, such as whole grains, and fresh fruits and vegetables. Eating at least 5 servings of fruits and vegetables a day. Try to fill one-half of your plate with fruits and vegetables at each meal. Choosing lean protein foods, such as lean cuts of meat, poultry without skin, fish, tofu, beans, and nuts. Eating low-fat dairy products. Avoiding foods that are high in sodium. This can help lower blood pressure. Avoiding foods that have saturated fat, trans fat, and cholesterol. This can help prevent high cholesterol. Avoiding processed and prepared foods. Counting your daily carbohydrate intake.  Lifestyle If you drink alcohol: Limit how much you have to: 0-1 drink a day for women who are not pregnant. 0-2 drinks a day for men. Know how much alcohol is in your drink. In the [place] one drink equals one 12 oz bottle of beer ( ), one 5 oz glass of wine ( ), or one 1 oz glass of hard liquor (18mL). Do not use any products that contain nicotine or tobacco. These products include cigarettes, chewing tobacco, and vaping devices, such as e-cigarettes. If you need help quitting, ask your health care provider. Avoid secondhand smoke. Do not use drugs. Activity  Try to stay at a healthy weight. Get at least 30 minutes of exercise on most days, such as: Fast walking. Biking. Swimming. Medicines Take over-the-counter and prescription  medicines only as told by your health care provider. Aspirin or blood thinners (antiplatelets or anticoagulants) may be recommended to reduce your risk of forming blood clots that can lead to stroke. Avoid taking birth control pills. Talk to your health care provider about the risks of taking birth control pills if: You are over 82 years old. You smoke. You get very bad headaches. You have had a blood clot. Where to find more information American Stroke Association: www.strokeassociation.org Get help right away if: You or a loved one has any symptoms of a stroke. "BE FAST" is an easy way to remember the main warning signs of a stroke: B - Balance. Signs are dizziness, sudden trouble walking, or loss of balance. E - Eyes. Signs are trouble seeing or a sudden change in vision. F - Face. Signs are sudden weakness or numbness of the face, or the face or eyelid drooping on one side. A - Arms. Signs are weakness or numbness in an arm. This happens suddenly and usually on one side of the body. S - Speech. Signs are sudden trouble speaking, slurred speech, or trouble understanding what people say. T - Time. Time to call emergency services. Write down what time symptoms started. You or a loved one has other signs of a stroke, such as: A sudden, severe headache with no known cause. Nausea or vomiting. Seizure. These symptoms may represent a serious problem that is an emergency. Do not wait to see if the symptoms will go away. Get medical help right away. Call your local emergency services (911 in the [place] Do not drive yourself to  the hospital. Summary You can help to prevent a stroke by eating healthy, exercising, not smoking, limiting alcohol intake, and managing any medical conditions you may have. Do not use any products that contain nicotine or tobacco. These include cigarettes, chewing tobacco, and vaping devices, such as e-cigarettes. If you need help quitting, ask your health care  provider. Remember "BE FAST" for warning signs of a stroke. Get help right away if you or a loved one has any of these signs. This information is not intended to replace advice given to you by your health care provider. Make sure you discuss any questions you have with your health care provider. Document Revised: 10/07/2019 Document Reviewed: 10/07/2019 Elsevier Patient Education  2022 [employer].

## 2021-03-04 NOTE — Progress Notes (Signed)
[facility] [address] [place]. [place] [address] [phone]       OFFICE FOLLOW-UP NOTE  [name] Date of Birth:  05/29/1955 Medical Record Number:  [id number]   HPI: [name] is a 65 year old African-American male seen today for initial office follow-up visit following hospital admission for stroke in September 2022.  History is obtained from the patient and review of electronic medical records and opossum reviewed pertinent available imaging films in PACS.Travis Williams is a 65 y.o. male who only has PMH known of HTN.  He presented to [facility] 12/03/20 with R hemiplegia and slurred speech. NIHSS 16. Last known well at 1030 and found on floor at 1230CT head was negative. 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 He was given TNKase and then required intubation. 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 IR team, Dr. Earleen Newport, emergently consulted and patient was emergently taken to IR and underwent successful mechanical thrombectomy with rescue stent placement.  He was admitted to the ICU blood pressure was tightly controlled.  He was extubated and did well.  MRI scan showed scattered bilateral punctate cerebellar, brainstem and occipital lobe infarcts.  2D echo showed ejection fraction of 70 to 75%.  Cardiac monitoring did not show significant arrhythmias.  LDL cholesterol 136 mg percent.  Hemoglobin A1c was 5.7.  He was started on aspirin and Brilinta.  His only mild deficits included dysarthria and right facial droop.  He was discharged home and has done well.  His speech is recovered completely back to normal and he is tolerating aspirin and Plavix now as he was unable to afford Brilinta.  He has minor bruising but no bleeding.  He has quit smoking completely as well as alcohol.  His blood pressure is under good control today it is 118/86 6.  He is tolerating Lipitor but does complain of some muscle aches.  He has not  had any follow-up lipid profile checked.  He is able to ambulate independently without assistance.  He feels he is neurologically back to his baseline and has no deficits remaining from his stroke.  He has no new complaints today.  ROS:   14 system review of systems is positive for dysarthria, slurred speech, weakness, hemiplegia, bruising, myalgias all other systems negative  PMH:  Past Medical History:  Diagnosis Date   Hypertension    Stroke [facility])     Social History:  Social History   Socioeconomic History   Marital status: Single    Spouse name: Not on file   Number of children: Not on file   Years of education: Not on file   Highest education level: Not on file  Occupational History   Not on file  Tobacco Use   Smoking status: Former    Packs/day: 1.00    Years: 5.00    Pack years: 5.00    Types: Cigarettes    Quit date: 67    Years since quitting: 25.9   Smokeless tobacco: Never  Vaping Use   Vaping Use: Never used  Substance and Sexual Activity   Alcohol use: Not Currently   Drug use: Never   Sexual activity: Not on file  Other Topics Concern   Not on file  Social History Narrative   Not on file   Social Determinants of Health   Financial Resource Strain: Not on file  Food Insecurity: No Food Insecurity   Worried About [place] in the Last Year:  Never true   Ran Out of Food in the Last Year: Never true  Transportation Needs: No Transportation Needs   Lack of Transportation (Medical): No   Lack of Transportation (Non-Medical): No  Physical Activity: Not on file  Stress: Not on file  Social Connections: Not on file  Intimate Partner Violence: Not on file    Medications:   Current Outpatient Medications on File Prior to Visit  Medication Sig Dispense Refill   amLODipine (NORVASC) 10 MG tablet Take 1 tablet (10 mg total) by mouth daily. 30 tablet 1   aspirin 81 MG chewable tablet Chew 1 tablet (81 mg total) by mouth daily.      atorvastatin (LIPITOR) 40 MG tablet Take 1 tablet (40 mg total) by mouth daily. 30 tablet 1   cilostazol (PLETAL) 100 MG tablet Take 100 mg by mouth 2 (two) times daily.     clopidogrel (PLAVIX) 75 MG tablet Take 1 tablet (75 mg total) by mouth daily with breakfast. 30 tablet 2   labetalol (NORMODYNE) 300 MG tablet Take 300 mg by mouth 2 (two) times daily.     lisinopril (ZESTRIL) 10 MG tablet Take 1 tablet (10 mg total) by mouth daily. 30 tablet 0   nitroGLYCERIN (NITROSTAT) 0.4 MG SL tablet Place 1 tablet (0.4 mg total) under the tongue every 5 (five) minutes as needed for chest pain. 20 tablet 12   Current Facility-Administered Medications on File Prior to Visit  Medication Dose Route Frequency Provider Last Rate Last Admin   sodium chloride flush (NS) 0.9 % injection 3 mL  3 mL Intravenous Q12H Laurier Nancy, MD        Allergies:  No Known Allergies  Physical Exam General: Mildly obese middle-aged African-American male, seated, in no evident distress Head: head normocephalic and atraumatic.  Neck: supple with no carotid or supraclavicular bruits Cardiovascular: regular rate and rhythm, no murmurs Musculoskeletal: no deformity Skin:  no rash/petichiae Vascular:  Normal pulses all extremities Vitals:   03/04/21 0945  BP: 118/66  Pulse: 93   Neurologic Exam Mental Status: Awake and fully alert. Oriented to place and time. Recent and remote memory intact. Attention span, concentration and fund of knowledge appropriate. Mood and affect appropriate.  Diminished recall 2/3.  Able to name 11 animals which can walk on 4 legs.  Clock drawing 4/4. Cranial Nerves: Fundoscopic exam reveals sharp disc margins. Pupils equal, briskly reactive to light. Extraocular movements full without nystagmus. Visual fields full to confrontation. Hearing intact. Facial sensation intact. Face, tongue, palate moves normally and symmetrically.  Motor: Normal bulk and tone. Normal strength in all tested extremity  muscles. Sensory.: intact to touch ,pinprick .position and vibratory sensation.  Coordination: Rapid alternating movements normal in all extremities. Finger-to-nose and heel-to-shin performed accurately bilaterally. Gait and Station: Arises from chair without difficulty. Stance is normal. Gait demonstrates normal stride length and balance . Able to heel, toe and tandem walk with only mild difficulty.  Reflexes: 1+ and symmetric. Toes downgoing.   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  Vascular risk factors of hypertension, hyperlipidemia, smoking and intracranial stenosis.  Patient has done amazingly well with practically no residual deficits or symptoms.     PLAN: I had a long d/w patient about his recent stroke, basilar and vertebral artery occlusion, s/p thrombectomy and  stenting,risk for recurrent stroke/TIAs, personally independently reviewed imaging studies and stroke evaluation results and answered questions.Continue aspirin 81 mg daily and Plavixl 75 mg daily  for secondary stroke prevention and maintain strict control of hypertension with blood pressure goal below 130/90, diabetes with hemoglobin A1c goal below 6.5% and lipids with LDL cholesterol goal below 70 mg/dL. I also advised the patient to eat a healthy diet with plenty of whole grains, cereals, fruits and vegetables, exercise regularly and maintain ideal body weight .I have complemented him on quitting smoking and alcohol and encouraged to continue to do so.  I also recommend polysomnogram for screening for obstructive sleep apnea.  Check screening lipid profile today.  Followup in the future with me in 6 months or call earlier if necessary. Greater than 50% of time during this 30 minute visit was  spent on counseling,explanation of diagnosis, planning of further management, discussion with patient and family and coordination of care Antony Contras, MD Note: This document was prepared with digital dictation and possible smart phrase technology. Any transcriptional errors that result from this process are unintentional

## 2021-03-05 LAB — LIPID PANEL
Chol/HDL Ratio: 3.4 ratio (ref 0.0–5.0)
Cholesterol, Total: 118 mg/dL (ref 100–199)
HDL: 35 mg/dL — ABNORMAL LOW (ref >39)
LDL Chol Calc (NIH): 64 mg/dL (ref 0–99)
Triglycerides: 101 mg/dL (ref 0–149)
VLDL Cholesterol Cal: 19 mg/dL (ref 5–40)

## 2021-03-08 ENCOUNTER — Telehealth: Payer: Self-pay

## 2021-03-08 ENCOUNTER — Other Ambulatory Visit: Payer: Self-pay

## 2021-03-08 ENCOUNTER — Telehealth: Payer: Self-pay | Admitting: *Deleted

## 2021-03-08 NOTE — Telephone Encounter (Signed)
Spoke with patient and informed of lab results. Pt verbalized understanding and expressed appreciation for the call. All questions answered.

## 2021-03-08 NOTE — Patient Outreach (Signed)
[facility]) Care Management  03/08/2021  Travis Williams 07/09/1955 [id number]   No telephone outreach to patient to obtain mRS. mRS was successfully completed by Dr. Pearlean Brownie on 03/04/21. MRS= 1  Vanice Sarah [facility] Care Management Assistant

## 2021-03-08 NOTE — Telephone Encounter (Signed)
-----   Message from Micki Riley, MD sent at 03/07/2021  3:59 PM EST ----- Kindly inform the patient that cholesterol profile was satisfactory ----- Message ----- From: Interface, [facility] Lab Results In Sent: 03/05/2021   5:36 AM EST To: Micki Riley, MD

## 2021-03-24 ENCOUNTER — Encounter: Payer: Medicare HMO | Attending: Cardiovascular Disease

## 2021-04-01 ENCOUNTER — Other Ambulatory Visit: Payer: Self-pay

## 2021-04-01 ENCOUNTER — Ambulatory Visit: Payer: Self-pay

## 2021-04-01 NOTE — Patient Outreach (Addendum)
[facility]) Care Management  04/01/2021  Travis Williams 01/26/1956 [id number]   Telephone Assessment    Outreach attempt #1 to patient. No answer. RN CM left HIPAA compliant voicemail message along with contact info.     Plan: Assigned RN CM will make outreach attempt to patient within the month of Feb if no return call from patient.   Antionette Fairy, RN,BSN,CCM [facility] Care Management Telephonic Care Management Coordinator Direct Phone: [phone] Toll Free: [phone] Fax: [phone]

## 2021-05-06 ENCOUNTER — Other Ambulatory Visit: Payer: Self-pay

## 2021-05-06 NOTE — Patient Outreach (Signed)
[facility]) Care Management  05/06/2021  Travis Williams [birth year] [id number]   Telephone Assessment    Unsuccessful outreach attempt to patient.    Plan: Assigned RN CM will make outreach attempt to patient within the month of April if no return call.    Antionette Fairy, RN,BSN,CCM [facility] Care Management Telephonic Care Management Coordinator Direct Phone: [phone] Toll Free: [phone] Fax: [phone]

## 2021-05-10 ENCOUNTER — Ambulatory Visit: Payer: Medicare HMO

## 2021-05-27 ENCOUNTER — Institutional Professional Consult (permissible substitution): Payer: Medicare HMO | Admitting: Neurology

## 2021-06-02 DIAGNOSIS — E119 Type 2 diabetes mellitus without complications: Secondary | ICD-10-CM | POA: Diagnosis not present

## 2021-06-21 DIAGNOSIS — E669 Obesity, unspecified: Secondary | ICD-10-CM | POA: Diagnosis not present

## 2021-06-21 DIAGNOSIS — E668 Other obesity: Secondary | ICD-10-CM | POA: Diagnosis not present

## 2021-06-21 DIAGNOSIS — I6389 Other cerebral infarction: Secondary | ICD-10-CM | POA: Diagnosis not present

## 2021-06-21 DIAGNOSIS — E782 Mixed hyperlipidemia: Secondary | ICD-10-CM | POA: Diagnosis not present

## 2021-06-21 DIAGNOSIS — F17210 Nicotine dependence, cigarettes, uncomplicated: Secondary | ICD-10-CM | POA: Diagnosis not present

## 2021-06-21 DIAGNOSIS — R0602 Shortness of breath: Secondary | ICD-10-CM | POA: Diagnosis not present

## 2021-06-21 DIAGNOSIS — I739 Peripheral vascular disease, unspecified: Secondary | ICD-10-CM | POA: Diagnosis not present

## 2021-06-21 DIAGNOSIS — I2510 Atherosclerotic heart disease of native coronary artery without angina pectoris: Secondary | ICD-10-CM | POA: Diagnosis not present

## 2021-06-21 DIAGNOSIS — I10 Essential (primary) hypertension: Secondary | ICD-10-CM | POA: Diagnosis not present

## 2021-06-22 ENCOUNTER — Encounter: Payer: Self-pay | Admitting: Neurology

## 2021-06-22 ENCOUNTER — Ambulatory Visit: Payer: Medicare HMO | Admitting: Neurology

## 2021-06-22 VITALS — BP 155/83 | HR 73 | Ht 66.0 in | Wt 222.0 lb

## 2021-06-22 DIAGNOSIS — I25110 Atherosclerotic heart disease of native coronary artery with unstable angina pectoris: Secondary | ICD-10-CM

## 2021-06-22 DIAGNOSIS — G4733 Obstructive sleep apnea (adult) (pediatric): Secondary | ICD-10-CM | POA: Insufficient documentation

## 2021-06-22 DIAGNOSIS — R0683 Snoring: Secondary | ICD-10-CM

## 2021-06-22 DIAGNOSIS — G4734 Idiopathic sleep related nonobstructive alveolar hypoventilation: Secondary | ICD-10-CM

## 2021-06-22 DIAGNOSIS — I651 Occlusion and stenosis of basilar artery: Secondary | ICD-10-CM

## 2021-06-22 DIAGNOSIS — G4719 Other hypersomnia: Secondary | ICD-10-CM

## 2021-06-22 NOTE — Progress Notes (Signed)
? ? ?SLEEP MEDICINE CLINIC ?  ? ?Provider:  Melvyn Novas, MD  ?Primary Care Physician:  Laurier Nancy, MD ?[address] ?[place] [address]  ? ?  ?Referring Provider: Micki Riley, Md ?[address] ?[address] ?[place],  [place] [address]  ?  ?  ?    ?Chief Complaint according to patient   ?Patient presents with:  ?  ? New Patient (Initial Visit)  ?   Follow up with Micki Riley, MD (Neurology) in 4 weeks (01/03/2021) ?Follow up with [facility] in 1 week (12/13/2020); check on secondary stroke prevention and pneumonia  ?  ?  ?HISTORY OF PRESENT ILLNESS:  4-4- 2023:  ?Christie Copley is a 66 y.o.  African American male patient seen upon referral by dr Pearlean Brownie, MD , on 06/22/2021 . ?Chief concern according to patient :  Stroke in 11-2020, [facility]. 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  Also unstable angina.  ?   ?The patient never had a sleep study .  ?  ?Sleep relevant medical history: Nocturia 2-4 times, snoring loudly all his life, CVA and angina. HTN .  ? Family medical /sleep history: No other family member on CPAP with OSA.  ?  ?Social history:  Patient is  retired from [employer], academic and lives in a household with GF,  is divorced-  2 adult children, 7 grandchildren.  ?The patient currently works/ used to work in shifts( Chief Technology Officer,) ?Tobacco use; remotely .   ?ETOH use - none since his stroke. ? Caffeine intake in form of Soda( mountain dew -  quit with the stroke) Tea ( /) or energy drinks. ?  ?  ?Sleep habits are as follows: The patient's dinner time is between 8 PM. The patient plays on his computer, watches TV , then goes to bed at 2 AM and that is a life long habit, used to work second shift all his working life. continues to sleep for less than 5 hours, wakes for many bathroom breaks. ?The preferred sleep position is laterally, with the support of 2 pillows. Flat bed. Dreams are reportedly rare. ?  7  AM is the usual rise time. The patient's dog wakes him up. ? ?He reports feeling refreshed /restored in AM, with symptoms such as dry mouth, residual fatigue.  ?Naps are taken infrequently, but he dozes off-lasting from 10 to 15 minutes and are  refreshing . ?  ?Review of Systems: ?Out of a complete 14 system review, the patient complains of only the following symptoms, and all other reviewed systems are negative.:  ?Fatigue, sleepiness , snoring, fragmented sleep, Nocturia, short sleeper, all his life  ?  ?How likely are you to doze in the following situations: ?0 = not likely, 1 = slight chance, 2 = moderate chance, 3 = high chance ?  ?Sitting and Reading? ?Watching Television? ?Sitting inactive in a public place (theater or meeting)? ?As a passenger in a car for an hour without a break? ?Lying down in the afternoon when circumstances permit? ?Sitting and talking to someone? ?Sitting quietly after lunch without alcohol? ?In a car, while stopped for a few minutes in traffic? ?  ?Total = 14/ 24 points  ? FSS endorsed at 42/ 63 points.  ? ?Social History  ? ?Socioeconomic History  ? Marital status: Single  ?  Spouse name: Not on file  ? Number of children: 2  ? Years of  education: Not on file  ? Highest education level: High school graduate  ?Occupational History  ? Not on file  ?Tobacco Use  ? Smoking status: Former  ?  Packs/day: 1.00  ?  Years: 5.00  ?  Pack years: 5.00  ?  Types: Cigarettes  ?  Quit date: 1997  ?  Years since quitting: 26.2  ? Smokeless tobacco: Never  ?Vaping Use  ? Vaping Use: Never used  ?Substance and Sexual Activity  ? Alcohol use: Not Currently  ? Drug use: Never  ? Sexual activity: Not on file  ?Other Topics Concern  ? Not on file  ?Social History Narrative  ? Lives w roommate friend  ? Right handed  ? Caffeine: energy drink once a week  ? ?Social Determinants of Health  ? ?Financial Resource Strain: Not on file  ?Food Insecurity: No Food Insecurity  ? Worried About Programme researcher, broadcasting/film/videounning Out of Food in  the Last Year: Never true  ? Ran Out of Food in the Last Year: Never true  ?Transportation Needs: No Transportation Needs  ? Lack of Transportation (Medical): No  ? Lack of Transportation (Non-Medical): No  ?Physical Activity: Not on file  ?Stress: Not on file  ?Social Connections: Not on file  ? ? ?Family History  ?Problem Relation Age of Onset  ? Hypertension Brother   ? ? ?Past Medical History:  ?Diagnosis Date  ? Hypertension   ? Stroke [facility](HCC)   ? ? ?Past Surgical History:  ?Procedure Laterality Date  ? CORONARY STENT INTERVENTION N/A 02/19/2021  ? Procedure: CORONARY STENT INTERVENTION;  Surgeon: Alwyn Peaallwood, Dwayne D, MD;  Location: [facility] INVASIVE CV LAB;  Service: Cardiovascular;  Laterality: N/A;  ? IR CT HEAD LTD  12/03/2020  ? IR INTRA CRAN STENT  12/03/2020  ? IR PERCUTANEOUS ART THROMBECTOMY/INFUSION INTRACRANIAL INC DIAG ANGIO  12/03/2020  ? IR RADIOLOGIST EVAL & MGMT  02/23/2021  ? IR US GUIDE VASC ACCESS RIGHT  12/03/2020  ? LEFT HEART CATH AND CORONARY ANGIOGRAPHY N/A 02/19/2021  ? Procedure: LEFT HEART CATH AND CORONARY ANGIOGRAPHY with intervention;  Surgeon: Laurier NancyKhan, Shaukat A, MD;  Location: [facility] INVASIVE CV LAB;  Service: Cardiovascular;  Laterality: N/A;  ? RADIOLOGY WITH ANESTHESIA N/A 12/03/2020  ? Procedure: IR WITH ANESTHESIA;  Surgeon: Radiologist, Medication, MD;  Location: [facility] OR;  Service: Radiology;  Laterality: N/A;  ?  ? ?Current Outpatient Medications on File Prior to Visit  ?Medication Sig Dispense Refill  ? amLODipine (NORVASC) 10 MG tablet Take 1 tablet (10 mg total) by mouth daily. 30 tablet 1  ? aspirin 81 MG chewable tablet Chew 1 tablet (81 mg total) by mouth daily.    ? atorvastatin (LIPITOR) 40 MG tablet Take 1 tablet (40 mg total) by mouth daily. 30 tablet 1  ? cilostazol (PLETAL) 100 MG tablet Take 100 mg by mouth 2 (two) times daily.    ? clopidogrel (PLAVIX) 75 MG tablet Take 1 tablet (75 mg total) by mouth daily with breakfast. 30 tablet 2  ? labetalol (NORMODYNE) 300 MG tablet Take  300 mg by mouth 2 (two) times daily.    ? lisinopril (ZESTRIL) 10 MG tablet Take 1 tablet (10 mg total) by mouth daily. 30 tablet 0  ? nitroGLYCERIN (NITROSTAT) 0.4 MG SL tablet Place 1 tablet (0.4 mg total) under the tongue every 5 (five) minutes as needed for chest pain. 20 tablet 12  ? ?Current Facility-Administered Medications on File Prior to Visit  ?Medication Dose Route Frequency Provider Last Rate  Last Admin  ? sodium chloride flush (NS) 0.9 % injection 3 mL  3 mL Intravenous Q12H Laurier Nancy, MD      ? ? ?No Known Allergies ? ?Physical exam: ? ?Today's Vitals  ? 06/22/21 1129  ?BP: (!) 155/83  ?Pulse: 73  ?Weight: 222 lb (100.7 kg)  ?Height: 5\' 6"  (1.676 m)  ? ?Body mass index is 35.83 kg/m?.  ? ?Wt Readings from Last 3 Encounters:  ?06/22/21 222 lb (100.7 kg)  ?03/04/21 213 lb 8 oz (96.8 kg)  ?02/19/21 208 lb 14.4 oz (94.8 kg)  ?  ? ?Ht Readings from Last 3 Encounters:  ?06/22/21 5\' 6"  (1.676 m)  ?03/04/21 5\' 6"  (1.676 m)  ?02/19/21 5\' 6"  (1.676 m)  ?  ?  ?General: The patient is awake, alert and appears not in acute distress. The patient is well groomed. ?Head: Normocephalic, atraumatic. Neck is supple. Mallampati 3,  ?neck circumference:18 inches . Nasal airflow feels restricted , but is  patent.  Retrognathia is seen.  ?Dental status: irregular  ?Cardiovascular:  Regular rate and cardiac rhythm by pulse,  without distended neck veins. ?Respiratory: Lungs are clear to auscultation.  ?Skin:  Without evidence of ankle edema, or rash. ?Trunk: The patient's posture is erect. ?  ?Neurologic exam : ?The patient is awake and alert, oriented to place and time.   ?Memory subjective described as intact.  ?Attention span & concentration ability appears normal.  ?Speech is fluent,  without  dysarthria, dysphonia or aphasia.  ?Mood and affect are appropriate. ?  ?Cranial nerves: no loss of smell or taste reported  ?Pupils are equal and briskly reactive to light. Funduscopic exam deferred. 03/06/21  ?Extraocular  movements in vertical and horizontal planes were intact and without nystagmus. No Diplopia. ?Visual fields by finger perimetry are intact. Reports floaters. ?Hearing was intact to soft voice and finger rubbing.

## 2021-06-30 ENCOUNTER — Other Ambulatory Visit: Payer: Self-pay

## 2021-06-30 NOTE — Patient Instructions (Signed)
atient Goals/Self-Care Activities: Hypertension ?Patient will self administer medications as prescribed ?Patient will attend all scheduled provider appointments ?Limit salt intake ?Remain as active as possible ?Eat plenty of fruits and vegetables ?Take blood pressure at least weekly at home about one hour after medication and record in a notebook to take to doctor's visits.  ?

## 2021-06-30 NOTE — Patient Outreach (Signed)
[facility]) Care Management ? ?06/30/2021 ? ?Travis Williams ?08/30/1955 ?[id number] ? ? ?Telephone call to patient for disease management hypertension. Patient doing well.  Blood pressure controlled per patient report.  Reiterated hypertension management.  No concerns.   ? ?Care Plan : RN CM Plan of Care  ?Updates made by Jon Billings, RN since 06/30/2021 12:00 AM  ?  ? ?Problem: [place] Disease Management and Care [place] Needs Hypertension   ?Priority: High  ?  ? ?Long-Range Goal: Development of Plan of care for the management of Hypertension   ?Start Date: 01/29/2021  ?Expected End Date: 03/19/2022  ?This Visit's Progress: On track  ?Priority: High  ?Note:   ?Current Barriers:  ?Chronic Disease Management support and education needs related to HTN ? ?RNCM Clinical Goal(s):  ?Patient will verbalize basic understanding of  HTN disease process and self health management plan as evidenced by blood pressure reports less than 140/80 ?take all medications exactly as prescribed and will call provider for medication related questions through collaboration with RN Care manager, provider, and care team.  ? ?Interventions: ?Education and support provided for Hypertension self management ?Inter-disciplinary care team collaboration (see longitudinal plan of care) ?Evaluation of current treatment plan related to  self management and patient's adherence to plan as established by provider ? ?Hypertension Interventions: ?Last practice recorded BP readings:  ?BP Readings from Last 3 Encounters:  ?12/06/20 (!) 159/77  ?12/03/20 (!) 154/85  ?Most recent eGFR/CrCl: No results found for: EGFR  No components found for: CRCL ? ?Evaluation of current treatment plan related to hypertension self management and patient's adherence to plan as established by provider; ?Provided education to patient re: stroke prevention, s/s of heart attack and stroke; ?Discussed plans with patient for ongoing care management follow up and  provided patient with direct contact information for care management team; ? ?Patient Goals/Self-Care Activities: Hypertension ?Patient will self administer medications as prescribed ?Patient will attend all scheduled provider appointments ?Limit salt intake ?Remain as active as possible ?Eat plenty of fruits and vegetables ?Take blood pressure at least weekly at home about one hour after medication and record in a notebook to take to doctor's visits.  ? ?06/30/21 Patient doing well.  Last blood pressure report 127/80.  Encouraged continued monitoring and limiting salt intake.  Patient to have sleep study in May.  Discussed sleep apnea, CPAP, and benefits.   ? ?Follow Up Plan:  Telephone follow up appointment with care management team member scheduled for:  July ?The patient has been provided with contact information for the care management team and has been advised to call with any health related questions or concerns.  ? ?  ? ?Plan: Follow-up: Patient agrees to Care Plan and Follow-up. ?Follow-up in July. ? ?Jone Baseman, RN, MSN ?[facility] Care Management ?Care Management Coordinator ?Direct Line [phone] ?Toll Free: [phone]  ?Fax: [phone] ? ?

## 2021-07-19 ENCOUNTER — Ambulatory Visit (INDEPENDENT_AMBULATORY_CARE_PROVIDER_SITE_OTHER): Payer: Medicare HMO | Admitting: Neurology

## 2021-07-19 DIAGNOSIS — G4730 Sleep apnea, unspecified: Secondary | ICD-10-CM

## 2021-07-19 DIAGNOSIS — G4733 Obstructive sleep apnea (adult) (pediatric): Secondary | ICD-10-CM

## 2021-07-19 DIAGNOSIS — I25110 Atherosclerotic heart disease of native coronary artery with unstable angina pectoris: Secondary | ICD-10-CM

## 2021-07-19 DIAGNOSIS — R0683 Snoring: Secondary | ICD-10-CM

## 2021-07-19 DIAGNOSIS — I651 Occlusion and stenosis of basilar artery: Secondary | ICD-10-CM

## 2021-07-19 DIAGNOSIS — G4719 Other hypersomnia: Secondary | ICD-10-CM

## 2021-07-21 NOTE — Addendum Note (Signed)
Addended by: Melvyn Novas on: 07/21/2021 06:39 PM ? ? Modules accepted: Orders ? ?

## 2021-07-21 NOTE — Progress Notes (Signed)
? ? ?  ?  ?[facility] ?  ?HOME SLEEP TEST REPORT ( by Watch PAT)   ?STUDY DATE:  07-21-2021 ?DOB: 07-Oct-1955.  ? ?  ?ORDERING CLINICIAN: Larey Seat, MD  ?REFERRING CLINICIAN: Antony Contras, MD  ?  ?CLINICAL INFORMATION/HISTORY:This patient was seen on [phone] in a Sleep medicine Consultation based on a referral by Primary neurologist Dr. Leonie Man, medical history : ? 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  ?Complaint of EDS, fatigue,  short sleep time of 5 hours only ,Nocturia, Snoring. ?  ?Epworth sleepiness score: 14/24. FSS at 42/63 points.  ?  ?BMI: 35.8kg/m? ?  ?Neck Circumference: 18" ?  ?FINDINGS: ?  ?Sleep Summary: ?  ?Total Recording Time (hours, min): Total recording time for this patient amounted to 7 hours and 59 minutes with a calculated total sleep time of 7 hours and 21 minutes.  REM sleep was present for 24% of sleep time.    ?  ?Respiratory Indices: ?  ?Calculated pAHI (per hour):   69.6/h                        ?  ?REM pAHI:     73.5/h                                          ?  ?NREM pAHI:   68.6/h                         ?Positional AHI:   This patient slept  in supine position for 157 minutes reaching an AHI of 102.3/h.  Sleep on the right side was recorded 470 minutes and associated with an AHI of 62/h . ?110 minutes of sleep on the left side were associated with an AHI of 28.8/h.     ? ?Snoring data show a mean volume of 42 dB accompanying over half of the recorded sleep time.                                          ?  ?Oxygen Saturation Statistics: ?   ?O2 Saturation Range (%):   Between a nadir at 75% saturation and a maximum at 99% with a mean oxygen saturation at 92%                                  ?  ?O2 Saturation (minutes) <89%: 19.3 minutes       ?  ?Pulse Rate Statistics: ?  ?Pulse Mean (bpm): 64 bpm             ?  ?Pulse Range:   Between 39 and 108 bpm.  Please note that only the heart rate not the cardiac rhythm is reflected in these data.             ?  ?IMPRESSION:  This HST confirms the presence of very severe sleep apnea with desaturations of over 4% for all events.  About 32% of respiratory events were central apnea or Cheyne-Stokes respirations.  This makes his apnea a complex apnea associated with moderate hypoxia, bradycardia tachycardia, and loud snoring. ?It was remarkable that the sleep architecture  was only moderately fragmented. ?It was also remarkable that the patient had a very short REM latency. ?  ?RECOMMENDATION: Given the complex nature of this patient's apnea I would prefer an in lab titration this will allow [place] to also add oxygen should it be necessary and to see if his mostly obstructive apnea would convert to central apnea under continuous positive airway pressure.  ? If that is the case the patient would be better served by BiPAP or ASV, and both modalities could be initiated in the lab. ? ?Should [employer] not authorized this needed sleep study I will have to ? ?Put this patient on an auto CPAP titrated and schedule a revisit with since 60 to 90 days on therapy.  I would find it much likely that the patient will be noncompliant should he be left to an autotitration device. ? ?  ?INTERPRETING PHYSICIAN: ? ? Larey Seat, MD  ? ?Medical Director of [facility] [employer].  ? ? ? ? ? ? ? ? ? ? ? ? ? ? ? ? ? ? ? ? ? ?

## 2021-07-21 NOTE — Procedures (Signed)
? ? ?  ?  ?[facility] ?  ?HOME SLEEP TEST REPORT ( by Watch PAT)   ?STUDY DATE:  07-21-2021 ?DOB: 08/26/1955.  ? ?  ?ORDERING CLINICIAN: Ulyess Muto, MD  ?REFERRING CLINICIAN: Pramod Sethi, MD  ?  ?CLINICAL INFORMATION/HISTORY:This patient was seen on [id number] in a Sleep medicine Consultation based on a referral by Primary neurologist Dr. Sethi, medical history : ? 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  ?Complaint of EDS, fatigue,  short sleep time of 5 hours only ,Nocturia, Snoring. ?  ?Epworth sleepiness score: 14/24. FSS at 42/63 points.  ?  ?BMI: 35.8kg/m? ?  ?Neck Circumference: 18" ?  ?FINDINGS: ?  ?Sleep Summary: ?  ?Total Recording Time (hours, min): Total recording time for this patient amounted to 7 hours and 59 minutes with a calculated total sleep time of 7 hours and 21 minutes.  REM sleep was present for 24% of sleep time.    ?  ?Respiratory Indices: ?  ?Calculated pAHI (per hour):   69.6/h                        ?  ?REM pAHI:     73.5/h                                          ?  ?NREM pAHI:   68.6/h                         ?Positional AHI:   This patient slept  in supine position for 157 minutes reaching an AHI of 102.3/h.  Sleep on the right side was recorded 470 minutes and associated with an AHI of 62/h . ?110 minutes of sleep on the left side were associated with an AHI of 28.8/h.     ? ?Snoring data show a mean volume of 42 dB accompanying over half of the recorded sleep time.                                          ?  ?Oxygen Saturation Statistics: ?   ?O2 Saturation Range (%):   Between a nadir at 75% saturation and a maximum at 99% with a mean oxygen saturation at 92%                                  ?  ?O2 Saturation (minutes) <89%: 19.3 minutes       ?  ?Pulse Rate Statistics: ?  ?Pulse Mean (bpm): 64 bpm             ?  ?Pulse Range:   Between 39 and 108 bpm.  Please note that only the heart rate not the cardiac rhythm is reflected in these data.             ?  ?IMPRESSION:  This HST confirms the presence of very severe sleep apnea with desaturations of over 4% for all events.  About 32% of respiratory events were central apnea or Cheyne-Stokes respirations.  This makes his apnea a complex apnea associated with moderate hypoxia, bradycardia tachycardia, and loud snoring. ?It was remarkable that the sleep architecture   was only moderately fragmented. ?It was also remarkable that the patient had a very short REM latency. ?  ?RECOMMENDATION: Given the complex nature of this patient's apnea I would prefer an in lab titration this will allow us to also add oxygen should it be necessary and to see if his mostly obstructive apnea would convert to central apnea under continuous positive airway pressure.  ? If that is the case the patient would be better served by BiPAP or ASV, and both modalities could be initiated in the lab. ? ?Should [employer] not authorized this needed sleep study I will have to ? ?Put this patient on an auto CPAP titrated and schedule a revisit with since 60 to 90 days on therapy.  I would find it much likely that the patient will be noncompliant should he be left to an autotitration device. ? ?  ?INTERPRETING PHYSICIAN: ? ? Imojean Yoshino, MD  ? ?Medical Director of [facility].  ? ? ? ? ? ? ? ? ? ? ? ? ? ? ? ? ? ? ? ? ? ?

## 2021-07-22 ENCOUNTER — Telehealth: Payer: Self-pay | Admitting: *Deleted

## 2021-07-22 NOTE — Telephone Encounter (Signed)
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  ? ?RECOMMENDATION: Given the complex nature of this patient's apnea I would prefer an in lab titration this will allow [place] to also add oxygen should it be necessary and to see if his mostly obstructive apnea would convert to central apnea under continuous positive airway pressure.  ? If that is the case the patient would be better served by BiPAP or ASV, and both modalities could be initiated in the lab. ?? ?Should [employer] not authorize this needed attended sleep study I will have to put this patient on an auto-CPAP titration device with 5-20 cm water pressure , heated humidification and mask of choice.  ? and schedule a revisit with him within 60 to 90 days on therapy.  ? PS : I would find it much more likely that the patient will be noncompliant should he be left to an autotitration device. ?

## 2021-07-22 NOTE — Telephone Encounter (Signed)
Called and spoke with pt about results per Dr. Edwena Felty note. He verbalized understanding and agreeable to in lab titration study. Aware order placed. Sleep lab will work on insurance auth and then contact him to schedule once they have approval via insurance. He verbalized understanding.  ?

## 2021-08-10 DIAGNOSIS — E782 Mixed hyperlipidemia: Secondary | ICD-10-CM | POA: Diagnosis not present

## 2021-08-10 DIAGNOSIS — E669 Obesity, unspecified: Secondary | ICD-10-CM | POA: Diagnosis not present

## 2021-08-10 DIAGNOSIS — E668 Other obesity: Secondary | ICD-10-CM | POA: Diagnosis not present

## 2021-08-10 DIAGNOSIS — I2510 Atherosclerotic heart disease of native coronary artery without angina pectoris: Secondary | ICD-10-CM | POA: Diagnosis not present

## 2021-08-10 DIAGNOSIS — I739 Peripheral vascular disease, unspecified: Secondary | ICD-10-CM | POA: Diagnosis not present

## 2021-08-10 DIAGNOSIS — I10 Essential (primary) hypertension: Secondary | ICD-10-CM | POA: Diagnosis not present

## 2021-08-10 DIAGNOSIS — I6389 Other cerebral infarction: Secondary | ICD-10-CM | POA: Diagnosis not present

## 2021-08-10 DIAGNOSIS — E1151 Type 2 diabetes mellitus with diabetic peripheral angiopathy without gangrene: Secondary | ICD-10-CM | POA: Diagnosis not present

## 2021-08-10 DIAGNOSIS — R0602 Shortness of breath: Secondary | ICD-10-CM | POA: Diagnosis not present

## 2021-09-01 ENCOUNTER — Other Ambulatory Visit: Payer: Self-pay

## 2021-09-01 NOTE — Patient Outreach (Signed)
[name]) Care Management  09/01/2021  Travis Williams [birth year] [id number]   Telephone call to patient for follow up. Patient doing well.  Patient meeting goals. Discussed case closure. Patient agreeable.  No concerns.    Care Plan : RN CM Plan of Care  Updates made by Jon Billings, RN since 09/01/2021 12:00 AM  Completed 09/01/2021   Problem: Chonic Disease Management and Care Coodination Needs Hypertension Resolved 09/01/2021  Priority: High     Long-Range Goal: Development of Plan of care for the management of Hypertension Completed 09/01/2021  Start Date: 01/29/2021  Expected End Date: 03/19/2022  This Visit's Progress: On track  Recent Progress: On track  Priority: High  Note:   Current Barriers:  Chronic Disease Management support and education needs related to HTN  RNCM Clinical Goal(s):  Patient will verbalize basic understanding of  HTN disease process and self health management plan as evidenced by blood pressure reports less than 140/80 take all medications exactly as prescribed and will call provider for medication related questions through collaboration with RN Care manager, provider, and care team.   Interventions: Education and support provided for Hypertension self management Inter-disciplinary care team collaboration (see longitudinal plan of care) Evaluation of current treatment plan related to  self management and patient's adherence to plan as established by provider  Hypertension Interventions: Last practice recorded BP readings:  BP Readings from Last 3 Encounters:  12/06/20 (!) 159/77  12/03/20 (!) 154/85  Most recent eGFR/CrCl: No results found for: EGFR  No components found for: CRCL  Evaluation of current treatment plan related to hypertension self management and patient's adherence to plan as established by provider; Provided education to patient re: stroke prevention, s/s of heart attack and stroke; Discussed plans with patient  for ongoing care management follow up and provided patient with direct contact information for care management team;  Patient Goals/Self-Care Activities: Hypertension Patient will self administer medications as prescribed Patient will attend all scheduled provider appointments Limit salt intake Remain as active as possible Eat plenty of fruits and vegetables Take blood pressure at least weekly at home about one hour after medication and record in a notebook to take to doctor's visits.   06/30/21 Patient doing well.  Last blood pressure report 127/80.  Encouraged continued monitoring and limiting salt intake.  Patient to have sleep study in May.  Discussed sleep apnea, CPAP, and benefits.    09/01/21 Patient reports doing well. Patient continues to monitor blood pressure and pressure is less than 140/80. Patient meeting goal.  Will close case and goal.    Follow Up Plan:  Telephone follow up appointment with care management team member scheduled for:  July The patient has been provided with contact information for the care management team and has been advised to call with any health related questions or concerns.      Plan: RN CM will close case as patient meeting goals.    Jone Baseman, RN, MSN [facility] Care Management Care Management Coordinator Direct Line [phone] Toll Free: [phone]  Fax: [phone]

## 2021-09-02 ENCOUNTER — Encounter: Payer: Self-pay | Admitting: Neurology

## 2021-09-02 ENCOUNTER — Ambulatory Visit: Payer: Medicare HMO | Admitting: Neurology

## 2021-09-02 ENCOUNTER — Telehealth: Payer: Self-pay | Admitting: Neurology

## 2021-09-02 VITALS — BP 121/73 | HR 67 | Ht 66.0 in | Wt 232.0 lb

## 2021-09-02 DIAGNOSIS — Z8673 Personal history of transient ischemic attack (TIA), and cerebral infarction without residual deficits: Secondary | ICD-10-CM

## 2021-09-02 DIAGNOSIS — G4733 Obstructive sleep apnea (adult) (pediatric): Secondary | ICD-10-CM | POA: Diagnosis not present

## 2021-09-02 NOTE — Telephone Encounter (Signed)
LVM for patient to schedule  Ethlyn Gallery: [id number] (exp. 09/02/21 to 10/02/21)

## 2021-09-02 NOTE — Progress Notes (Signed)
[facility] [address] [place]. [place] [address] [phone]       OFFICE FOLLOW-UP NOTE  Mr. Finnian Husted Date of Birth:  04/03/1955 Medical Record Number:  [id number]   HPI: Last visit 03/04/2021: Mr. Wojdyla is a 66 year old African-American male seen today for initial office follow-up visit following hospital admission for stroke in September 2022.  History is obtained from the patient and review of electronic medical records and opossum reviewed pertinent available imaging films in PACS.Carroll Lingelbach is a 66 y.o. male who only has PMH known of HTN.  He presented to [facility] 12/03/20 with R hemiplegia and slurred speech. NIHSS 16. Last known well at 1030 and found on floor at 1230CT head was negative. 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 He was given TNKase and then required intubation. 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 IR team, Dr. Loreta Ave, emergently consulted and patient was emergently taken to IR and underwent successful mechanical thrombectomy with rescue stent placement.  He was admitted to the ICU blood pressure was tightly controlled.  He was extubated and did well.  MRI scan showed scattered bilateral punctate cerebellar, brainstem and occipital lobe infarcts.  2D echo showed ejection fraction of 70 to 75%.  Cardiac monitoring did not show significant arrhythmias.  LDL cholesterol 136 mg percent.  Hemoglobin A1c was 5.7.  He was started on aspirin and Brilinta.  His only mild deficits included dysarthria and right facial droop.  He was discharged home and has done well.  His speech is recovered completely back to normal and he is tolerating aspirin and Plavix now as he was unable to afford Brilinta.  He has minor bruising but no bleeding.  He has quit smoking completely as well as alcohol.  His blood pressure is under good control today it is 118/86 6.  He is tolerating Lipitor but does complain of some  muscle aches.  He has not had any follow-up lipid profile checked.  He is able to ambulate independently without assistance.  He feels he is neurologically back to his baseline and has no deficits remaining from his stroke.  He has no new complaints today. Update 09/02/2021 ; He returns for follow-up after last visit 6 months ago.  He states she is doing well.  She has had no recurrent stroke or TIA symptoms.  He remains on aspirin and Plavix which is tolerating well without bruising or bleeding.  He has gained some weight and he knows he needs to lose it off.  He had a home sleep study done on 07/19/2021 which showed severe sleep apnea.  If is following up with Dr. Vickey Huger for the same and needs to be scheduled to have CPAP titration.  He had lab work done at last visit on 02/22/2021 and LDL cholesterol is optimal at 64 mg percent.  He is tolerating Lipitor well without muscle aches and pains.  He is also on Pletal for peripheral vascular disease.  His blood pressure is well controlled today it is 121/73. ROS:   14 system review of systems is positive for dysarthria, slurred speech, weakness, hemiplegia, bruising, myalgias all other systems negative  PMH:  Past Medical History:  Diagnosis Date   Hypertension    Stroke [facility])     Social History:  Social History   Socioeconomic History   Marital status: Single    Spouse name: Not on file   Number of children: 2   Years of education: Not  on file   Highest education level: High school graduate  Occupational History   Not on file  Tobacco Use   Smoking status: Former    Packs/day: 1.00    Years: 5.00    Total pack years: 5.00    Types: Cigarettes    Quit date: 32    Years since quitting: 26.4   Smokeless tobacco: Never  Vaping Use   Vaping Use: Never used  Substance and Sexual Activity   Alcohol use: Not Currently   Drug use: Never   Sexual activity: Not on file  Other Topics Concern   Not on file  Social History Narrative   Lives  w roommate friend   Right handed   Caffeine: energy drink once a week   Social Determinants of Health   Financial Resource Strain: Not on file  Food Insecurity: No Food Insecurity (01/29/2021)   Hunger Vital Sign    Worried About Running Out of Food in the Last Year: Never true    Ran Out of Food in the Last Year: Never true  Transportation Needs: No Transportation Needs (06/30/2021)   PRAPARE - Administrator, Civil Service (Medical): No    Lack of Transportation (Non-Medical): No  Physical Activity: Not on file  Stress: Not on file  Social Connections: Not on file  Intimate Partner Violence: Not on file    Medications:   Current Outpatient Medications on File Prior to Visit  Medication Sig Dispense Refill   amLODipine (NORVASC) 10 MG tablet Take 1 tablet (10 mg total) by mouth daily. 30 tablet 1   aspirin 81 MG chewable tablet Chew 1 tablet (81 mg total) by mouth daily.     atorvastatin (LIPITOR) 40 MG tablet Take 1 tablet (40 mg total) by mouth daily. 30 tablet 1   cilostazol (PLETAL) 100 MG tablet Take 100 mg by mouth 2 (two) times daily.     labetalol (NORMODYNE) 300 MG tablet Take 300 mg by mouth 2 (two) times daily.     lisinopril (ZESTRIL) 10 MG tablet Take 1 tablet (10 mg total) by mouth daily. 30 tablet 0   nitroGLYCERIN (NITROSTAT) 0.4 MG SL tablet Place 1 tablet (0.4 mg total) under the tongue every 5 (five) minutes as needed for chest pain. 20 tablet 12   UNABLE TO FIND Take by mouth as needed. Med Name: OTC Acid Reflux medication     Current Facility-Administered Medications on File Prior to Visit  Medication Dose Route Frequency Provider Last Rate Last Admin   sodium chloride flush (NS) 0.9 % injection 3 mL  3 mL Intravenous Q12H Laurier Nancy, MD        Allergies:  No Known Allergies  Physical Exam General: Mildly obese middle-aged African-American male, seated, in no evident distress Head: head normocephalic and atraumatic.  Neck: supple with no  carotid or supraclavicular bruits Cardiovascular: regular rate and rhythm, no murmurs Musculoskeletal: no deformity Skin:  no rash/petichiae Vascular:  Normal pulses all extremities Vitals:   09/02/21 1322  BP: 121/73  Pulse: 67   Neurologic Exam Mental Status: Awake and fully alert. Oriented to place and time. Recent and remote memory intact. Attention span, concentration and fund of knowledge appropriate. Mood and affect appropriate.  Diminished recall 2/3.  Able to name 11 animals which can walk on 4 legs.  Clock drawing 4/4. Cranial Nerves: Fundoscopic exam not done. Pupils equal, briskly reactive to light. Extraocular movements full without nystagmus. Visual fields full to confrontation. Hearing  intact. Facial sensation intact. Face, tongue, palate moves normally and symmetrically.  Motor: Normal bulk and tone. Normal strength in all tested extremity muscles. Sensory.: intact to touch ,pinprick .position and vibratory sensation.  Coordination: Rapid alternating movements normal in all extremities. Finger-to-nose and heel-to-shin performed accurately bilaterally. Gait and Station: Arises from chair without difficulty. Stance is normal. Gait demonstrates normal stride length and balance . Able to heel, toe and tandem walk with only mild difficulty.  Reflexes: 1+ and symmetric. Toes downgoing.   NIHSS  0 Modified Rankin  1   ASSESSMENT: 66 year old African-American male with scattered posterior circulation infarcts in September 2022 secondary to right vertebral and basilar artery occlusion treated with successful mechanical thrombectomy followed by rescue stenting.  Vascular risk factors of hypertension, hyperlipidemia, smoking and intracranial stenosis.  Patient has done amazingly well with practically no residual deficits or symptoms.     PLAN: I had a long d/w patient about his recent stroke, risk for recurrent stroke/TIAs, personally independently reviewed imaging studies and  stroke evaluation results and answered questions.Continue  aspirin 81 mg daily alone and stop Plavix  for secondary stroke prevention and maintain strict control of hypertension with blood pressure goal below 130/90, diabetes with hemoglobin A1c goal below 6.5% and lipids with LDL cholesterol goal below 70 mg/dL. I also advised the patient to eat a healthy diet with plenty of whole grains, cereals, fruits and vegetables, exercise regularly and maintain ideal body weight .follow-up for CPAP titration in the sleep lab.  Check screening carotid ultrasound and transcranial Doppler studies followup in the future with me in 1 year or call earlier if needed. Greater than 50% of time during this 35 minute visit was spent on counseling,explanation of diagnosis, planning of further management, discussion with patient and family and coordination of care Delia Heady, MD Note: This document was prepared with digital dictation and possible smart phrase technology. Any transcriptional errors that result from this process are unintentional

## 2021-09-02 NOTE — Patient Instructions (Signed)
I had a long d/w patient about his recent stroke, risk for recurrent stroke/TIAs, personally independently reviewed imaging studies and stroke evaluation results and answered questions.Continue  aspirin 81 mg daily alone and stop Plavix  for secondary stroke prevention and maintain strict control of hypertension with blood pressure goal below 130/90, diabetes with hemoglobin A1c goal below 6.5% and lipids with LDL cholesterol goal below 70 mg/dL. I also advised the patient to eat a healthy diet with plenty of whole grains, cereals, fruits and vegetables, exercise regularly and maintain ideal body weight .follow-up for CPAP titration in the sleep lab.  Check screening carotid ultrasound and transcranial Doppler studies followup in the future with me in 1 year or call earlier if needed.

## 2021-09-09 ENCOUNTER — Ambulatory Visit (INDEPENDENT_AMBULATORY_CARE_PROVIDER_SITE_OTHER): Payer: Medicare HMO | Admitting: Neurology

## 2021-09-09 DIAGNOSIS — G4733 Obstructive sleep apnea (adult) (pediatric): Secondary | ICD-10-CM

## 2021-09-09 DIAGNOSIS — G4730 Sleep apnea, unspecified: Secondary | ICD-10-CM

## 2021-09-09 DIAGNOSIS — G4719 Other hypersomnia: Secondary | ICD-10-CM

## 2021-09-09 DIAGNOSIS — I651 Occlusion and stenosis of basilar artery: Secondary | ICD-10-CM

## 2021-09-09 DIAGNOSIS — I25110 Atherosclerotic heart disease of native coronary artery with unstable angina pectoris: Secondary | ICD-10-CM

## 2021-09-09 DIAGNOSIS — G4734 Idiopathic sleep related nonobstructive alveolar hypoventilation: Secondary | ICD-10-CM

## 2021-09-09 DIAGNOSIS — G4731 Primary central sleep apnea: Secondary | ICD-10-CM

## 2021-09-09 DIAGNOSIS — R0683 Snoring: Secondary | ICD-10-CM

## 2021-09-13 DIAGNOSIS — H524 Presbyopia: Secondary | ICD-10-CM | POA: Diagnosis not present

## 2021-09-14 DIAGNOSIS — G4731 Primary central sleep apnea: Secondary | ICD-10-CM | POA: Insufficient documentation

## 2021-09-14 DIAGNOSIS — G4730 Sleep apnea, unspecified: Secondary | ICD-10-CM | POA: Insufficient documentation

## 2021-09-14 DIAGNOSIS — R0683 Snoring: Secondary | ICD-10-CM | POA: Insufficient documentation

## 2021-09-14 DIAGNOSIS — G4739 Other sleep apnea: Secondary | ICD-10-CM | POA: Insufficient documentation

## 2021-09-15 ENCOUNTER — Ambulatory Visit (HOSPITAL_COMMUNITY)
Admission: RE | Admit: 2021-09-15 | Discharge: 2021-09-15 | Disposition: A | Discharge status: Home / Self Care | Payer: Medicare HMO | Source: Ambulatory Visit | Attending: Neurology | Admitting: Neurology

## 2021-09-15 ENCOUNTER — Ambulatory Visit (HOSPITAL_BASED_OUTPATIENT_CLINIC_OR_DEPARTMENT_OTHER)
Admission: RE | Admit: 2021-09-15 | Discharge: 2021-09-15 | Disposition: A | Discharge status: Home / Self Care | Payer: Medicare HMO | Source: Ambulatory Visit | Attending: Neurology | Admitting: Neurology

## 2021-09-15 DIAGNOSIS — G4733 Obstructive sleep apnea (adult) (pediatric): Secondary | ICD-10-CM

## 2021-09-15 DIAGNOSIS — Z8673 Personal history of transient ischemic attack (TIA), and cerebral infarction without residual deficits: Secondary | ICD-10-CM

## 2021-09-15 NOTE — Progress Notes (Signed)
Carotid artery duplex and transcranial Doppler completed. Refer to "CV Proc" under chart review to view preliminary results.  09/15/2021 12:56 PM Eula Fried., MHA, RVT, RDCS, RDMS

## 2021-09-16 ENCOUNTER — Telehealth: Payer: Self-pay | Admitting: Neurology

## 2021-09-16 NOTE — Telephone Encounter (Signed)
Please call patient, ultrasound of carotid artery showed no significant stenosis, transcranial Doppler study showed no evidence of significant intracranial vascular disease  Continue current management   This was a normal transcranial Doppler study, with normal flow direction and velocity of all identified vessels of the anterior and posterior circulations, with no evidence of stenosis, vasospasm or occlusion. There was no evidence of intracranial  disease.

## 2021-09-16 NOTE — Telephone Encounter (Signed)
Left patient a detailed message, with results, on voicemail (ok per DPR).  Provided our number to call back with any questions.  

## 2021-09-16 NOTE — Telephone Encounter (Signed)
-----   Message from Melvyn Novas, MD sent at 09/14/2021  2:10 PM EDT ----- RESPIRATORY ANALYSIS:  There was a total of 229 respiratory events: 169 obstructive apneas, 54 central apneas and 4 mixed apneas with a total of 227 apneas and an apnea index (AHI) of 36.6 /hour. There were 2 hypopneas with a hypopnea index of .3/hour.   The total APNEA/HYPOPNEA INDEX (AHI) was 36.9 /hour .  24 events occurred in REM sleep and 205 events in NREM. The REM AHI was 20.3 /hour versus a non-REM AHI of 40.8 /hour.   DIAGNOSIS 1. Complex Sleep Apnea did not respond to CPAP, BiPAP or BIPAP ST- yet the patient slept 90% of the recorded time.  2. Central Sleep Apnea arose under BiPAP ST 12 following increase to 22/18 cm and above.  3. Sleep Related Hypoxemia was seen in form of deep desats, but for brief periods of time.   PLANS/RECOMMENDATIONS:  the patient reach ed over 50% central apneas at the end of titration to BiPAP ST and will need to return for ASV. No pressure of CPAP or BiPAP helped to reduce the AHI below single digits.   DISCUSSION: ASV return

## 2021-09-16 NOTE — Telephone Encounter (Signed)
[employer] pending faxed notes.  

## 2021-09-16 NOTE — Telephone Encounter (Signed)
I called pt. I advised pt that Dr. Vickey Huger reviewed their sleep study results and found that pt was not treated with the BiPAP ST and recommends that pt be treated with a ASV Dr. Vickey Huger recommends that pt return for a repeat sleep study in order to properly titrate the ASV and ensure a good mask fit. Pt is agreeable to returning for a titration study. I advised pt that our sleep lab will file with pt's insurance and call pt to schedule the sleep study when we hear back from the pt's insurance regarding coverage of this sleep study. Pt verbalized understanding of results. Pt had no questions at this time but was encouraged to call back if questions arise.

## 2021-09-22 NOTE — Telephone Encounter (Signed)
ASV Titration- [employer]: [id number] (exp. 09/16/21 to 10/16/21)  patient is scheduled at [facility] for 10/05/21 at 9 pm.

## 2021-09-28 ENCOUNTER — Ambulatory Visit: Payer: Self-pay

## 2021-10-05 ENCOUNTER — Ambulatory Visit (INDEPENDENT_AMBULATORY_CARE_PROVIDER_SITE_OTHER): Payer: Medicare HMO | Admitting: Neurology

## 2021-10-05 DIAGNOSIS — G4731 Primary central sleep apnea: Secondary | ICD-10-CM | POA: Diagnosis not present

## 2021-10-05 DIAGNOSIS — G4730 Sleep apnea, unspecified: Secondary | ICD-10-CM

## 2021-10-05 DIAGNOSIS — R0683 Snoring: Secondary | ICD-10-CM

## 2021-10-05 DIAGNOSIS — I651 Occlusion and stenosis of basilar artery: Secondary | ICD-10-CM

## 2021-10-05 DIAGNOSIS — G4733 Obstructive sleep apnea (adult) (pediatric): Secondary | ICD-10-CM

## 2021-10-12 NOTE — Progress Notes (Signed)
DIAGNOSIS 1. Central Sleep Apnea controlled under ASV of 13 over 12 cm water with 6 cm EEP.   Medium sized SIMPLUS mask , FFM by Sherrie Mustache and Paykel.   PLANS/RECOMMENDATIONS: start ASV as defied above.  1. Any apnea patient should avoid sedatives, hypnotics, and alcohol consumption close to bedtime. 2. ASV-PAP therapy compliance is defined as 4 hours or more of nightly use.   DISCUSSION: A follow up appointment will be scheduled with Ihor Austin, NP  in the Sleep Clinic at [facility].   Please call [phone] with any questions.

## 2021-10-12 NOTE — Procedures (Signed)
PATIENT'S NAME:  Travis Williams, Travis Williams DOB:      July 07, 1955      MR#:    [id number]     DATE OF RECORDING: 10/05/2021 REFERRING M.D.:  Delia Heady, MD Study Performed:   ASV Titration HISTORY:  Dr Pearlean Brownie: Travis Williams is a 66 year old African-American male seen for follow-up on hospital admission for stroke in September 2022.   Travis Williams presented to [facility] 12/03/20 with R hemiplegia and slurred speech. NIHSS 16. 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  Travis Williams is a 66 y.o.  African American male patient / Primary neurologist Dr. Pearlean Brownie, was found to have severe Complex Sleep Apnea in a HST and returns for PAP therapy: HST performed on 07/21/2021 revealed: very severe sleep apnea with  desaturations of over 4% for all events.  About 32% of respiratory events were central apnea or Cheyne-Stokes respirations.   This makes his apnea a complex apnea associated with moderate hypoxia, bradycardia tachycardia, and loud snoring The total APNEA/HYPOPNEA INDEX (AHI) was 36.9 /hour. The patient had returned on 09-09-21,for PAP titration with switch to BiPAP:  Complex Sleep Apnea did not respond to CPAP, BiPAP or BIPAP ST- yet the patient slept 90% of the recorded time.  Central Sleep Apnea arose under BiPAP ST 12 following increase to 22/18 cm and above.  Sleep Related Hypoxemia was seen. PLANS/RECOMMENDATIONS:  the patient reached over 50% central apneas at the end of titration to BiPAP ST and will need to return for ASV. No pressure of CPAP or BiPAP helped to reduce the AHI below single digits.    The patient endorsed the Epworth Sleepiness Scale at 14/24 points, FSS at 42/ 63 points.   The patient's weight 222 pounds with a height of 66 (inches), resulting in a BMI of 35.8 kg/m2. The patient's neck circumference measured 18 inches.  CURRENT MEDICATIONS: Norvasc, Aspirin, Lipitor, Pletal, Plavix, Normodyne, Zestril, Nitrostat    PROCEDURE:  This is a multichannel digital polysomnogram  utilizing the SomnoStar 11.2 system.  Electrodes and sensors were applied and monitored per AASM Specifications.   EEG, EOG, Chin and Limb EMG, were sampled at 200 Hz.  ECG, Snore and Nasal Pressure, Thermal Airflow, Respiratory Effort, CPAP Flow and Pressure, Oximetry was sampled at 50 Hz. Digital video and audio were recorded.       ASV was initiated under a SIMPLUS fisher and Paykel FFM in medium- at 15/5 with 3 cm water EPAP, with heated humidity per AASM standards and the ASV  pressure was advanced to  13/12 /0/6 cmH20 because of hypopneas, apneas and desaturations.  At that ASV PAP pressure of maximum PS 13/minimal PS 12/and EPAP of 6 cmH20, there was a reduction of the AHI to 1.1/h with improvement of sleep apnea.  Lights Out was at 21:37 and Lights On at 05:00. Total recording time (TRT) was 443 minutes, with a total sleep time (TST) of 295 minutes. The patient's sleep latency was 31.5 minutes. REM latency was 91 minutes.  The sleep efficiency was 66.6 %.    SLEEP ARCHITECTURE: WASO (Wake after sleep onset) was 116.5 minutes.  There were 39.5 minutes in Stage N1, 186.5 minutes Stage N2, 28 minutes Stage N3 and 41 minutes in Stage REM.  The percentage of Stage N1 was 13.4%, Stage N2 was 63.2%, Stage N3 was 9.5% and Stage R (REM sleep) was 13.9%.   RESPIRATORY ANALYSIS:  There was a total of 59 respiratory events: 0 obstructive apneas, 5 central apneas and 0  mixed apneas with a total of 5 apneas and an apnea index (AI) of 1. /hour. There were 54 hypopneas with a hypopnea index of 11./hour.   The total APNEA/HYPOPNEA INDEX (AHI) was 12 /.  21 events occurred in REM sleep and 38 events in NREM. The REM AHI was 30.7 /hour versus a non-REM AHI of 9.0 /hour.   The patient spent 119 minutes of total sleep time in the supine position and 176 minutes in non-supine. The supine AHI was 24.2, versus a non-supine AHI of 3.7.  OXYGEN SATURATION & C02:  The baseline 02 saturation was 96%, with the lowest being  86%. Time spent below 89% saturation equaled 2 minutes.  PERIODIC LIMB MOVEMENTS:  The patient had a total of 6 Periodic Limb Movements. The Periodic Limb Movement (PLM) Arousal index was 0 /hour.  The arousals were noted as: 51 were spontaneous, 0 were associated with PLMs, 14 were associated with respiratory events.  Audio and video analysis did not show any abnormal or unusual movements, behaviors, phonations or vocalizations.   EKG was in keeping with normal sinus rhythm (NSR).  DIAGNOSIS Central Sleep Apnea controlled under ASV of 13 over 12 cm water with 6 cm EEP.   Medium sized SIMPLUS mask , FFM by Sherrie Mustache and Paykel.   PLANS/RECOMMENDATIONS: start ASV as defied above.  Any apnea patient should avoid sedatives, hypnotics, and alcohol consumption close to bedtime. ASV-PAP therapy compliance is defined as 4 hours or more of nightly use.   DISCUSSION: A follow up appointment will be scheduled with Travis Austin, NP  in the Sleep Clinic at [facility].   Please call [phone] with any questions.      I certify that I have reviewed the entire raw data recording prior to the issuance of this report in accordance with the Standards of Accreditation of the American Academy of Sleep Medicine (AASM)      Melvyn Novas, M.D. Diplomat, Biomedical engineer of Psychiatry and Neurology  Diplomat, Biomedical engineer of Sleep Medicine Wellsite geologist, [employer] [facility] [employer]

## 2021-10-12 NOTE — Addendum Note (Signed)
Addended by: Melvyn Novas on: 10/12/2021 05:24 PM   Modules accepted: Orders

## 2021-10-13 ENCOUNTER — Telehealth: Payer: Self-pay | Admitting: Neurology

## 2021-10-13 NOTE — Telephone Encounter (Signed)
-----   Message from Carmen Dohmeier, MD sent at 10/12/2021  5:24 PM EDT ----- DIAGNOSIS 1. Central Sleep Apnea controlled under ASV of 13 over 12 cm water with 6 cm EEP.   Medium sized SIMPLUS mask , FFM by Fisher and Paykel.   PLANS/RECOMMENDATIONS: start ASV as defied above.  1. Any apnea patient should avoid sedatives, hypnotics, and alcohol consumption close to bedtime. 2. ASV-PAP therapy compliance is defined as 4 hours or more of nightly use.   DISCUSSION: A follow up appointment will be scheduled with Jessica McCue, NP  in the Sleep Clinic at [facility].   Please call [phone] with any questions.     

## 2021-10-13 NOTE — Telephone Encounter (Signed)
I called Travis Williams. I advised Travis Williams that Dr. Vickey Huger reviewed their sleep study results and found that Travis Williams was best treated with ASV. Dr. Vickey Huger recommends that Travis Williams starts ASV. I reviewed PAP compliance expectations with the Travis Williams. Travis Williams is agreeable to starting a ASV. I advised Travis Williams that an order will be sent to a DME, [facility], and  will call the Travis Williams within about one week after they file with the Travis Williams's insurance. [facility] will show the Travis Williams how to use the machine, fit for masks, and troubleshoot the ASV if needed. A follow up appt was made for insurance purposes with Dr. Vickey Huger on Oct 23,2023 at 8:30 am. Travis Williams verbalized understanding to arrive 15 minutes early and bring their ASV. A letter with all of this information in it will be mailed to the Travis Williams as a reminder. I verified with the Travis Williams that the address we have on file is correct. Travis Williams verbalized understanding of results. Travis Williams had no questions at this time but was encouraged to call back if questions arise. I have sent the order to [facility] and have received confirmation that they have received the order.

## 2021-10-13 NOTE — Telephone Encounter (Signed)
-----   Message from Melvyn Novas, MD sent at 10/12/2021  5:24 PM EDT ----- DIAGNOSIS 1. Central Sleep Apnea controlled under ASV of 13 over 12 cm water with 6 cm EEP.   Medium sized SIMPLUS mask , FFM by Sherrie Mustache and Paykel.   PLANS/RECOMMENDATIONS: start ASV as defied above.  1. Any apnea patient should avoid sedatives, hypnotics, and alcohol consumption close to bedtime. 2. ASV-PAP therapy compliance is defined as 4 hours or more of nightly use.   DISCUSSION: A follow up appointment will be scheduled with Ihor Austin, NP  in the Sleep Clinic at [facility].   Please call [phone] with any questions.

## 2021-12-10 DIAGNOSIS — E668 Other obesity: Secondary | ICD-10-CM | POA: Diagnosis not present

## 2021-12-10 DIAGNOSIS — I10 Essential (primary) hypertension: Secondary | ICD-10-CM | POA: Diagnosis not present

## 2021-12-10 DIAGNOSIS — I739 Peripheral vascular disease, unspecified: Secondary | ICD-10-CM | POA: Diagnosis not present

## 2021-12-10 DIAGNOSIS — E1151 Type 2 diabetes mellitus with diabetic peripheral angiopathy without gangrene: Secondary | ICD-10-CM | POA: Diagnosis not present

## 2021-12-10 DIAGNOSIS — E782 Mixed hyperlipidemia: Secondary | ICD-10-CM | POA: Diagnosis not present

## 2021-12-10 DIAGNOSIS — E669 Obesity, unspecified: Secondary | ICD-10-CM | POA: Diagnosis not present

## 2021-12-10 DIAGNOSIS — I6389 Other cerebral infarction: Secondary | ICD-10-CM | POA: Diagnosis not present

## 2021-12-10 DIAGNOSIS — I2510 Atherosclerotic heart disease of native coronary artery without angina pectoris: Secondary | ICD-10-CM | POA: Diagnosis not present

## 2022-01-10 ENCOUNTER — Ambulatory Visit (INDEPENDENT_AMBULATORY_CARE_PROVIDER_SITE_OTHER): Payer: Medicare HMO | Admitting: Neurology

## 2022-01-10 ENCOUNTER — Encounter: Payer: Self-pay | Admitting: Neurology

## 2022-01-10 VITALS — BP 118/73 | HR 77 | Ht 66.0 in | Wt 239.5 lb

## 2022-01-10 DIAGNOSIS — M2619 Other specified anomalies of jaw-cranial base relationship: Secondary | ICD-10-CM | POA: Diagnosis not present

## 2022-01-10 DIAGNOSIS — G4730 Sleep apnea, unspecified: Secondary | ICD-10-CM | POA: Diagnosis not present

## 2022-01-10 DIAGNOSIS — G4731 Primary central sleep apnea: Secondary | ICD-10-CM | POA: Diagnosis not present

## 2022-01-10 DIAGNOSIS — Z9989 Dependence on other enabling machines and devices: Secondary | ICD-10-CM

## 2022-01-10 NOTE — Patient Instructions (Signed)
Quality Sleep Information, Adult Quality sleep is important for your mental and physical health. It also improves your quality of life. Quality sleep means you: Are asleep for most of the time you are in bed. Fall asleep within 30 minutes. Wake up no more than once a night. Are awake for no longer than 20 minutes if you do wake up during the night. Most adults need 7-8 hours of quality sleep each night. How can poor sleep affect me? If you do not get enough quality sleep, you may have: Mood swings. Daytime sleepiness. Decreased alertness, reaction time, and concentration. Sleep disorders, such as insomnia and sleep apnea. Difficulty with: Solving problems. Coping with stress. Paying attention. These issues may affect your performance and productivity at work, school, and home. Lack of sleep may also put you at higher risk for accidents, suicide, and risky behaviors. If you do not get quality sleep, you may also be at higher risk for several health problems, including: Infections. Type 2 diabetes. Heart disease. High blood pressure. Obesity. Worsening of long-term conditions, like arthritis, kidney disease, depression, Parkinson's disease, and epilepsy. What actions can I take to get more quality sleep? Sleep schedule and routine Stick to a sleep schedule. Go to sleep and wake up at about the same time each day. Do not try to sleep less on weekdays and make up for lost sleep on weekends. This does not work. Limit naps during the day to 30 minutes or less. Do not take naps in the late afternoon. Make time to relax before bed. Reading, listening to music, or taking a hot bath promotes quality sleep. Make your bedroom a place that promotes quality sleep. Keep your bedroom dark, quiet, and at a comfortable room temperature. Make sure your bed is comfortable. Avoid using electronic devices that give off bright blue light for 30 minutes before bedtime. Your brain perceives bright blue light  as sunlight. This includes television, phones, and computers. If you are lying awake in bed for longer than 20 minutes, get up and do a relaxing activity until you feel sleepy. Lifestyle     Try to get at least 30 minutes of exercise on most days. Do not exercise 2-3 hours before going to bed. Do not use any products that contain nicotine or tobacco. These products include cigarettes, chewing tobacco, and vaping devices, such as e-cigarettes. If you need help quitting, ask your health care provider. Do not drink caffeinated beverages for at least 8 hours before going to bed. Coffee, tea, and some sodas contain caffeine. Do not drink alcohol or eat large meals close to bedtime. Try to get at least 30 minutes of sunlight every day. Morning sunlight is best. Medical concerns Work with your health care provider to treat medical conditions that may affect sleeping, such as: Nasal obstruction. Snoring. Sleep apnea and other sleep disorders. Talk to your health care provider if you think any of your prescription medicines may cause you to have difficulty falling or staying asleep. If you have sleep problems, talk with a sleep consultant. If you think you have a sleep disorder, talk with your health care provider about getting evaluated by a specialist. Where to find more information Sleep Foundation: sleepfoundation.org American Academy of Sleep Medicine: aasm.org Centers for Disease Control and Prevention (CDC): cdc.gov Contact a health care provider if: You have trouble getting to sleep or staying asleep. You often wake up very early in the morning and cannot get back to sleep. You have daytime sleepiness. You   have daytime sleep attacks of suddenly falling asleep and sudden muscle weakness (narcolepsy). You have a tingling sensation in your legs with a strong urge to move your legs (restless legs syndrome). You stop breathing briefly during sleep (sleep apnea). You think you have a sleep  disorder or are taking a medicine that is affecting your quality of sleep. Summary Most adults need 7-8 hours of quality sleep each night. Getting enough quality sleep is important for your mental and physical health. Make your bedroom a place that promotes quality sleep, and avoid things that may cause you to have poor sleep, such as alcohol, caffeine, smoking, or large meals. Talk to your health care provider if you have trouble falling asleep or staying asleep. This information is not intended to replace advice given to you by your health care provider. Make sure you discuss any questions you have with your health care provider. Document Revised: 06/30/2021 Document Reviewed: 06/30/2021 Elsevier Patient Education  2023 Elsevier Inc.  

## 2022-01-10 NOTE — Addendum Note (Signed)
Addended by: Larey Seat on: 01/10/2022 09:13 AM   Modules accepted: Orders

## 2022-01-10 NOTE — Progress Notes (Addendum)
SLEEP MEDICINE CLINIC    Provider:  Melvyn Novas, MD  Primary Care Physician:  Laurier Nancy, MD [address] [place] [address]     Referring Provider: Laurier Nancy, Md [address] [place],  [place] [address]          Chief Complaint according to patient   Patient presents with:     New Patient (Initial Visit)     Follow up with Micki Riley, MD (Neurology).      HISTORY OF PRESENT ILLNESS:    01-10-2022:  Travis Williams is a 66 y.o.  African American male patient seen upon referral by Dr Pearlean Brownie, MD , on 01/10/2022 . Chief concern according to patient :  Stroke in 11-2020, treated at [facility], Followed by Dr Pearlean Brownie, MD. . 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  Also unstable angina.  I have the pleasure of seeing Travis Williams today as a follow up visit this patient had undergone several sleep studies the first 1 was a home sleep test 5 WatchPAT performed on 21 Jul 2021.  That showed a very severe sleep apnea with an AHI of sixteen 9.6.  Forced to simplify other rounds is up to 70 his REM pAHI was 73 and his non-REM sleep AHI was 69 so there was no significant difference between REM and non-REM sleep there was a much greater AHI when the patient slept supine the AHI was 102 for those on the left side associated with an AHI of 28 point he did have about 20 minutes of sustained desaturations and his nadir was 75% there was complex apnea noted so these were not all obstructive events and for that reason I asked for an in lab titration.  This followed on 09 September 2021 and beginning at 5 cm water the patient's CPAP was titrated to 18 cm for central apneas became abundant and obstructive apnea was still not controlled.  He was switched to BiPAP starting at 18/14 cmH2O and finally to ST the respiratory tracking of 12/min.    The final pressure was an ST of 24/20 with an ST rate of 12 cm water, but it still had  significant residual apnea.  For that reason he return for an AV adapted servo ventilation.  This was performed on 7-18/23 patient's AHI was finally significantly reduced this an ASV of 13 and EPAP of 6 cm of water and a medium size Simplus mask a Fisher Paykel fullface model was applied.  I am sorry the ASV was initiated with a minimum pressure support of 6 cm water,  maximum pressure support of 13 cm water ,  and an EPAP of 6 cm water the patient reached an AHI of 1.1/h.  Today,  I see the compliance report and Travis Williams has used the machine 100% of days and 100% of those for 4 hours or more with an average of almost 7 hours of sleep.  The settings were quoted above he is now set to an EPAP of 6 and minimum pressure support of 6, maximum pressure support of 13 cm water and has a residual apnea index of 3.7 but there is still 7.7 hypopneas.  There is some significant air leakage and I would suggest to try to refit the mask I believe that the air leakage leads to an artificially high AHI reading.   Subjective benefit : very severe Apnea, still sleeping on his back with a FFM-  he was set up by adapt .   only one bathroom break , down from 3 times before ASV.  One power nap in the afternoons, 30 minutes.  Sleeping 6.5 hours now, before treatment 4-6 hours. No morning headaches.   BMI is up ! From 35.8 pre HST to now 38.6.  Not prediabetic.   GDS is 4 / 15. FSS is 51/ 63.  How likely are you to doze in the following situations: 0 = not likely, 1 = slight chance, 2 = moderate chance, 3 = high chance  Sitting and Reading?1 Watching Television? 3 Sitting inactive in a public place (theater or meeting)?2 Lying down in the afternoon when circumstances permit?3 Sitting and talking to someone?1 Sitting quietly after lunch without alcohol?3 In a car, while stopped for a few minutes in traffic? 0 As a passenger in a car for an hour without a break?1  Total = 12/ 24 points.       4-4- 2023:  The  patient never had a sleep study .  Sleep relevant medical history: Nocturia 2-4 times, snoring loudly all his life, CVA and angina. HTN .  Family medical /sleep history: No other family member on CPAP with OSA.    Social history:  Patient is  retired from [employer], academic and lives in a household with GF,  is divorced-  2 adult children, 7 grandchildren.  The patient currently works/ used to work in shifts( night/ rotating,) Tobacco use; remotely .   ETOH use - none since his stroke.  Caffeine intake in form of Soda( mountain dew -  quit with the stroke) Tea ( /) or energy drinks.     Sleep habits are as follows: The patient's dinner time is between 8 PM. The patient plays on his computer, watches TV , then goes to bed at 2 AM and that is a life long habit, used to work second shift all his working life. continues to sleep for less than 5 hours, wakes for many bathroom breaks. The preferred sleep position is laterally, with the support of 2 pillows. Flat bed. Dreams are reportedly rare.   7 AM is the usual rise time. The patient's dog wakes him up.  He reports feeling refreshed /restored in AM, with symptoms such as dry mouth, residual fatigue.  Naps are taken infrequently, but he dozes off-lasting from 10 to 15 minutes and are  refreshing .   Review of Systems: Out of a complete 14 system review, the patient complains of only the following symptoms, and all other reviewed systems are negative.:  Fatigue, sleepiness , snoring, fragmented sleep, Nocturia, short sleeper, all his life    How likely are you to doze in the following situations: 0 = not likely, 1 = slight chance, 2 = moderate chance, 3 = high chance   Sitting and Reading? Watching Television? Sitting inactive in a public place (theater or meeting)? As a passenger in a car for an hour without a break? Lying down in the afternoon when circumstances permit? Sitting and talking to someone? Sitting quietly after lunch without  alcohol? In a car, while stopped for a few minutes in traffic?   April 2023:  Total = 14/ 24 points   FSS endorsed at 42/ 63 points.   01-10-2022: Subjective benefit : very severe Apnea, still sleeping on his back with a FFM- he was set up by adapt .   only one bathroom break , down from 3 times before ASV.  One power  nap in the afternoons, 30 minutes.  Sleeping 6.5 hours now, before treatment 4-6 hours. No morning headaches.   BMI is up ! From 35.8 pre HST to now 38.6 kg/ m2.  Not prediabetic.   GDS is 4 / 15. FSS is 51/ 63.  How likely are you to doze in the following situations: 0 = not likely, 1 = slight chance, 2 = moderate chance, 3 = high chance  Sitting and Reading?1 Watching Television? 3 Sitting inactive in a public place (theater or meeting)?2 Lying down in the afternoon when circumstances permit?3 Sitting and talking to someone?1 Sitting quietly after lunch without alcohol?3 In a car, while stopped for a few minutes in traffic? 0 As a passenger in a car for an hour without a break?1  Total = 12/ 24 points.      Social History   Socioeconomic History   Marital status: Single    Spouse name: Not on file   Number of children: 2   Years of education: Not on file   Highest education level: High school graduate  Occupational History   Not on file  Tobacco Use   Smoking status: Former    Packs/day: 1.00    Years: 5.00    Total pack years: 5.00    Types: Cigarettes    Quit date: 74    Years since quitting: 26.8   Smokeless tobacco: Never  Vaping Use   Vaping Use: Never used  Substance and Sexual Activity   Alcohol use: Not Currently   Drug use: Never   Sexual activity: Not on file  Other Topics Concern   Not on file  Social History Narrative   Lives w roommate friend   Right handed   Caffeine: energy drink once a week   Social Determinants of Health   Financial Resource Strain: Not on file  Food Insecurity: No Food Insecurity (01/29/2021)    Hunger Vital Sign    Worried About Running Out of Food in the Last Year: Never true    Ran Out of Food in the Last Year: Never true  Transportation Needs: No Transportation Needs (06/30/2021)   PRAPARE - Hydrologist (Medical): No    Lack of Transportation (Non-Medical): No  Physical Activity: Not on file  Stress: Not on file  Social Connections: Not on file    Family History  Problem Relation Age of Onset   Hypertension Brother     Past Medical History:  Diagnosis Date   Hypertension    Stroke [facility])     Past Surgical History:  Procedure Laterality Date   CORONARY STENT INTERVENTION N/A 02/19/2021   Procedure: CORONARY STENT INTERVENTION;  Surgeon: Yolonda Kida, MD;  Location: [facility];  Service: Cardiovascular;  Laterality: N/A;   IR CT HEAD LTD  12/03/2020   IR INTRA CRAN STENT  12/03/2020   IR PERCUTANEOUS ART THROMBECTOMY/INFUSION INTRACRANIAL INC DIAG ANGIO  12/03/2020   IR RADIOLOGIST EVAL & MGMT  02/23/2021   IR US GUIDE VASC ACCESS RIGHT  12/03/2020   LEFT HEART CATH AND CORONARY ANGIOGRAPHY N/A 02/19/2021   Procedure: LEFT HEART CATH AND CORONARY ANGIOGRAPHY with intervention;  Surgeon: Dionisio David, MD;  Location: [facility];  Service: Cardiovascular;  Laterality: N/A;   RADIOLOGY WITH ANESTHESIA N/A 12/03/2020   Procedure: IR WITH ANESTHESIA;  Surgeon: Radiologist, Medication, MD;  Location: [facility];  Service: Radiology;  Laterality: N/A;     Current Outpatient Medications  on File Prior to Visit  Medication Sig Dispense Refill   amLODipine (NORVASC) 10 MG tablet Take 1 tablet (10 mg total) by mouth daily. 30 tablet 1   aspirin 81 MG chewable tablet Chew 1 tablet (81 mg total) by mouth daily.     atorvastatin (LIPITOR) 40 MG tablet Take 1 tablet (40 mg total) by mouth daily. 30 tablet 1   cilostazol (PLETAL) 100 MG tablet Take 100 mg by mouth 2 (two) times daily.     labetalol (NORMODYNE) 300 MG tablet Take 300 mg  by mouth 2 (two) times daily.     lisinopril (ZESTRIL) 10 MG tablet Take 1 tablet (10 mg total) by mouth daily. 30 tablet 0   nitroGLYCERIN (NITROSTAT) 0.4 MG SL tablet Place 1 tablet (0.4 mg total) under the tongue every 5 (five) minutes as needed for chest pain. 20 tablet 12   UNABLE TO FIND Take by mouth as needed. Med Name: OTC Acid Reflux medication     Current Facility-Administered Medications on File Prior to Visit  Medication Dose Route Frequency Provider Last Rate Last Admin   sodium chloride flush (NS) 0.9 % injection 3 mL  3 mL Intravenous Q12H Laurier NancyKhan, Shaukat A, MD        No Known Allergies  Physical exam:  Today's Vitals   01/10/22 0824  BP: 118/73  Pulse: 77  Weight: 239 lb 8 oz (108.6 kg)  Height: 5\' 6"  (1.676 m)   Body mass index is 38.66 kg/m.   Wt Readings from Last 3 Encounters:  01/10/22 239 lb 8 oz (108.6 kg)  09/02/21 232 lb (105.2 kg)  06/22/21 222 lb (100.7 kg)     Ht Readings from Last 3 Encounters:  01/10/22 5\' 6"  (1.676 m)  09/02/21 5\' 6"  (1.676 m)  06/22/21 5\' 6"  (1.676 m)      General: The patient is awake, alert and appears not in acute distress. The patient is well groomed. Head: Normocephalic, atraumatic. Neck is supple. Mallampati 3,  neck circumference:18 inches . Nasal airflow feels restricted , but is  patent.   Retrognathia is seen. I liked to avoid a FFM.  Dental status: irregular  Cardiovascular:  Regular rate and cardiac rhythm by pulse,  without distended neck veins. Respiratory: Lungs are clear to auscultation.  Skin:  Without evidence of ankle edema, or rash. Trunk: The patient's posture is erect.   Neurologic exam : The patient is awake and alert, oriented to place and time.   Memory subjective described as intact.  Attention span & concentration ability appears normal.  Speech is fluent,  without dysarthria, dysphonia or aphasia.  Mood and affect are appropriate.    Cranial nerves: no loss of smell or taste reported   Pupils are equal and briskly reactive to light. Funduscopic exam deferred. .  Extraocular movements in vertical and horizontal planes were intact and without nystagmus. No Diplopia. Visual fields by finger perimetry are intact. Reports floaters. Hearing was intact to soft voice and finger rubbing.   Facial sensation intact to fine touch.  Facial motor strength is symmetric and tongue and uvula move midline.  Neck ROM : rotation, tilt and flexion extension were normal for age and shoulder shrug was symmetrical.    Motor exam:  Symmetric bulk, tone and ROM.   Normal tone without cog -wheeling, symmetric grip strength .   Sensory:  Fine touch and vibration were tested  and  normal.  Proprioception tested in the upper extremities was normal.  Coordination: Rapid alternating movements in the fingers/hands were of normal speed.  The Finger-to-nose maneuver was intact without evidence of ataxia, dysmetria or tremor.   Gait and station: Patient could rise unassisted from a seated position, walked without assistive device.  Stance is of normal width/ base.  Toe and heel walk were deferred.  Deep tendon reflexes: in the  upper and lower extremities are symmetrically attenuated.  Babinski response was deferred.       After spending a total time of  35  minutes face to face and additional time for physical and neurologic examination, review of laboratory studies,  personal review of imaging studies, reports and results of other testing and review of referral information / records as far as provided in visit, I have established the following assessments:  1) SEVERE complex sleep apnea,  at baseline dominantly OBSTRUCTIVE in HST, became  CENTRAL/ complex sleep apnea under CPAP, BiPAP and BiPAP ST.  Responded to ASV, 16/ 6/ over 5 cm water     My Plan is to proceed with:  1) continue ASV. Patient needs to be refitted for a mask, he reports air leak into the eyes, and has severe RETROGNATHIA,  believes however, that he is a nose breather- can ADAPT address the side sleeping issue- I need him to avoid supine sleep!   I would like to thank Laurier Nancy, MD and Dr Pearlean Brownie for allowing me to meet with and to take care of this pleasant patient.   In short, Travis Williams is presenting with high severity CSA and has responded favorably to ASV. With high compliance and subjective improvement of ESS, but not Fatigue score- he has gained weight, needs to address his exercise regimen with [employer] and [employer] .   NP McCue will follow up in the sleep clinic setting within 12 months.   CC: I will share my notes with Dr Pearlean Brownie, MD .  Electronically signed by: Melvyn Novas, MD 01/10/2022 8:41 AM  [facility] and [facility] Board certified by The [employer] of Sleep Medicine and Diplomate of the [employer] of Sleep Medicine. Board certified In Neurology through the ABPN, Fellow of the [employer] of Neurology. Medical Director of [facility].

## 2022-01-17 ENCOUNTER — Telehealth: Payer: Self-pay

## 2022-04-11 DIAGNOSIS — I739 Peripheral vascular disease, unspecified: Secondary | ICD-10-CM | POA: Diagnosis not present

## 2022-04-11 DIAGNOSIS — E669 Obesity, unspecified: Secondary | ICD-10-CM | POA: Diagnosis not present

## 2022-04-11 DIAGNOSIS — I10 Essential (primary) hypertension: Secondary | ICD-10-CM | POA: Diagnosis not present

## 2022-04-11 DIAGNOSIS — E782 Mixed hyperlipidemia: Secondary | ICD-10-CM | POA: Diagnosis not present

## 2022-04-11 DIAGNOSIS — I2510 Atherosclerotic heart disease of native coronary artery without angina pectoris: Secondary | ICD-10-CM | POA: Diagnosis not present

## 2022-04-11 DIAGNOSIS — E1151 Type 2 diabetes mellitus with diabetic peripheral angiopathy without gangrene: Secondary | ICD-10-CM | POA: Diagnosis not present

## 2022-04-27 ENCOUNTER — Other Ambulatory Visit: Payer: Self-pay | Admitting: Cardiovascular Disease

## 2022-04-27 DIAGNOSIS — E782 Mixed hyperlipidemia: Secondary | ICD-10-CM

## 2022-04-27 DIAGNOSIS — I10 Essential (primary) hypertension: Secondary | ICD-10-CM

## 2022-05-05 ENCOUNTER — Other Ambulatory Visit: Payer: Self-pay | Admitting: Cardiovascular Disease

## 2022-05-05 DIAGNOSIS — I10 Essential (primary) hypertension: Secondary | ICD-10-CM

## 2022-06-13 ENCOUNTER — Other Ambulatory Visit: Payer: Self-pay | Admitting: Cardiovascular Disease

## 2022-06-13 DIAGNOSIS — I10 Essential (primary) hypertension: Secondary | ICD-10-CM

## 2022-08-29 ENCOUNTER — Ambulatory Visit (INDEPENDENT_AMBULATORY_CARE_PROVIDER_SITE_OTHER): Payer: Medicare HMO

## 2022-08-29 DIAGNOSIS — I10 Essential (primary) hypertension: Secondary | ICD-10-CM

## 2022-08-29 DIAGNOSIS — I351 Nonrheumatic aortic (valve) insufficiency: Secondary | ICD-10-CM | POA: Diagnosis not present

## 2022-08-29 DIAGNOSIS — I422 Other hypertrophic cardiomyopathy: Secondary | ICD-10-CM | POA: Diagnosis not present

## 2022-09-05 ENCOUNTER — Ambulatory Visit: Payer: Medicare HMO | Admitting: Cardiovascular Disease

## 2022-09-05 ENCOUNTER — Encounter: Payer: Self-pay | Admitting: Cardiovascular Disease

## 2022-09-05 VITALS — BP 140/70 | HR 77 | Ht 66.0 in | Wt 235.0 lb

## 2022-09-05 DIAGNOSIS — I10 Essential (primary) hypertension: Secondary | ICD-10-CM | POA: Diagnosis not present

## 2022-09-05 DIAGNOSIS — I25110 Atherosclerotic heart disease of native coronary artery with unstable angina pectoris: Secondary | ICD-10-CM

## 2022-09-05 DIAGNOSIS — I739 Peripheral vascular disease, unspecified: Secondary | ICD-10-CM

## 2022-09-05 DIAGNOSIS — G4733 Obstructive sleep apnea (adult) (pediatric): Secondary | ICD-10-CM | POA: Diagnosis not present

## 2022-09-05 DIAGNOSIS — I340 Nonrheumatic mitral (valve) insufficiency: Secondary | ICD-10-CM

## 2022-09-05 DIAGNOSIS — I651 Occlusion and stenosis of basilar artery: Secondary | ICD-10-CM | POA: Diagnosis not present

## 2022-09-05 DIAGNOSIS — I639 Cerebral infarction, unspecified: Secondary | ICD-10-CM

## 2022-09-05 NOTE — Progress Notes (Signed)
Cardiology Office Note   Date:  09/05/2022   ID:  Travis Williams, DOB 11-16-1955, MRN [id number]  PCP:  Laurier Nancy, MD  Cardiologist:  Adrian Blackwater, MD      History of Present Illness: Travis Williams is a 67 y.o. male who presents for  Chief Complaint  Patient presents with   Follow-up    Follow Up & ECHO Results    Doing well      Past Medical History:  Diagnosis Date   Hypertension    Stroke [facility])      Past Surgical History:  Procedure Laterality Date   CORONARY STENT INTERVENTION N/A 02/19/2021   Procedure: CORONARY STENT INTERVENTION;  Surgeon: Alwyn Pea, MD;  Location: [facility] INVASIVE CV LAB;  Service: Cardiovascular;  Laterality: N/A;   IR CT HEAD LTD  12/03/2020   IR INTRA CRAN STENT  12/03/2020   IR PERCUTANEOUS ART THROMBECTOMY/INFUSION INTRACRANIAL INC DIAG ANGIO  12/03/2020   IR RADIOLOGIST EVAL & MGMT  02/23/2021   IR US GUIDE VASC ACCESS RIGHT  12/03/2020   LEFT HEART CATH AND CORONARY ANGIOGRAPHY N/A 02/19/2021   Procedure: LEFT HEART CATH AND CORONARY ANGIOGRAPHY with intervention;  Surgeon: Laurier Nancy, MD;  Location: [facility] INVASIVE CV LAB;  Service: Cardiovascular;  Laterality: N/A;   RADIOLOGY WITH ANESTHESIA N/A 12/03/2020   Procedure: IR WITH ANESTHESIA;  Surgeon: Radiologist, Medication, MD;  Location: [facility] OR;  Service: Radiology;  Laterality: N/A;     Current Outpatient Medications  Medication Sig Dispense Refill   amLODipine (NORVASC) 10 MG tablet TAKE 1 TABLET BY MOUTH ONCE DAILY 90 tablet 1   aspirin 81 MG chewable tablet Chew 1 tablet (81 mg total) by mouth daily.     atorvastatin (LIPITOR) 40 MG tablet TAKE 1 TABLET BY MOUTH EVERY DAY 90 tablet 1   cilostazol (PLETAL) 100 MG tablet Take 100 mg by mouth 2 (two) times daily.     labetalol (NORMODYNE) 300 MG tablet TAKE 1 TABLET BY MOUTH TWICE DAILY 180 tablet 1   losartan-hydrochlorothiazide (HYZAAR) 50-12.5 MG tablet TAKE 1 TABLET BY MOUTH EVERY DAY 90 tablet 0   nitroGLYCERIN  (NITROSTAT) 0.4 MG SL tablet Place 1 tablet (0.4 mg total) under the tongue every 5 (five) minutes as needed for chest pain. 20 tablet 12   UNABLE TO FIND Take by mouth as needed. Med Name: OTC Acid Reflux medication     No current facility-administered medications for this visit.   Facility-Administered Medications Ordered in Other Visits  Medication Dose Route Frequency Provider Last Rate Last Admin   sodium chloride flush (NS) 0.9 % injection 3 mL  3 mL Intravenous Q12H Laurier Nancy, MD       ACTIVE PROBLEMS & CONDITIONS    Coronary Artery Disease    Esophageal Reflux    Hypertension Systemic     CHIEF COMPLAINT  The Chief Complaint is: 4 months follow up.     HISTORY OF PRESENT ILLNESS  Travis Williams is a 67 year old male.   Patient in office for routine cardiac exam. Denies chest pain, shortness of breath, dizziness.  Allergy list reviewed   Problem list reviewed   Medication list reviewed    No chest pain or discomfort   No palpitations  , and The heart rate was not fast    Not feeling congested in the chest   No dyspnea   No cough   No wheezing     CURRENT MEDICATION  amLODIPine Besylate 10 MG Oral Tablet TAKE 1 TABLET BY MOUTH ONCE DAILY, 90 days, 0 refills    Aspirin 81 MG Oral Tablet Delayed Release once a day 0 days, 0 refills    Atorvastatin Calcium 40 MG Oral Tablet TAKE 1 TABLET BY MOUTH EVERY DAY, 90 days, 0 refills    Labetalol HCl 300 MG Oral Tablet TAKE 1 TABLET BY MOUTH TWICE DAILY, 90 days, 0 refills    Losartan Potassium-HCTZ 50-12.5 MG Oral Tablet TAKE 1 TABLET BY MOUTH EVERY DAY, 90 days, 0 refills     PAST MEDICAL/SURGICAL HISTORY  Diagnoses:  Coronary artery disease Essential hypertension Intermittent claudication Peripheral vascular disease.   Hyperlipidemia Obesity  Morbid obesity.   Diabetes mellitus.   Ischemic stroke.   Nicotine dependence.   Nicotine dependence Procedural:   PTCA Had PCI, DES, of distal LAD, Occluded distal LCX  not treated, and 50% distal PDA, LVEF normal. Angio of legs inconclusive     SOCIAL HISTORY  Tobacco use:  Not a current smoker and smoking status: Former smoker. Alcohol: Not using alcohol. Habits: Poor exercise habits.     ALLERGIES    Lisinopril (Intolerance)     Reaction: Cough     REVIEW OF SYSTEMS  Systemic: Not feeling poorly (malaise).  No recent weight change. Head: No headache and no sinus pain. Cardiovascular: No chest pain or discomfort, no palpitations, and the heart rate was not fast. Pulmonary: No dyspnea, no cough, and no wheezing. Gastrointestinal: No heartburn.  No nausea, no vomiting, no abdominal pain, and no diarrhea. Neurological: No dizziness, no vertigo, and no fainting. Psychological: No anxiety and no depression.     PHYSICAL FINDINGS    Vitals taken 04/11/2022 09:55 am  BP-Sitting R  124/68 mmHg 100 - 120/60 - 80  Pulse Rate-Sitting  83 bpm 50 - 100  Temp-Tympanic  98.7 F 96 - 101  Height  66 in 64 - 74  Weight  240 lbs  123 - 215  Body Mass Index  38.7 kg/m2   Body Surface Area  2.2 m2   Oxygen Saturation  98 % 93 - 100    General Appearance:  In no acute distress. Lungs:  Clear to auscultation. Cardiovascular: Jugular Venous Distention:  JVD not increased.   JVD not increased. Heart Rate And Rhythm:  Normal.   Normal. Heart Sounds:  Normal. Murmurs:  No murmurs were heard. Carotid Arteries:  No bruit in the carotid artery. Arterial Pulses:  Equal bilaterally and normal. Edema:  Not present. Abdomen: Auscultation:  Bowel sounds were normal. Palpation:  Abdominal non-tender. Neurological:  Oriented to time, place, and person.     ASSESSMENT   Coronary artery disease  Systemic hypertension  Peripheral vascular disease  Hyperlipidemia  Obesity     THERAPY   Continue current medication.  Clinical summary provided to patient.     COUNSELING/EDUCATION   Education and counseling diet and exercise     PLAN      Essential (primary) hypertension RADIOLOGY/ULTRASOUND: Echo     Return to the clinic if condition worsens or new symptoms arise  Follow-up visit Patient doing well. No complaints.    Echo to check heart function.    RAeturn in 4 months.      Adrian Blackwater MD  Electronically signed by: Adrian Blackwater     Date: 04/11/2022 10:10  Allergies:   Patient has no known allergies.    Social History:   reports that he quit smoking about 27 years  ago. His smoking use included cigarettes. He has a 5.00 pack-year smoking history. He has never used smokeless tobacco. He reports that he does not currently use alcohol. He reports that he does not use drugs.   Family History:  family history includes Hypertension in his brother.    ROS:     Review of Systems  Constitutional: Negative.   HENT: Negative.    Eyes: Negative.   Respiratory: Negative.    Gastrointestinal: Negative.   Genitourinary: Negative.   Musculoskeletal: Negative.   Skin: Negative.   Neurological: Negative.   Endo/Heme/Allergies: Negative.   Psychiatric/Behavioral: Negative.    All other systems reviewed and are negative.     All other systems are reviewed and negative.    PHYSICAL EXAM: VS:  BP (!) 140/70   Pulse 77   Ht 5\' 6"  (1.676 m)   Wt 235 lb (106.6 kg)   SpO2 96%   BMI 37.93 kg/m  , BMI Body mass index is 37.93 kg/m. Last weight:  Wt Readings from Last 3 Encounters:  09/05/22 235 lb (106.6 kg)  01/10/22 239 lb 8 oz (108.6 kg)  09/02/21 232 lb (105.2 kg)     Physical Exam Vitals reviewed.  Constitutional:      Appearance: Normal appearance. He is normal weight.  HENT:     Head: Normocephalic.     Nose: Nose normal.     Mouth/Throat:     Mouth: Mucous membranes are moist.  Eyes:     Pupils: Pupils are equal, round, and reactive to light.  Cardiovascular:     Rate and Rhythm: Normal rate and regular rhythm.     Pulses: Normal pulses.     Heart sounds: Normal heart sounds.  Pulmonary:      Effort: Pulmonary effort is normal.  Abdominal:     General: Abdomen is flat. Bowel sounds are normal.  Musculoskeletal:        General: Normal range of motion.     Cervical back: Normal range of motion.  Skin:    General: Skin is warm.  Neurological:     General: No focal deficit present.     Mental Status: He is alert.  Psychiatric:        Mood and Affect: Mood normal.       EKG:   Recent Labs: No results found for requested labs within last 365 days.    Lipid Panel    Component Value Date/Time   CHOL 118 03/04/2021 1028   TRIG 101 03/04/2021 1028   HDL 35 (L) 03/04/2021 1028   CHOLHDL 3.4 03/04/2021 1028   CHOLHDL 5.1 12/06/2020 0317   VLDL 24 12/06/2020 0317   LDLCALC 64 03/04/2021 1028   LDLDIRECT UNABLE TO CALCULATE IF TRIGLYCERIDE IS >1293 mg/dL 16/12/9602 5409      Other studies Reviewed: Additional studies/ records that were reviewed today include:  Review of the above records demonstrates:   TESTS                                                                                          [facility] [address], [address]  [place], [place] [address] [phone] STUDY:  Gated Stress / Rest Myocardial Perfusion Imaging Tomographic (SPECT) Including attenuation correction Wall Motion, Left Ventricular Ejection Fraction By Gated Technique.Treadmill Stress Test. SEX: Male  WEIGHT: 220 lbs  HEIGHT: 66 in    ARMS UP: YES/NO                                                                        REFERRING PHYSICIAN: Dr.Coral Timme Welton Flakes                                                                                                                                                                                                                       INDICATION FOR STUDY: Other cerebral infarction                                                                                                                                                                                                                      TECHNIQUE:  Approximately 20 minutes following the intravenous administration of 10.3 mCi of Tc-64m Sestamibi after stress testing in a reclined supine position with arms above their head if able to do so, gated SPECT imaging of the heart was performed. After about a 2hr break, the  patient was injected intravenously with 30.0 mCi of Tc-62m Sestamibi.  Approximately 45 minutes later in the same position as stress imaging SPECT rest imaging of the heart was performed.  STRESS BY:  Adrian Blackwater, MD PROTOCOL:  Smitty Cords                                                                                        MAX PRED HR: 154                     85%: 131               75%: 116                                                                                                                   RESTING BP: 134/70  RESTING HR: 95 PEAK BP: 150/88 PEAK HR: 139 (90%)                                                                    EXERCISE DURATION: 4:00                                             METS: 5.8     REASON FOR TEST TERMINATION: Target reached/1 min post injection                                                                                                                                 SYMPTOMS: None  [facility] TREADMILL SCORE: 4                                       RISK:  Low                                                                                                                                                                                                            EKG RESULTS: NSR. 94/min. Frequent PVC's, No significant ST changes at peak exercise.                                                              IMAGE QUALITY: Good  PERFUSION/WALL MOTION FINDINGS: EF = 79%. Moderate size and intensity reversible basal inferolateral and anterolateral, mid inferolateral and anterolateral, and apex (17) wall defects, normal wall motion.                                                                           IMPRESSION:  Ischemia in the LCX territory with normal LVEF, consider further workup: Cath.                                                                                                                                                                                                                                                                                       Adrian Blackwater, MD Stress Interpreting Physician / Nuclear Interpreting Physician                         Adrian Blackwater MD  Electronically signed by: Adrian Blackwater     Date: 02/09/2021 09:43 REASON FOR VISIT  Visit for: Echocardiogram/Essential primaary hypertension  Sex:   Male        wt=  218  lbs.  BP= 182/104  Height=  66  inches.        TESTS  Imaging: Echocardiogram:  An echocardiogram in (2-d) mode was performed and in Doppler mode with color flow velocity mapping was performed. The aortic valve cusps are abnormal 1.9   cm, flow velocity 1.4  m/s, and systolic calculated mean flow gradient 4  mmHg. Mitral valve diastolic peak flow velocity E 0.6   m/s and E/A ratio 0.7. Aortic root diameter 3   cm. The LVOT internal diameter 2.1  cm and flow velocity was abnormal 1.2   m/s. LV systolic dimension 3  cm, diastolic 4.3  cm, posterior wall thickness 1.3   cm, fractional shortening 29  %, and EF 64  %. IVS thickness 1.5  cm. LA dimension 2.9 cm  RIGHT atrium= 15.3  cm2. Mitral Valve =  Ea= 5.8  DT= 254 msec. Mitral Valve is Normal. Aortic Valve is Normal. Tricuspid Valve is Normal. Pulmonic Valve is Normal.     ASSESSMENT  Technically adequate study.   Ejection fraction-64  Left Ventricle- Normal size   Left Ventricle diastolic dysfunction grade-Grade 1 relaxation abnormality  Right Ventricle- Normal size and function  Normal right ventricular wall motion  Left Atrium-Mildly dilated  Right Atrium-Normal size  Aortic valve-Trivial Aortic valve calcification with No stenosis, No regurgitation  Pulmonic Valve-No stenosis, no regurgitation  Mitral Valve-No regurgitation  Tricuspid Valve-No Regurgitation  Trivial pericardial effusion without hemodynamic compromise or tamponade.     THERAPY   Referring physician: Laurier Nancy  Sonographer: Lenor Derrick.      Adrian Blackwater MD  Electronically signed by: Adrian Blackwater     Date: 10/19/2020 13:06     No data to display            ASSESSMENT AND PLAN:    ICD-10-CM   1. Coronary artery disease involving native coronary artery of native heart with unstable angina pectoris (HCC)  I25.110 MYOCARDIAL PERFUSION IMAGING   Had PCI/Stent in distal LAD, occluded LCX not treated and 50% PDA disease on 02/19/21    2. Essential hypertension  I10 MYOCARDIAL PERFUSION IMAGING   ECHO had LVEF normal, mild MR    3. 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 PVD (peripheral vascular disease) (HCC)  I73.9 MYOCARDIAL PERFUSION IMAGING    6. OSA (obstructive sleep apnea)  G47.33 MYOCARDIAL PERFUSION IMAGING    7. 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    Total time spent: 30 minutes  Signed,  Adrian Blackwater, MD  09/05/2022 10:05 AM    [facility]

## 2022-09-09 ENCOUNTER — Other Ambulatory Visit: Payer: Self-pay | Admitting: Cardiovascular Disease

## 2022-09-09 DIAGNOSIS — I10 Essential (primary) hypertension: Secondary | ICD-10-CM

## 2022-09-12 ENCOUNTER — Encounter: Payer: Self-pay | Admitting: Neurology

## 2022-09-12 ENCOUNTER — Ambulatory Visit (INDEPENDENT_AMBULATORY_CARE_PROVIDER_SITE_OTHER): Payer: Medicare HMO | Admitting: Neurology

## 2022-09-12 VITALS — BP 120/71 | HR 70 | Ht 66.0 in | Wt 241.0 lb

## 2022-09-12 DIAGNOSIS — I6300 Cerebral infarction due to thrombosis of unspecified precerebral artery: Secondary | ICD-10-CM

## 2022-09-12 NOTE — Progress Notes (Signed)
[facility] [address] [place]. [place] [address] [phone]       OFFICE FOLLOW-UP NOTE  Travis Williams Date of Birth:  10-Aug-1955 Medical Record Number:  [id number]   HPI:  Last visit 03/04/2021: Travis Williams is a 67 year old African-American male seen today for initial office follow-up visit following hospital admission for stroke in September 2022.  History is obtained from the patient and review of electronic medical records and opossum reviewed pertinent available imaging films in PACS.Travis Williams is a 67 y.o. male who only has PMH known of HTN.  He presented to [facility] 12/03/20 with R hemiplegia and slurred speech. NIHSS 16. Last known well at 1030 and found on floor at 1230CT head was negative. 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 He was given TNKase and then required intubation. 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 IR team, Dr. Loreta Ave, emergently consulted and patient was emergently taken to IR and underwent successful mechanical thrombectomy with rescue stent placement.  He was admitted to the ICU blood pressure was tightly controlled.  He was extubated and did well.  MRI scan showed scattered bilateral punctate cerebellar, brainstem and occipital lobe infarcts.  2D echo showed ejection fraction of 70 to 75%.  Cardiac monitoring did not show significant arrhythmias.  LDL cholesterol 136 mg percent.  Hemoglobin A1c was 5.7.  He was started on aspirin and Brilinta.  His only mild deficits included dysarthria and right facial droop.  He was discharged home and has done well.  His speech is recovered completely back to normal and he is tolerating aspirin and Plavix now as he was unable to afford Brilinta.  He has minor bruising but no bleeding.  He has quit smoking completely as well as alcohol.  His blood pressure is under good control today it is 118/86 6.  He is tolerating Lipitor but does complain of some  muscle aches.  He has not had any follow-up lipid profile checked.  He is able to ambulate independently without assistance.  He feels he is neurologically back to his baseline and has no deficits remaining from his stroke.  He has no new complaints today. Update 09/02/2021 ; He returns for follow-up after last visit 6 months ago.  He states she is doing well.  She has had no recurrent stroke or TIA symptoms.  He remains on aspirin and Plavix which is tolerating well without bruising or bleeding.  He has gained some weight and he knows he needs to lose it off.  He had a home sleep study done on 07/19/2021 which showed severe sleep apnea.  If is following up with Dr. Vickey Huger for the same and needs to be scheduled to have CPAP titration.  He had lab work done at last visit on 02/22/2021 and LDL cholesterol is optimal at 64 mg percent.  He is tolerating Lipitor well without muscle aches and pains.  He is also on Pletal for peripheral vascular disease.  His blood pressure is well controlled today it is 121/73.  Update 09/12/2022 : He returns for follow-up after last visit 1 year ago.  He is doing well.  He has had no recurrent stroke or TIA symptoms.  He is tolerating aspirin well without bruising or bleeding.  He is tolerating Lipitor well without muscle aches and pains.  He does not know when he has his last lipid profile checked.  He does use a CPAP every night without fail.  He denies  any new recurrent neurological symptoms.  He is also not had any new health problems.  He has quit smoking since after his stroke.Marland KitchenHe had follow-up   carotid ultrasound on 09/15/2021 which showed no significant extracranial stenosis.  Transcranial Doppler study was also normal.  He has no new complaints today. ROS:   14 system review of systems is positive for dysarthria, slurred speech, weakness, hemiplegia, bruising, myalgias all other systems negative  PMH:  Past Medical History:  Diagnosis Date   Hypertension    Stroke [facility])      Social History:  Social History   Socioeconomic History   Marital status: Single    Spouse name: Not on file   Number of children: 2   Years of education: Not on file   Highest education level: High school graduate  Occupational History   Not on file  Tobacco Use   Smoking status: Former    Packs/day: 1.00    Years: 5.00    Additional pack years: 0.00    Total pack years: 5.00    Types: Cigarettes    Quit date: 92    Years since quitting: 27.4   Smokeless tobacco: Never  Vaping Use   Vaping Use: Never used  Substance and Sexual Activity   Alcohol use: Not Currently   Drug use: Never   Sexual activity: Not on file  Other Topics Concern   Not on file  Social History Narrative   Lives w roommate friend   Right handed   Caffeine: energy drink once a week   Social Determinants of Health   Financial Resource Strain: Not on file  Food Insecurity: No Food Insecurity (01/29/2021)   Hunger Vital Sign    Worried About Running Out of Food in the Last Year: Never true    Ran Out of Food in the Last Year: Never true  Transportation Needs: No Transportation Needs (06/30/2021)   PRAPARE - Administrator, Civil Service (Medical): No    Lack of Transportation (Non-Medical): No  Physical Activity: Not on file  Stress: Not on file  Social Connections: Not on file  Intimate Partner Violence: Not on file    Medications:   Current Outpatient Medications on File Prior to Visit  Medication Sig Dispense Refill   amLODipine (NORVASC) 10 MG tablet TAKE 1 TABLET BY MOUTH ONCE DAILY 90 tablet 1   aspirin 81 MG chewable tablet Chew 1 tablet (81 mg total) by mouth daily.     atorvastatin (LIPITOR) 40 MG tablet TAKE 1 TABLET BY MOUTH EVERY DAY 90 tablet 1   cilostazol (PLETAL) 100 MG tablet Take 100 mg by mouth 2 (two) times daily.     labetalol (NORMODYNE) 300 MG tablet TAKE 1 TABLET BY MOUTH TWICE DAILY 180 tablet 1   losartan-hydrochlorothiazide (HYZAAR) 50-12.5 MG  tablet TAKE 1 TABLET BY MOUTH EVERY DAY 90 tablet 0   nitroGLYCERIN (NITROSTAT) 0.4 MG SL tablet Place 1 tablet (0.4 mg total) under the tongue every 5 (five) minutes as needed for chest pain. 20 tablet 12   UNABLE TO FIND Take by mouth as needed. Med Name: OTC Acid Reflux medication     Current Facility-Administered Medications on File Prior to Visit  Medication Dose Route Frequency Provider Last Rate Last Admin   sodium chloride flush (NS) 0.9 % injection 3 mL  3 mL Intravenous Q12H Laurier Nancy, MD        Allergies:  No Known Allergies  Physical Exam General: Mildly  obese middle-aged African-American male, seated, in no evident distress Head: head normocephalic and atraumatic.  Neck: supple with no carotid or supraclavicular bruits Cardiovascular: regular rate and rhythm, no murmurs Musculoskeletal: no deformity Skin:  no rash/petichiae Vascular:  Normal pulses all extremities Vitals:   09/12/22 1314  BP: 120/71  Pulse: 70   Neurologic Exam Mental Status: Awake and fully alert. Oriented to place and time. Recent and remote memory intact. Attention span, concentration and fund of knowledge appropriate. Mood and affect appropriate.  Diminished recall 2/3.  Able to name 11 animals which can walk on 4 legs.  Clock drawing 4/4. Cranial Nerves: Fundoscopic exam not done. Pupils equal, briskly reactive to light. Extraocular movements full without nystagmus. Visual fields full to confrontation. Hearing intact. Facial sensation intact. Face, tongue, palate moves normally and symmetrically.  Motor: Normal bulk and tone. Normal strength in all tested extremity muscles. Sensory.: intact to touch ,pinprick .position and vibratory sensation.  Coordination: Rapid alternating movements normal in all extremities. Finger-to-nose and heel-to-shin performed accurately bilaterally. Gait and Station: Arises from chair without difficulty. Stance is normal. Gait demonstrates normal stride length and  balance . Able to heel, toe and tandem walk with only mild difficulty.  Reflexes: 1+ and symmetric. Toes downgoing.   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  Vascular risk factors of hypertension, hyperlipidemia, smoking and intracranial stenosis.  Patient has done amazingly well with practically no residual deficits or symptoms.     PLAN: I had a long d/w patient about his remote stroke, risk for recurrent stroke/TIAs, personally independently reviewed imaging studies and stroke evaluation results and answered questions.Continue  aspirin 81 mg daily   for secondary stroke prevention and maintain strict control of hypertension with blood pressure goal below 130/90, diabetes with hemoglobin A1c goal below 6.5% and lipids with LDL cholesterol goal below 70 mg/dL. I also advised the patient to eat a healthy diet with plenty of whole grains, cereals, fruits and vegetables, exercise regularly and maintain ideal body weight .follow-up for CPAP titration in the sleep lab.  Check screening carotid ultrasound . followup in the future with me in 1 year or call earlier if needed. Greater than 50% of time during this 35 minute visit was spent on counseling,explanation of diagnosis, planning of further management, discussion with patient and family and coordination of care Delia Heady, MD Note: This document was prepared with digital dictation and possible smart phrase technology. Any transcriptional errors that result from this process are unintentional

## 2022-09-12 NOTE — Patient Instructions (Signed)
had a long d/w patient about his remote stroke, risk for recurrent stroke/TIAs, personally independently reviewed imaging studies and stroke evaluation results and answered questions.Continue  aspirin 81 mg daily   for secondary stroke prevention and maintain strict control of hypertension with blood pressure goal below 130/90, diabetes with hemoglobin A1c goal below 6.5% and lipids with LDL cholesterol goal below 70 mg/dL. I also advised the patient to eat a healthy diet with plenty of whole grains, cereals, fruits and vegetables, exercise regularly and maintain ideal body weight .follow-up for CPAP titration in the sleep lab.  Check screening carotid ultrasound . followup in the future with me in 1 year or call earlier if needed.

## 2022-09-13 LAB — HEMOGLOBIN A1C
Est. average glucose Bld gHb Est-mCnc: 126 mg/dL
Hgb A1c MFr Bld: 6 % — ABNORMAL HIGH (ref 4.8–5.6)

## 2022-09-13 LAB — LIPID PANEL
Chol/HDL Ratio: 3.3 ratio (ref 0.0–5.0)
Cholesterol, Total: 148 mg/dL (ref 100–199)
HDL: 45 mg/dL (ref >39)
LDL Chol Calc (NIH): 87 mg/dL (ref 0–99)
Triglycerides: 81 mg/dL (ref 0–149)
VLDL Cholesterol Cal: 16 mg/dL (ref 5–40)

## 2022-09-16 ENCOUNTER — Other Ambulatory Visit: Payer: Self-pay | Admitting: Neurology

## 2022-09-16 DIAGNOSIS — E782 Mixed hyperlipidemia: Secondary | ICD-10-CM

## 2022-09-16 MED ORDER — ATORVASTATIN CALCIUM 40 MG PO TABS: 80.0000 mg | ORAL_TABLET | Freq: Every day | ORAL | 1 refills | Status: DC

## 2022-09-19 ENCOUNTER — Telehealth: Payer: Self-pay | Admitting: Anesthesiology

## 2022-09-19 NOTE — Telephone Encounter (Signed)
Called pt and informed of lab results, hemoglobin A1c is borderline, cholesterol levels are still elevated hence recommend increasing the dose of Lipitor to 80 mg daily. Rx for Lipitor 80 mg has been sent to pharmacy on file. Pt had no additional questions at this time but was encouraged to call back if questions arise.

## 2022-09-19 NOTE — Telephone Encounter (Signed)
-----   Message from Micki Riley, MD sent at 09/16/2022  8:42 AM EDT ----- Joneen Roach inform the patient that screening test for diabetes is borderline but acceptable.  Bad cholesterol levels are still elevated hence recommend increasing the dose of Lipitor to 80 mg daily.

## 2022-09-27 ENCOUNTER — Ambulatory Visit (INDEPENDENT_AMBULATORY_CARE_PROVIDER_SITE_OTHER): Payer: Medicare HMO

## 2022-09-27 DIAGNOSIS — I340 Nonrheumatic mitral (valve) insufficiency: Secondary | ICD-10-CM | POA: Diagnosis not present

## 2022-09-27 DIAGNOSIS — I639 Cerebral infarction, unspecified: Secondary | ICD-10-CM

## 2022-09-27 DIAGNOSIS — I25110 Atherosclerotic heart disease of native coronary artery with unstable angina pectoris: Secondary | ICD-10-CM

## 2022-09-27 DIAGNOSIS — I651 Occlusion and stenosis of basilar artery: Secondary | ICD-10-CM

## 2022-09-27 DIAGNOSIS — G4733 Obstructive sleep apnea (adult) (pediatric): Secondary | ICD-10-CM

## 2022-09-27 DIAGNOSIS — I739 Peripheral vascular disease, unspecified: Secondary | ICD-10-CM

## 2022-09-27 DIAGNOSIS — I10 Essential (primary) hypertension: Secondary | ICD-10-CM

## 2022-09-27 MED ORDER — TECHNETIUM TC 99M SESTAMIBI GENERIC - CARDIOLITE
31.0000 | Freq: Once | INTRAVENOUS | Status: AC | PRN
  Administered 2022-09-27: 31 via INTRAVENOUS

## 2022-09-27 MED ORDER — TECHNETIUM TC 99M SESTAMIBI GENERIC - CARDIOLITE
10.3000 | Freq: Once | INTRAVENOUS | Status: AC | PRN
  Administered 2022-09-27: 10.3 via INTRAVENOUS

## 2022-09-28 ENCOUNTER — Ambulatory Visit (HOSPITAL_COMMUNITY)
Admission: RE | Admit: 2022-09-28 | Discharge: 2022-09-28 | Disposition: A | Discharge status: Home / Self Care | Payer: Medicare HMO | Source: Ambulatory Visit | Attending: Neurology | Admitting: Neurology

## 2022-09-28 DIAGNOSIS — I6300 Cerebral infarction due to thrombosis of unspecified precerebral artery: Secondary | ICD-10-CM | POA: Insufficient documentation

## 2022-09-29 ENCOUNTER — Telehealth: Payer: Self-pay

## 2022-09-29 NOTE — Telephone Encounter (Signed)
Left masg to call back to obtain result

## 2022-09-29 NOTE — Telephone Encounter (Signed)
Pt called and LVM requesting a call back. Please advise.

## 2022-09-29 NOTE — Telephone Encounter (Signed)
-----   Message from Levert Feinstein sent at 09/29/2022  2:06 PM EDT ----- Please call patient, ultrasound of carotid artery showed no significant stenosis, please keep current medications.

## 2022-09-29 NOTE — Telephone Encounter (Signed)
Called the pt back and reviewed the results with him. Pt verbalized understanding. Pt had no questions at this time but was encouraged to call back if questions arise.

## 2022-10-03 ENCOUNTER — Encounter: Payer: Self-pay | Admitting: Cardiovascular Disease

## 2022-10-03 ENCOUNTER — Ambulatory Visit: Payer: Medicare HMO | Admitting: Cardiovascular Disease

## 2022-10-03 VITALS — BP 150/78 | HR 69 | Ht 66.0 in | Wt 240.0 lb

## 2022-10-03 DIAGNOSIS — G4733 Obstructive sleep apnea (adult) (pediatric): Secondary | ICD-10-CM

## 2022-10-03 DIAGNOSIS — E782 Mixed hyperlipidemia: Secondary | ICD-10-CM

## 2022-10-03 DIAGNOSIS — I25110 Atherosclerotic heart disease of native coronary artery with unstable angina pectoris: Secondary | ICD-10-CM | POA: Diagnosis not present

## 2022-10-03 DIAGNOSIS — I651 Occlusion and stenosis of basilar artery: Secondary | ICD-10-CM | POA: Diagnosis not present

## 2022-10-03 DIAGNOSIS — I639 Cerebral infarction, unspecified: Secondary | ICD-10-CM | POA: Diagnosis not present

## 2022-10-03 DIAGNOSIS — I10 Essential (primary) hypertension: Secondary | ICD-10-CM

## 2022-10-03 NOTE — Progress Notes (Signed)
Cardiology Office Note   Date:  10/03/2022   ID:  Travis Williams, DOB Jun 06, 1955, MRN [id number]  PCP:  Laurier Nancy, MD  Cardiologist:  Adrian Blackwater, MD      History of Present Illness: Travis Williams is a 67 y.o. male who presents for  Chief Complaint  Patient presents with   Follow-up    Follow Up & NST Results    Feeling fine, BP high as was rushing in AM      Past Medical History:  Diagnosis Date   Hypertension    Stroke [facility])      Past Surgical History:  Procedure Laterality Date   CORONARY STENT INTERVENTION N/A 02/19/2021   Procedure: CORONARY STENT INTERVENTION;  Surgeon: Alwyn Pea, MD;  Location: [facility] INVASIVE CV LAB;  Service: Cardiovascular;  Laterality: N/A;   IR CT HEAD LTD  12/03/2020   IR INTRA CRAN STENT  12/03/2020   IR PERCUTANEOUS ART THROMBECTOMY/INFUSION INTRACRANIAL INC DIAG ANGIO  12/03/2020   IR RADIOLOGIST EVAL & MGMT  02/23/2021   IR US GUIDE VASC ACCESS RIGHT  12/03/2020   LEFT HEART CATH AND CORONARY ANGIOGRAPHY N/A 02/19/2021   Procedure: LEFT HEART CATH AND CORONARY ANGIOGRAPHY with intervention;  Surgeon: Laurier Nancy, MD;  Location: [facility] INVASIVE CV LAB;  Service: Cardiovascular;  Laterality: N/A;   RADIOLOGY WITH ANESTHESIA N/A 12/03/2020   Procedure: IR WITH ANESTHESIA;  Surgeon: Radiologist, Medication, MD;  Location: [facility] OR;  Service: Radiology;  Laterality: N/A;     Current Outpatient Medications  Medication Sig Dispense Refill   amLODipine (NORVASC) 10 MG tablet TAKE 1 TABLET BY MOUTH ONCE DAILY 90 tablet 1   aspirin 81 MG chewable tablet Chew 1 tablet (81 mg total) by mouth daily.     atorvastatin (LIPITOR) 40 MG tablet Take 2 tablets (80 mg total) by mouth daily. 90 tablet 1   cilostazol (PLETAL) 100 MG tablet Take 100 mg by mouth 2 (two) times daily.     labetalol (NORMODYNE) 300 MG tablet TAKE 1 TABLET BY MOUTH TWICE DAILY 180 tablet 1   losartan-hydrochlorothiazide (HYZAAR) 50-12.5 MG tablet TAKE 1 TABLET BY MOUTH  EVERY DAY 90 tablet 0   nitroGLYCERIN (NITROSTAT) 0.4 MG SL tablet Place 1 tablet (0.4 mg total) under the tongue every 5 (five) minutes as needed for chest pain. 20 tablet 12   UNABLE TO FIND Take by mouth as needed. Med Name: OTC Acid Reflux medication     No current facility-administered medications for this visit.   Facility-Administered Medications Ordered in Other Visits  Medication Dose Route Frequency Provider Last Rate Last Admin   sodium chloride flush (NS) 0.9 % injection 3 mL  3 mL Intravenous Q12H Laurier Nancy, MD        Allergies:   Patient has no known allergies.    Social History:   reports that he quit smoking about 27 years ago. His smoking use included cigarettes. He started smoking about 32 years ago. He has a 5 pack-year smoking history. He has never used smokeless tobacco. He reports that he does not currently use alcohol. He reports that he does not use drugs.   Family History:  family history includes Hypertension in his brother.    ROS:     Review of Systems  Constitutional: Negative.   HENT: Negative.    Eyes: Negative.   Respiratory: Negative.    Gastrointestinal: Negative.   Genitourinary: Negative.   Musculoskeletal: Negative.   Skin: Negative.  Neurological: Negative.   Endo/Heme/Allergies: Negative.   Psychiatric/Behavioral: Negative.    All other systems reviewed and are negative.     All other systems are reviewed and negative.    PHYSICAL EXAM: VS:  BP (!) 150/78   Pulse 69   Ht 5\' 6"  (1.676 m)   Wt 240 lb (108.9 kg)   SpO2 98%   BMI 38.74 kg/m  , BMI Body mass index is 38.74 kg/m. Last weight:  Wt Readings from Last 3 Encounters:  10/03/22 240 lb (108.9 kg)  09/12/22 241 lb (109.3 kg)  09/05/22 235 lb (106.6 kg)     Physical Exam Vitals reviewed.  Constitutional:      Appearance: Normal appearance. He is normal weight.  HENT:     Head: Normocephalic.     Nose: Nose normal.     Mouth/Throat:     Mouth: Mucous  membranes are moist.  Eyes:     Pupils: Pupils are equal, round, and reactive to light.  Cardiovascular:     Rate and Rhythm: Normal rate and regular rhythm.     Pulses: Normal pulses.     Heart sounds: Normal heart sounds.  Pulmonary:     Effort: Pulmonary effort is normal.  Abdominal:     General: Abdomen is flat. Bowel sounds are normal.  Musculoskeletal:        General: Normal range of motion.     Cervical back: Normal range of motion.  Skin:    General: Skin is warm.  Neurological:     General: No focal deficit present.     Mental Status: He is alert.  Psychiatric:        Mood and Affect: Mood normal.       EKG:   Recent Labs: No results found for requested labs within last 365 days.    Lipid Panel    Component Value Date/Time   CHOL 148 09/12/2022 1341   TRIG 81 09/12/2022 1341   HDL 45 09/12/2022 1341   CHOLHDL 3.3 09/12/2022 1341   CHOLHDL 5.1 12/06/2020 0317   VLDL 24 12/06/2020 0317   LDLCALC 87 09/12/2022 1341   LDLDIRECT UNABLE TO CALCULATE IF TRIGLYCERIDE IS >1293 mg/dL 16/12/9602 5409      Other studies Reviewed: Additional studies/ records that were reviewed today include:  Review of the above records demonstrates:       No data to display            ASSESSMENT AND PLAN:    ICD-10-CM   1. Coronary artery disease involving native coronary artery of native heart with unstable angina pectoris (HCC)  I25.110    stress test equivacal, but asymptomaic, will treat medically. Had PCI/STENTING LAD AND OCCLUDED LCX.    2. Essential hypertension  I10    REPEAT BP 130/60    3. 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 OSA (obstructive sleep apnea)  G47.33     6. Mixed hyperlipidemia  E78.2        Problem List Items Addressed This Visit       Cardiovascular and Mediastinum   Ischemic stroke (HCC)   Essential hypertension   CAD (coronary artery disease) - Primary   Occlusion of  basilar artery due to embolism     Respiratory   OSA (obstructive sleep apnea)     Other   HLD (hyperlipidemia)  Disposition:   Return in about 2 months (around 12/04/2022).    Total time spent: 30 minutes  Signed,  Adrian Blackwater, MD  10/03/2022 9:50 AM    [facility]

## 2022-10-07 NOTE — Progress Notes (Signed)
Kindly inform the patient that carotid ultrasound study shows no significant narrowing of either carotid arteries in the neck on both sides.

## 2022-11-02 ENCOUNTER — Other Ambulatory Visit: Payer: Self-pay | Admitting: Cardiovascular Disease

## 2022-11-02 DIAGNOSIS — I10 Essential (primary) hypertension: Secondary | ICD-10-CM

## 2022-12-06 ENCOUNTER — Encounter: Payer: Self-pay | Admitting: Cardiovascular Disease

## 2022-12-06 ENCOUNTER — Ambulatory Visit: Payer: Medicare HMO | Admitting: Cardiovascular Disease

## 2022-12-06 VITALS — BP 140/85 | HR 79 | Ht 66.0 in | Wt 243.8 lb

## 2022-12-06 DIAGNOSIS — G4733 Obstructive sleep apnea (adult) (pediatric): Secondary | ICD-10-CM | POA: Diagnosis not present

## 2022-12-06 DIAGNOSIS — I739 Peripheral vascular disease, unspecified: Secondary | ICD-10-CM | POA: Diagnosis not present

## 2022-12-06 DIAGNOSIS — E782 Mixed hyperlipidemia: Secondary | ICD-10-CM

## 2022-12-06 DIAGNOSIS — I639 Cerebral infarction, unspecified: Secondary | ICD-10-CM

## 2022-12-06 DIAGNOSIS — I651 Occlusion and stenosis of basilar artery: Secondary | ICD-10-CM

## 2022-12-06 DIAGNOSIS — I25110 Atherosclerotic heart disease of native coronary artery with unstable angina pectoris: Secondary | ICD-10-CM | POA: Diagnosis not present

## 2022-12-06 DIAGNOSIS — I10 Essential (primary) hypertension: Secondary | ICD-10-CM | POA: Diagnosis not present

## 2022-12-06 MED ORDER — LOSARTAN POTASSIUM-HCTZ 100-25 MG PO TABS: 1.0000 | ORAL_TABLET | Freq: Every day | ORAL | 11 refills | Status: DC

## 2022-12-06 NOTE — Progress Notes (Signed)
Cardiology Office Note   Date:  12/06/2022   ID:  Travis Williams, DOB November 05, 1955, MRN [id number]  PCP:  Travis Nancy, MD  Cardiologist:  Travis Blackwater, MD      History of Present Illness: Travis Williams is a 67 y.o. male who presents for  Chief Complaint  Patient presents with   Follow-up    2 mo    Feeling fine      Past Medical History:  Diagnosis Date   Hypertension    Stroke Travis Williams)      Past Surgical History:  Procedure Laterality Date   CORONARY STENT INTERVENTION N/A 02/19/2021   Procedure: CORONARY STENT INTERVENTION;  Surgeon: Alwyn Pea, MD;  Location: [facility];  Service: Cardiovascular;  Laterality: N/A;   IR CT HEAD LTD  12/03/2020   IR INTRA CRAN STENT  12/03/2020   IR PERCUTANEOUS ART THROMBECTOMY/INFUSION INTRACRANIAL INC DIAG ANGIO  12/03/2020   IR RADIOLOGIST EVAL & MGMT  02/23/2021   IR US GUIDE VASC ACCESS RIGHT  12/03/2020   LEFT HEART CATH AND CORONARY ANGIOGRAPHY N/A 02/19/2021   Procedure: LEFT HEART CATH AND CORONARY ANGIOGRAPHY with intervention;  Surgeon: Travis Nancy, MD;  Location: [facility];  Service: Cardiovascular;  Laterality: N/A;   RADIOLOGY WITH ANESTHESIA N/A 12/03/2020   Procedure: IR WITH ANESTHESIA;  Surgeon: Radiologist, Medication, MD;  Location: [facility];  Service: Radiology;  Laterality: N/A;     Current Outpatient Medications  Medication Sig Dispense Refill   losartan-hydrochlorothiazide (HYZAAR) 100-25 MG tablet Take 1 tablet by mouth daily. 30 tablet 11   amLODipine (NORVASC) 10 MG tablet TAKE 1 TABLET BY MOUTH DAILY 90 tablet 1   aspirin 81 MG chewable tablet Chew 1 tablet (81 mg total) by mouth daily.     atorvastatin (LIPITOR) 40 MG tablet Take 2 tablets (80 mg total) by mouth daily. 90 tablet 1   cilostazol (PLETAL) 100 MG tablet Take 100 mg by mouth 2 (two) times daily.     labetalol (NORMODYNE) 300 MG tablet TAKE 1 TABLET BY MOUTH TWICE DAILY 180 tablet 1   nitroGLYCERIN (NITROSTAT) 0.4  MG SL tablet Place 1 tablet (0.4 mg total) under the tongue every 5 (five) minutes as needed for chest pain. 20 tablet 12   UNABLE TO FIND Take by mouth as needed. Med Name: OTC Acid Reflux medication     No current facility-administered medications for this visit.   Facility-Administered Medications Ordered in Other Visits  Medication Dose Route Frequency Provider Last Rate Last Admin   sodium chloride flush (NS) 0.9 % injection 3 mL  3 mL Intravenous Q12H Travis Nancy, MD        Allergies:   Patient has no known allergies.    Social History:   reports that he quit smoking about 27 years ago. His smoking use included cigarettes. He started smoking about 32 years ago. He has a 5 pack-year smoking history. He has never used smokeless tobacco. He reports that he does not currently use alcohol. He reports that he does not use drugs.   Family History:  family history includes Hypertension in his brother.    ROS:     Review of Systems  Constitutional: Negative.   HENT: Negative.    Eyes: Negative.   Respiratory: Negative.    Gastrointestinal: Negative.   Genitourinary: Negative.   Musculoskeletal: Negative.   Skin: Negative.   Neurological: Negative.   Endo/Heme/Allergies: Negative.   Psychiatric/Behavioral: Negative.  All other systems reviewed and are negative.     All other systems are reviewed and negative.    PHYSICAL EXAM: VS:  BP (!) 140/85   Pulse 79   Ht 5\' 6"  (1.676 m)   Wt 243 lb 12.8 oz (110.6 kg)   SpO2 98%   BMI 39.35 kg/m  , BMI Body mass index is 39.35 kg/m. Last weight:  Wt Readings from Last 3 Encounters:  12/06/22 243 lb 12.8 oz (110.6 kg)  10/03/22 240 lb (108.9 kg)  09/12/22 241 lb (109.3 kg)     Physical Exam Vitals reviewed.  Constitutional:      Appearance: Normal appearance. He is normal weight.  HENT:     Head: Normocephalic.     Nose: Nose normal.     Mouth/Throat:     Mouth: Mucous membranes are moist.  Eyes:     Pupils:  Pupils are equal, round, and reactive to light.  Cardiovascular:     Rate and Rhythm: Normal rate and regular rhythm.     Pulses: Normal pulses.     Heart sounds: Normal heart sounds.  Pulmonary:     Effort: Pulmonary effort is normal.  Abdominal:     General: Abdomen is flat. Bowel sounds are normal.  Musculoskeletal:        General: Normal range of motion.     Cervical back: Normal range of motion.  Skin:    General: Skin is warm.  Neurological:     General: No focal deficit present.     Mental Status: He is alert.  Psychiatric:        Mood and Affect: Mood normal.       EKG:   Recent Labs: No results found for requested labs within last 365 days.    Lipid Panel    Component Value Date/Time   CHOL 148 09/12/2022 1341   TRIG 81 09/12/2022 1341   HDL 45 09/12/2022 1341   CHOLHDL 3.3 09/12/2022 1341   CHOLHDL 5.1 12/06/2020 0317   VLDL 24 12/06/2020 0317   LDLCALC 87 09/12/2022 1341   LDLDIRECT UNABLE TO CALCULATE IF TRIGLYCERIDE IS >1293 mg/dL 40/98/1191 4782      Other studies Reviewed: Additional studies/ records that were reviewed today include:  Review of the above records demonstrates:       No data to display            ASSESSMENT AND PLAN:    ICD-10-CM   1. Essential hypertension  I10 losartan-hydrochlorothiazide (HYZAAR) 100-25 MG tablet   Will change hyzaar 100/25 as bp elevated    2. 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 Ischemic stroke (HCC)  I63.9 losartan-hydrochlorothiazide (HYZAAR) 100-25 MG tablet    5. Coronary artery disease involving native coronary artery of native heart with unstable angina pectoris (HCC)  I25.110 losartan-hydrochlorothiazide (HYZAAR) 100-25 MG tablet    6. OSA (obstructive sleep apnea)  G47.33 losartan-hydrochlorothiazide (HYZAAR)  100-25 MG tablet    7. Mixed hyperlipidemia  E78.2 losartan-hydrochlorothiazide (HYZAAR) 100-25 MG tablet       Problem List Items Addressed This Visit       Cardiovascular and Mediastinum   Ischemic stroke (HCC)   Relevant Medications   losartan-hydrochlorothiazide (HYZAAR) 100-25 MG tablet   Essential hypertension - Primary   Relevant Medications   losartan-hydrochlorothiazide (HYZAAR) 100-25 MG tablet  PVD (peripheral vascular disease) (HCC)   Relevant Medications   losartan-hydrochlorothiazide (HYZAAR) 100-25 MG tablet   CAD (coronary artery disease)   Relevant Medications   losartan-hydrochlorothiazide (HYZAAR) 100-25 MG tablet   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    Total time spent: 30 minutes  Signed,  Travis Blackwater, MD  12/06/2022 9:47 AM    [facility]

## 2022-12-26 ENCOUNTER — Other Ambulatory Visit: Payer: Self-pay | Admitting: Neurology

## 2022-12-26 DIAGNOSIS — E782 Mixed hyperlipidemia: Secondary | ICD-10-CM

## 2023-01-10 ENCOUNTER — Telehealth: Payer: Self-pay | Admitting: Neurology

## 2023-01-10 NOTE — Telephone Encounter (Signed)
Travis Williams, it looks like this is his yearly CPAP f/u from seeing Dr. Vickey Huger in October 2023. I don't believe Dr. Pearlean Brownie addressed anything about his CPAP. Let me know if you'd still like [place] to cancel!

## 2023-01-10 NOTE — Telephone Encounter (Signed)
Patient saw Dr. Pearlean Brownie in June 2024 advised to follow-up with Dr. Pearlean Brownie in 1 year (appointment scheduled June 2025).  I do not think he needs to see me tomorrow, currently on my schedule in the morning.  Please call, cancel appointment if not needed.  Thanks

## 2023-01-11 ENCOUNTER — Encounter: Payer: Self-pay | Admitting: Neurology

## 2023-01-11 ENCOUNTER — Ambulatory Visit: Payer: Medicare HMO | Admitting: Neurology

## 2023-01-11 ENCOUNTER — Telehealth: Payer: Self-pay

## 2023-01-11 VITALS — BP 109/57 | HR 78 | Ht 66.0 in | Wt 242.6 lb

## 2023-01-11 DIAGNOSIS — G4730 Sleep apnea, unspecified: Secondary | ICD-10-CM | POA: Diagnosis not present

## 2023-01-11 NOTE — Patient Instructions (Signed)
Great to meet you today.  Continue superb compliance with sleep apnea machine.  We will continue current settings.  Please use nightly for minimum of 4 hours.  I will send an order to your DME for supplies.  See back in 1 year.  Thanks!!

## 2023-01-11 NOTE — Telephone Encounter (Signed)
-----   Message from Glean Salvo sent at 01/11/2023  9:26 AM EDT ----- Order to DME to continue current settings on ASV send supplies as needed.

## 2023-01-11 NOTE — Telephone Encounter (Signed)
Community message sent via epic for cpap orders

## 2023-01-11 NOTE — Telephone Encounter (Signed)
Community msg sent via epic to order supplies for cpap

## 2023-01-11 NOTE — Progress Notes (Signed)
Patient: Travis Williams Date of Birth: 04-16-1955  Reason for Visit: Follow up History from: Patient Primary Neurologist: Dohmeier  ASSESSMENT AND PLAN 67 y.o. year old male   1.  Severe complex sleep apnea.  At baseline dominantly obstructive HST, became central/complex sleep apnea under CPAP, BiPAP and BiPAP ST.  Responded to ASV 16/6/5 cmH2O. On machine since Aug 2023.  -We will continue current settings for ASV.  He will continue nightly usage for minimum of 4 hours.  Will send an order to DME for supplies as needed.  Will follow-up in 1 year for sleep revisit.  HISTORY OF PRESENT ILLNESS: Today 01/11/23 Here today for sleep follow-up.  Doing excellent on ASV.  Has superb compliance 29/30 days at 97%, greater than 4 hours 97%.  Average usage days used 7 hours 46 minutes.  On ASV, EPAP 6 cm, Min PS 6cm/Max PS 13 cm.  Leak 17.5.  Using nasal pillow mask.  AHI 2.8.  He loves his machine.  He sleeps great.  He no longer wakes up during the night.  Wakes up with good energy during the day.  He has no issues with machine or mask.  He is quite pleased.  He is dedicated to nightly usage.  ESS 2.  HISTORY  Dr. Vickey Huger 01-10-2022: Travis Williams is a 67 y.o.  African American male patient seen upon referral by Dr Pearlean Brownie, MD , on 01/10/2022 . Chief concern according to patient :  Stroke in 11-2020, treated at [facility], Followed by Dr Pearlean Brownie, MD. . 67 year old male presents with acute basilar occlusion for mechanical thrombectomyLarge vessel stroke,  ischemic.  Also unstable angina.  I have the pleasure of seeing Travis Williams today as a follow up visit this patient had undergone several sleep studies the first 1 was a home sleep test 5 WatchPAT performed on 21 Jul 2021.  That showed a very severe sleep apnea with an AHI of sixteen 9.6.  Forced to simplify other rounds is up to 70 his REM pAHI was 73 and his non-REM sleep AHI was 69 so there was no significant difference between REM and non-REM sleep there was a  much greater AHI when the patient slept supine the AHI was 102 for those on the left side associated with an AHI of 28 point he did have about 20 minutes of sustained desaturations and his nadir was 75% there was complex apnea noted so these were not all obstructive events and for that reason I asked for an in lab titration.  This followed on 09 September 2021 and beginning at 5 cm water the patient's CPAP was titrated to 18 cm for central apneas became abundant and obstructive apnea was still not controlled.  He was switched to BiPAP starting at 18/14 cmH2O and finally to ST the respiratory tracking of 12/min.     The final pressure was an ST of 24/20 with an ST rate of 12 cm water, but it still had significant residual apnea.  For that reason he return for an AV adapted servo ventilation.  This was performed on 7-18/23 patient's AHI was finally significantly reduced this an ASV of 13 and EPAP of 6 cm of water and a medium size Simplus mask a Fisher Paykel fullface model was applied.  I am sorry the ASV was initiated with a minimum pressure support of 6 cm water,  maximum pressure support of 13 cm water ,  and an EPAP of 6 cm water the patient reached an AHI of 1.1/h.  Today,  I see the compliance report and Travis Williams has used the machine 100% of days and 100% of those for 4 hours or more with an average of almost 7 hours of sleep.  The settings were quoted above he is now set to an EPAP of 6 and minimum pressure support of 6, maximum pressure support of 13 cm water and has a residual apnea index of 3.7 but there is still 7.7 hypopneas.  There is some significant air leakage and I would suggest to try to refit the mask I believe that the air leakage leads to an artificially high AHI reading.     Subjective benefit : very severe Apnea, still sleeping on his back with a FFM- he was set up by adapt .   only one bathroom break , down from 3 times before ASV.   One power nap in the afternoons, 30 minutes.   Sleeping 6.5 hours now, before treatment 4-6 hours. No morning headaches.    BMI is up ! From 35.8 pre HST to now 38.6.  Not prediabetic.    GDS is 4 / 15. FSS is 51/ 63.   How likely are you to doze in the following situations: 0 = not likely, 1 = slight chance, 2 = moderate chance, 3 = high chance   Sitting and Reading?1 Watching Television? 3 Sitting inactive in a public place (theater or meeting)?2 Lying down in the afternoon when circumstances permit?3 Sitting and talking to someone?1 Sitting quietly after lunch without alcohol?3 In a car, while stopped for a few minutes in traffic? 0 As a passenger in a car for an hour without a break?1   Total = 12/ 24 points.   REVIEW OF SYSTEMS: Out of a complete 14 system review of symptoms, the patient complains only of the following symptoms, and all other reviewed systems are negative.  See HPI  ALLERGIES: No Known Allergies  HOME MEDICATIONS: Outpatient Medications Prior to Visit  Medication Sig Dispense Refill   amLODipine (NORVASC) 10 MG tablet TAKE 1 TABLET BY MOUTH DAILY 90 tablet 1   aspirin 81 MG chewable tablet Chew 1 tablet (81 mg total) by mouth daily.     atorvastatin (LIPITOR) 40 MG tablet Take 2 tablets (80 mg total) by mouth daily. 90 tablet 1   cilostazol (PLETAL) 100 MG tablet Take 100 mg by mouth 2 (two) times daily.     labetalol (NORMODYNE) 300 MG tablet TAKE 1 TABLET BY MOUTH TWICE DAILY 180 tablet 1   losartan-hydrochlorothiazide (HYZAAR) 100-25 MG tablet Take 1 tablet by mouth daily. 30 tablet 11   nitroGLYCERIN (NITROSTAT) 0.4 MG SL tablet Place 1 tablet (0.4 mg total) under the tongue every 5 (five) minutes as needed for chest pain. 20 tablet 12   UNABLE TO FIND Take by mouth as needed. Med Name: OTC Acid Reflux medication     Facility-Administered Medications Prior to Visit  Medication Dose Route Frequency Provider Last Rate Last Admin   sodium chloride flush (NS) 0.9 % injection 3 mL  3 mL  Intravenous Q12H Laurier Nancy, MD        PAST MEDICAL HISTORY: Past Medical History:  Diagnosis Date   Hypertension    Stroke [facility])     PAST SURGICAL HISTORY: Past Surgical History:  Procedure Laterality Date   CORONARY STENT INTERVENTION N/A 02/19/2021   Procedure: CORONARY STENT INTERVENTION;  Surgeon: Alwyn Pea, MD;  Location: [facility];  Service: Cardiovascular;  Laterality: N/A;  IR CT HEAD LTD  12/03/2020   IR INTRA CRAN STENT  12/03/2020   IR PERCUTANEOUS ART THROMBECTOMY/INFUSION INTRACRANIAL INC DIAG ANGIO  12/03/2020   IR RADIOLOGIST EVAL & MGMT  02/23/2021   IR US GUIDE VASC ACCESS RIGHT  12/03/2020   LEFT HEART CATH AND CORONARY ANGIOGRAPHY N/A 02/19/2021   Procedure: LEFT HEART CATH AND CORONARY ANGIOGRAPHY with intervention;  Surgeon: Laurier Nancy, MD;  Location: [facility];  Service: Cardiovascular;  Laterality: N/A;   RADIOLOGY WITH ANESTHESIA N/A 12/03/2020   Procedure: IR WITH ANESTHESIA;  Surgeon: Radiologist, Medication, MD;  Location: [facility] OR;  Service: Radiology;  Laterality: N/A;    FAMILY HISTORY: Family History  Problem Relation Age of Onset   Hypertension Brother     SOCIAL HISTORY: Social History   Socioeconomic History   Marital status: Single    Spouse name: Not on file   Number of children: 2   Years of education: Not on file   Highest education level: High school graduate  Occupational History   Not on file  Tobacco Use   Smoking status: Former    Current packs/day: 0.00    Average packs/day: 1 pack/day for 5.0 years (5.0 ttl pk-yrs)    Types: Cigarettes    Start date: 54    Quit date: 47    Years since quitting: 27.8   Smokeless tobacco: Never  Vaping Use   Vaping status: Never Used  Substance and Sexual Activity   Alcohol use: Not Currently   Drug use: Never   Sexual activity: Not on file  Other Topics Concern   Not on file  Social History Narrative   Lives w roommate friend   Right handed    Caffeine: energy drink once a week   Social Determinants of Health   Financial Resource Strain: Not on file  Food Insecurity: No Food Insecurity (01/29/2021)   Hunger Vital Sign    Worried About Running Out of Food in the Last Year: Never true    Ran Out of Food in the Last Year: Never true  Transportation Needs: No Transportation Needs (06/30/2021)   PRAPARE - Administrator, Civil Service (Medical): No    Lack of Transportation (Non-Medical): No  Physical Activity: Not on file  Stress: Not on file  Social Connections: Not on file  Intimate Partner Violence: Not on file   PHYSICAL EXAM  Vitals:   01/11/23 0844  BP: (!) 109/57  Pulse: 78  Weight: 242 lb 9.6 oz (110 kg)  Height: 5\' 6"  (1.676 m)   Body mass index is 39.16 kg/m.  Generalized: Well developed, in no acute distress  Neurological examination  Mentation: Alert oriented to time, place, history taking. Follows all commands speech and language fluent Cranial nerve II-XII: Pupils were equal round reactive to light. Extraocular movements were full, visual field were full on confrontational test. Facial sensation and strength were normal.  Head turning and shoulder shrug  were normal and symmetric. Motor: The motor testing reveals 5 over 5 strength of all 4 extremities. Good symmetric motor tone is noted throughout.  Gait and station: Gait is normal.   DIAGNOSTIC DATA (LABS, IMAGING, TESTING) - I reviewed patient records, labs, notes, testing and imaging myself where available.  Lab Results  Component Value Date   WBC 8.6 02/20/2021   HGB 9.7 (L) 02/20/2021   HCT 31.1 (L) 02/20/2021   MCV 87.6 02/20/2021   PLT 255 02/20/2021  Component Value Date/Time   NA 136 02/20/2021 0456   K 4.0 02/20/2021 0456   CL 105 02/20/2021 0456   CO2 23 02/20/2021 0456   GLUCOSE 111 (H) 02/20/2021 0456   BUN 12 02/20/2021 0456   CREATININE 0.88 02/20/2021 0456   CALCIUM 8.4 (L) 02/20/2021 0456   PROT RESULTS  UNAVAILABLE DUE TO INTERFERING SUBSTANCE 12/04/2020 0516   ALBUMIN 3.2 (L) 12/04/2020 0516   AST RESULTS UNAVAILABLE DUE TO INTERFERING SUBSTANCE 12/04/2020 0516   ALT RESULTS UNAVAILABLE DUE TO INTERFERING SUBSTANCE 12/04/2020 0516   ALKPHOS 59 12/04/2020 0516   BILITOT 2.6 (H) 12/04/2020 0516   GFRNONAA >60 02/20/2021 0456   Lab Results  Component Value Date   CHOL 148 09/12/2022   HDL 45 09/12/2022   LDLCALC 87 09/12/2022   LDLDIRECT UNABLE TO CALCULATE IF TRIGLYCERIDE IS >1293 mg/dL 29/52/8413   TRIG 81 24/40/1027   CHOLHDL 3.3 09/12/2022   Lab Results  Component Value Date   HGBA1C 6.0 (H) 09/12/2022   No results found for: "VITAMINB12" No results found for: "TSH"  Margie Ege, AGNP-C, DNP 01/11/2023, 9:23 AM [facility] [address], [address] [place], [place] [address] [phone]

## 2023-01-29 ENCOUNTER — Other Ambulatory Visit: Payer: Self-pay | Admitting: Neurology

## 2023-01-29 DIAGNOSIS — E782 Mixed hyperlipidemia: Secondary | ICD-10-CM

## 2023-01-31 NOTE — Telephone Encounter (Signed)
Rx refilled. Appropriate per physician documentation.

## 2023-03-07 ENCOUNTER — Encounter: Payer: Self-pay | Admitting: Cardiovascular Disease

## 2023-03-07 ENCOUNTER — Ambulatory Visit: Payer: Medicare HMO | Admitting: Cardiovascular Disease

## 2023-03-07 VITALS — BP 140/76 | HR 87 | Ht 66.0 in | Wt 247.2 lb

## 2023-03-07 DIAGNOSIS — I651 Occlusion and stenosis of basilar artery: Secondary | ICD-10-CM | POA: Diagnosis not present

## 2023-03-07 DIAGNOSIS — I25110 Atherosclerotic heart disease of native coronary artery with unstable angina pectoris: Secondary | ICD-10-CM | POA: Diagnosis not present

## 2023-03-07 DIAGNOSIS — I10 Essential (primary) hypertension: Secondary | ICD-10-CM

## 2023-03-07 DIAGNOSIS — I739 Peripheral vascular disease, unspecified: Secondary | ICD-10-CM | POA: Diagnosis not present

## 2023-03-07 MED ORDER — SILDENAFIL CITRATE 50 MG PO TABS: 25.0000 mg | ORAL_TABLET | ORAL | 0 refills | Status: DC | PRN

## 2023-03-07 MED ORDER — AMLODIPINE BESYLATE 5 MG PO TABS: 5.0000 mg | ORAL_TABLET | Freq: Every day | ORAL | 11 refills | Status: DC

## 2023-03-07 NOTE — Addendum Note (Signed)
Addended by: Adrian Blackwater A on: 03/07/2023 09:34 AM   Modules accepted: Orders

## 2023-03-07 NOTE — Progress Notes (Signed)
Cardiology Office Note   Date:  03/07/2023   ID:  Travis Williams, DOB [birth year], MRN [id number]  PCP:  Laurier Nancy, MD  Cardiologist:  Adrian Blackwater, MD      History of Present Illness: Travis Williams is a 67 y.o. male who presents for  Chief Complaint  Patient presents with   Follow-up    3 month follow up    Occasionally gets dizzy laying back, all the time      Past Medical History:  Diagnosis Date   Hypertension    Stroke [facility])      Past Surgical History:  Procedure Laterality Date   CORONARY STENT INTERVENTION N/A 02/19/2021   Procedure: CORONARY STENT INTERVENTION;  Surgeon: Alwyn Pea, MD;  Location: [facility] INVASIVE CV LAB;  Service: Cardiovascular;  Laterality: N/A;   IR CT HEAD LTD  12/03/2020   IR INTRA CRAN STENT  12/03/2020   IR PERCUTANEOUS ART THROMBECTOMY/INFUSION INTRACRANIAL INC DIAG ANGIO  12/03/2020   IR RADIOLOGIST EVAL & MGMT  02/23/2021   IR US GUIDE VASC ACCESS RIGHT  12/03/2020   LEFT HEART CATH AND CORONARY ANGIOGRAPHY N/A 02/19/2021   Procedure: LEFT HEART CATH AND CORONARY ANGIOGRAPHY with intervention;  Surgeon: Laurier Nancy, MD;  Location: [facility] INVASIVE CV LAB;  Service: Cardiovascular;  Laterality: N/A;   RADIOLOGY WITH ANESTHESIA N/A 12/03/2020   Procedure: IR WITH ANESTHESIA;  Surgeon: Radiologist, Medication, MD;  Location: [facility] OR;  Service: Radiology;  Laterality: N/A;     Current Outpatient Medications  Medication Sig Dispense Refill   amLODipine (NORVASC) 5 MG tablet Take 1 tablet (5 mg total) by mouth daily. 30 tablet 11   aspirin 81 MG chewable tablet Chew 1 tablet (81 mg total) by mouth daily.     atorvastatin (LIPITOR) 40 MG tablet TAKE 2 TABLETS(80 MG) BY MOUTH DAILY 90 tablet 1   cilostazol (PLETAL) 100 MG tablet Take 100 mg by mouth 2 (two) times daily.     labetalol (NORMODYNE) 300 MG tablet TAKE 1 TABLET BY MOUTH TWICE DAILY 180 tablet 1   losartan-hydrochlorothiazide (HYZAAR) 100-25 MG tablet Take 1 tablet by  mouth daily. 30 tablet 11   UNABLE TO FIND Take by mouth as needed. Med Name: OTC Acid Reflux medication     nitroGLYCERIN (NITROSTAT) 0.4 MG SL tablet Place 1 tablet (0.4 mg total) under the tongue every 5 (five) minutes as needed for chest pain. (Patient not taking: Reported on 03/07/2023) 20 tablet 12   No current facility-administered medications for this visit.   Facility-Administered Medications Ordered in Other Visits  Medication Dose Route Frequency Provider Last Rate Last Admin   sodium chloride flush (NS) 0.9 % injection 3 mL  3 mL Intravenous Q12H Laurier Nancy, MD        Allergies:   Patient has no known allergies.    Social History:   reports that he quit smoking about 27 years ago. His smoking use included cigarettes. He started smoking about 32 years ago. He has a 5 pack-year smoking history. He has never used smokeless tobacco. He reports that he does not currently use alcohol. He reports that he does not use drugs.   Family History:  family history includes Hypertension in his brother.    ROS:     Review of Systems  Constitutional: Negative.   HENT: Negative.    Eyes: Negative.   Respiratory: Negative.    Gastrointestinal: Negative.   Genitourinary: Negative.   Musculoskeletal: Negative.  Skin: Negative.   Neurological: Negative.   Endo/Heme/Allergies: Negative.   Psychiatric/Behavioral: Negative.    All other systems reviewed and are negative.     All other systems are reviewed and negative.    PHYSICAL EXAM: VS:  BP (!) 140/76   Pulse 87   Ht 5\' 6"  (1.676 m)   Wt 247 lb 3.2 oz (112.1 kg)   SpO2 95%   BMI 39.90 kg/m  , BMI Body mass index is 39.9 kg/m. Last weight:  Wt Readings from Last 3 Encounters:  03/07/23 247 lb 3.2 oz (112.1 kg)  01/11/23 242 lb 9.6 oz (110 kg)  12/06/22 243 lb 12.8 oz (110.6 kg)     Physical Exam Vitals reviewed.  Constitutional:      Appearance: Normal appearance. He is normal weight.  HENT:     Head:  Normocephalic.     Nose: Nose normal.     Mouth/Throat:     Mouth: Mucous membranes are moist.  Eyes:     Pupils: Pupils are equal, round, and reactive to light.  Cardiovascular:     Rate and Rhythm: Normal rate and regular rhythm.     Pulses: Normal pulses.     Heart sounds: Normal heart sounds.  Pulmonary:     Effort: Pulmonary effort is normal.  Abdominal:     General: Abdomen is flat. Bowel sounds are normal.  Musculoskeletal:        General: Normal range of motion.     Cervical back: Normal range of motion.  Skin:    General: Skin is warm.  Neurological:     General: No focal deficit present.     Mental Status: He is alert.  Psychiatric:        Mood and Affect: Mood normal.       EKG:   Recent Labs: No results found for requested labs within last 365 days.    Lipid Panel    Component Value Date/Time   CHOL 148 09/12/2022 1341   TRIG 81 09/12/2022 1341   HDL 45 09/12/2022 1341   CHOLHDL 3.3 09/12/2022 1341   CHOLHDL 5.1 12/06/2020 0317   VLDL 24 12/06/2020 0317   LDLCALC 87 09/12/2022 1341   LDLDIRECT UNABLE TO CALCULATE IF TRIGLYCERIDE IS >1293 mg/dL 16/12/9602 5409      Other studies Reviewed: Additional studies/ records that were reviewed today include:  Review of the above records demonstrates:       No data to display            ASSESSMENT AND PLAN:    ICD-10-CM   1. PVD (peripheral vascular disease) (HCC)  I73.9 amLODipine (NORVASC) 5 MG tablet    2. Coronary artery disease involving native coronary artery of native heart with unstable angina pectoris (HCC)  I25.110 amLODipine (NORVASC) 5 MG tablet   stable, stress test equivacal, but no chest pains    3. Essential hypertension  I10 amLODipine (NORVASC) 5 MG tablet   Repeat BP 110/60, probably dizziness due to orthostasis, thus will decrease amlodapine to 5 mg daily    4. 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    Total time spent: 30 minutes  Signed,  Adrian Blackwater, MD  03/07/2023 9:32 AM    [facility]

## 2023-05-08 ENCOUNTER — Other Ambulatory Visit: Payer: Self-pay | Admitting: Neurology

## 2023-05-08 DIAGNOSIS — E782 Mixed hyperlipidemia: Secondary | ICD-10-CM

## 2023-05-09 ENCOUNTER — Other Ambulatory Visit: Payer: Self-pay | Admitting: Cardiovascular Disease

## 2023-05-09 DIAGNOSIS — I10 Essential (primary) hypertension: Secondary | ICD-10-CM

## 2023-05-09 NOTE — Telephone Encounter (Signed)
 Last seen on 09/12/22 Follow up scheduled on 09/12/23

## 2023-05-28 ENCOUNTER — Other Ambulatory Visit: Payer: Self-pay

## 2023-05-28 ENCOUNTER — Emergency Department

## 2023-05-28 ENCOUNTER — Inpatient Hospital Stay

## 2023-05-28 ENCOUNTER — Inpatient Hospital Stay
Admission: EM | Admit: 2023-05-28 | Discharge: 2023-05-30 | DRG: 163 | Disposition: A | Discharge status: Home / Self Care | Attending: Obstetrics and Gynecology | Admitting: Obstetrics and Gynecology

## 2023-05-28 DIAGNOSIS — Z1152 Encounter for screening for COVID-19: Secondary | ICD-10-CM

## 2023-05-28 DIAGNOSIS — Z6838 Body mass index (BMI) 38.0-38.9, adult: Secondary | ICD-10-CM

## 2023-05-28 DIAGNOSIS — Z79899 Other long term (current) drug therapy: Secondary | ICD-10-CM

## 2023-05-28 DIAGNOSIS — I2699 Other pulmonary embolism without acute cor pulmonale: Secondary | ICD-10-CM | POA: Diagnosis present

## 2023-05-28 DIAGNOSIS — K8020 Calculus of gallbladder without cholecystitis without obstruction: Secondary | ICD-10-CM | POA: Diagnosis not present

## 2023-05-28 DIAGNOSIS — I82442 Acute embolism and thrombosis of left tibial vein: Secondary | ICD-10-CM | POA: Diagnosis present

## 2023-05-28 DIAGNOSIS — I2692 Saddle embolus of pulmonary artery without acute cor pulmonale: Secondary | ICD-10-CM | POA: Diagnosis not present

## 2023-05-28 DIAGNOSIS — I82431 Acute embolism and thrombosis of right popliteal vein: Secondary | ICD-10-CM | POA: Diagnosis present

## 2023-05-28 DIAGNOSIS — I2510 Atherosclerotic heart disease of native coronary artery without angina pectoris: Secondary | ICD-10-CM | POA: Diagnosis present

## 2023-05-28 DIAGNOSIS — E7800 Pure hypercholesterolemia, unspecified: Secondary | ICD-10-CM | POA: Diagnosis present

## 2023-05-28 DIAGNOSIS — Z8249 Family history of ischemic heart disease and other diseases of the circulatory system: Secondary | ICD-10-CM

## 2023-05-28 DIAGNOSIS — Z7902 Long term (current) use of antithrombotics/antiplatelets: Secondary | ICD-10-CM

## 2023-05-28 DIAGNOSIS — I82412 Acute embolism and thrombosis of left femoral vein: Secondary | ICD-10-CM | POA: Diagnosis not present

## 2023-05-28 DIAGNOSIS — R109 Unspecified abdominal pain: Secondary | ICD-10-CM | POA: Diagnosis not present

## 2023-05-28 DIAGNOSIS — I824Y3 Acute embolism and thrombosis of unspecified deep veins of proximal lower extremity, bilateral: Secondary | ICD-10-CM

## 2023-05-28 DIAGNOSIS — G4733 Obstructive sleep apnea (adult) (pediatric): Secondary | ICD-10-CM | POA: Diagnosis present

## 2023-05-28 DIAGNOSIS — I10 Essential (primary) hypertension: Secondary | ICD-10-CM | POA: Diagnosis present

## 2023-05-28 DIAGNOSIS — R918 Other nonspecific abnormal finding of lung field: Secondary | ICD-10-CM | POA: Diagnosis not present

## 2023-05-28 DIAGNOSIS — Z8673 Personal history of transient ischemic attack (TIA), and cerebral infarction without residual deficits: Secondary | ICD-10-CM | POA: Diagnosis not present

## 2023-05-28 DIAGNOSIS — Z955 Presence of coronary angioplasty implant and graft: Secondary | ICD-10-CM

## 2023-05-28 DIAGNOSIS — J90 Pleural effusion, not elsewhere classified: Secondary | ICD-10-CM | POA: Diagnosis not present

## 2023-05-28 DIAGNOSIS — I82411 Acute embolism and thrombosis of right femoral vein: Secondary | ICD-10-CM | POA: Diagnosis present

## 2023-05-28 DIAGNOSIS — I82432 Acute embolism and thrombosis of left popliteal vein: Secondary | ICD-10-CM | POA: Diagnosis not present

## 2023-05-28 DIAGNOSIS — Z87891 Personal history of nicotine dependence: Secondary | ICD-10-CM

## 2023-05-28 DIAGNOSIS — I739 Peripheral vascular disease, unspecified: Secondary | ICD-10-CM | POA: Diagnosis present

## 2023-05-28 DIAGNOSIS — N179 Acute kidney failure, unspecified: Secondary | ICD-10-CM | POA: Diagnosis not present

## 2023-05-28 DIAGNOSIS — J9601 Acute respiratory failure with hypoxia: Secondary | ICD-10-CM | POA: Insufficient documentation

## 2023-05-28 DIAGNOSIS — Z7982 Long term (current) use of aspirin: Secondary | ICD-10-CM

## 2023-05-28 DIAGNOSIS — E876 Hypokalemia: Secondary | ICD-10-CM | POA: Diagnosis not present

## 2023-05-28 DIAGNOSIS — R0602 Shortness of breath: Secondary | ICD-10-CM | POA: Diagnosis not present

## 2023-05-28 DIAGNOSIS — D6859 Other primary thrombophilia: Secondary | ICD-10-CM | POA: Diagnosis not present

## 2023-05-28 DIAGNOSIS — I2602 Saddle embolus of pulmonary artery with acute cor pulmonale: Secondary | ICD-10-CM

## 2023-05-28 DIAGNOSIS — R7989 Other specified abnormal findings of blood chemistry: Secondary | ICD-10-CM | POA: Diagnosis not present

## 2023-05-28 LAB — CBC
HCT: 38.6 % — ABNORMAL LOW (ref 39.0–52.0)
Hemoglobin: 11.9 g/dL — ABNORMAL LOW (ref 13.0–17.0)
MCH: 25.6 pg — ABNORMAL LOW (ref 26.0–34.0)
MCHC: 30.8 g/dL (ref 30.0–36.0)
MCV: 83.2 fL (ref 80.0–100.0)
Platelets: 188 10*3/uL (ref 150–400)
RBC: 4.64 MIL/uL (ref 4.22–5.81)
RDW: 15.4 % (ref 11.5–15.5)
WBC: 10.4 10*3/uL (ref 4.0–10.5)
nRBC: 0 % (ref 0.0–0.2)

## 2023-05-28 LAB — HEPARIN LEVEL (UNFRACTIONATED): Heparin Unfractionated: 0.59 [IU]/mL (ref 0.30–0.70)

## 2023-05-28 LAB — BASIC METABOLIC PANEL
Anion gap: 8 (ref 5–15)
BUN: 22 mg/dL (ref 8–23)
CO2: 26 mmol/L (ref 22–32)
Calcium: 8.7 mg/dL — ABNORMAL LOW (ref 8.9–10.3)
Chloride: 100 mmol/L (ref 98–111)
Creatinine, Ser: 1.42 mg/dL — ABNORMAL HIGH (ref 0.61–1.24)
GFR, Estimated: 53 mL/min — ABNORMAL LOW (ref >60)
Glucose, Bld: 160 mg/dL — ABNORMAL HIGH (ref 70–99)
Potassium: 3.9 mmol/L (ref 3.5–5.1)
Sodium: 134 mmol/L — ABNORMAL LOW (ref 135–145)

## 2023-05-28 LAB — APTT: aPTT: 29 s (ref 24–36)

## 2023-05-28 LAB — MRSA NEXT GEN BY PCR, NASAL: MRSA by PCR Next Gen: NOT DETECTED

## 2023-05-28 LAB — PROTIME-INR
INR: 1.1 (ref 0.8–1.2)
Prothrombin Time: 14.6 s (ref 11.4–15.2)

## 2023-05-28 LAB — HIV ANTIBODY (ROUTINE TESTING W REFLEX): HIV Screen 4th Generation wRfx: NONREACTIVE

## 2023-05-28 LAB — TROPONIN I (HIGH SENSITIVITY)
Troponin I (High Sensitivity): 50 ng/L — ABNORMAL HIGH (ref <18)
Troponin I (High Sensitivity): 47 ng/L — ABNORMAL HIGH (ref <18)

## 2023-05-28 LAB — BRAIN NATRIURETIC PEPTIDE: B Natriuretic Peptide: 52.5 pg/mL (ref 0.0–100.0)

## 2023-05-28 LAB — GLUCOSE, CAPILLARY: Glucose-Capillary: 141 mg/dL — ABNORMAL HIGH (ref 70–99)

## 2023-05-28 LAB — LACTIC ACID, PLASMA
Lactic Acid, Venous: 1.5 mmol/L (ref 0.5–1.9)
Lactic Acid, Venous: 1.5 mmol/L (ref 0.5–1.9)

## 2023-05-28 LAB — RESP PANEL BY RT-PCR (RSV, FLU A&B, COVID)  RVPGX2
Influenza A by PCR: NEGATIVE
Influenza B by PCR: NEGATIVE
Resp Syncytial Virus by PCR: NEGATIVE
SARS Coronavirus 2 by RT PCR: NEGATIVE

## 2023-05-28 MED ORDER — FENTANYL CITRATE PF 50 MCG/ML IJ SOSY: 50.0000 ug | PREFILLED_SYRINGE | INTRAMUSCULAR | Status: DC | PRN

## 2023-05-28 MED ORDER — CHLORHEXIDINE GLUCONATE CLOTH 2 % EX PADS: 6.0000 | MEDICATED_PAD | Freq: Every day | CUTANEOUS | Status: DC

## 2023-05-28 MED ORDER — HEPARIN BOLUS VIA INFUSION
5300.0000 [IU] | Freq: Once | INTRAVENOUS | Status: AC
  Administered 2023-05-28: 5300 [IU] via INTRAVENOUS
  Filled 2023-05-28: qty 5300

## 2023-05-28 MED ORDER — ATORVASTATIN CALCIUM 20 MG PO TABS
40.0000 mg | ORAL_TABLET | Freq: Every day | ORAL | Status: DC
  Administered 2023-05-28 – 2023-05-30 (×3): 40 mg via ORAL
  Filled 2023-05-28 (×3): qty 2

## 2023-05-28 MED ORDER — SODIUM CHLORIDE 0.9 % IV BOLUS
500.0000 mL | Freq: Once | INTRAVENOUS | Status: AC
  Administered 2023-05-28: 500 mL via INTRAVENOUS

## 2023-05-28 MED ORDER — CHLORHEXIDINE GLUCONATE CLOTH 2 % EX PADS
6.0000 | MEDICATED_PAD | Freq: Every day | CUTANEOUS | Status: DC
  Administered 2023-05-28: 6 via TOPICAL

## 2023-05-28 MED ORDER — IOHEXOL 350 MG/ML SOLN
75.0000 mL | Freq: Once | INTRAVENOUS | Status: AC | PRN
  Administered 2023-05-28: 75 mL via INTRAVENOUS

## 2023-05-28 MED ORDER — HEPARIN (PORCINE) 25000 UT/250ML-% IV SOLN
1650.0000 [IU]/h | INTRAVENOUS | Status: DC
  Administered 2023-05-28 – 2023-05-29 (×3): 1500 [IU]/h via INTRAVENOUS
  Filled 2023-05-28 (×3): qty 250

## 2023-05-28 MED ORDER — LOSARTAN POTASSIUM-HCTZ 100-25 MG PO TABS: 1.0000 | ORAL_TABLET | Freq: Every day | ORAL | Status: DC

## 2023-05-28 MED ORDER — ACETAMINOPHEN 500 MG PO TABS
1000.0000 mg | ORAL_TABLET | Freq: Once | ORAL | Status: AC
  Administered 2023-05-28: 1000 mg via ORAL
  Filled 2023-05-28: qty 2

## 2023-05-28 MED ORDER — ASPIRIN 81 MG PO CHEW
81.0000 mg | CHEWABLE_TABLET | Freq: Every day | ORAL | Status: DC
  Administered 2023-05-28 – 2023-05-30 (×3): 81 mg via ORAL
  Filled 2023-05-28 (×3): qty 1

## 2023-05-28 MED ORDER — ORAL CARE MOUTH RINSE: 15.0000 mL | OROMUCOSAL | Status: DC | PRN

## 2023-05-28 MED ORDER — MORPHINE SULFATE (PF) 2 MG/ML IV SOLN
2.0000 mg | INTRAVENOUS | Status: DC | PRN
  Administered 2023-05-29: 2 mg via INTRAVENOUS
  Filled 2023-05-28: qty 1

## 2023-05-28 MED ORDER — LABETALOL HCL 200 MG PO TABS: 300.0000 mg | ORAL_TABLET | Freq: Two times a day (BID) | ORAL | Status: DC

## 2023-05-28 MED ORDER — AMLODIPINE BESYLATE 5 MG PO TABS: 5.0000 mg | ORAL_TABLET | Freq: Every day | ORAL | Status: DC

## 2023-05-28 MED ORDER — ACETAMINOPHEN 325 MG PO TABS
650.0000 mg | ORAL_TABLET | ORAL | Status: DC | PRN
  Administered 2023-05-28: 650 mg via ORAL
  Filled 2023-05-28: qty 2

## 2023-05-28 MED ORDER — CILOSTAZOL 100 MG PO TABS
100.0000 mg | ORAL_TABLET | Freq: Two times a day (BID) | ORAL | Status: DC
  Filled 2023-05-28 (×2): qty 1

## 2023-05-28 NOTE — ED Notes (Signed)
 Advised nurse that a patient has a ready bed

## 2023-05-28 NOTE — H&P (Addendum)
 History and Physical    Travis Williams WUJ:[id number] DOB: [birth year] DOA: 05/28/2023  PCP: Laurier Nancy, MD (Confirm with patient/family/NH records and if not entered, this has to be entered at [facility] point of entry) Patient coming from: Home  I have personally briefly reviewed patient's old medical records in [facility]  Chief Complaint: Chest pain  HPI: Travis Williams is a 68 y.o. male with medical history significant of Stroke with embolectomy, CAD status post stenting, HTN, HLD, presented with worsening of left-sided chest pain.  Symptoms started 2 days ago, patient started to be a sharp-like chest pain on the left side, worsening with deep breath.  Denied any cough no fever chills no nausea vomiting. ED Course: Temperature 99, pulse 117, blood pressure 140/80, O2 saturation 93% on room air.  CTA showed saddle PE.  Sodium 134, glucose 160, creatinine 1.4, hemoglobin 11.9, WBC 10.4.  Patient was started on heparin drip in ED.  Review of Systems: As per HPI otherwise 14 point review of systems negative.    Past Medical History:  Diagnosis Date   Hypertension    Stroke [facility])     Past Surgical History:  Procedure Laterality Date   CORONARY STENT INTERVENTION N/A 02/19/2021   Procedure: CORONARY STENT INTERVENTION;  Surgeon: Alwyn Pea, MD;  Location: [facility] INVASIVE CV LAB;  Service: Cardiovascular;  Laterality: N/A;   IR CT HEAD LTD  12/03/2020   IR INTRA CRAN STENT  12/03/2020   IR PERCUTANEOUS ART THROMBECTOMY/INFUSION INTRACRANIAL INC DIAG ANGIO  12/03/2020   IR RADIOLOGIST EVAL & MGMT  02/23/2021   IR US GUIDE VASC ACCESS RIGHT  12/03/2020   LEFT HEART CATH AND CORONARY ANGIOGRAPHY N/A 02/19/2021   Procedure: LEFT HEART CATH AND CORONARY ANGIOGRAPHY with intervention;  Surgeon: Laurier Nancy, MD;  Location: [facility] INVASIVE CV LAB;  Service: Cardiovascular;  Laterality: N/A;   RADIOLOGY WITH ANESTHESIA N/A 12/03/2020   Procedure: IR WITH ANESTHESIA;  Surgeon: Radiologist,  Medication, MD;  Location: [facility] OR;  Service: Radiology;  Laterality: N/A;     reports that he quit smoking about 28 years ago. His smoking use included cigarettes. He started smoking about 33 years ago. He has a 5 pack-year smoking history. He has never used smokeless tobacco. He reports that he does not currently use alcohol. He reports that he does not use drugs.  No Known Allergies  Family History  Problem Relation Age of Onset   Hypertension Brother      Prior to Admission medications   Medication Sig Start Date End Date Taking? Authorizing Provider  amLODipine (NORVASC) 5 MG tablet Take 1 tablet (5 mg total) by mouth daily. 03/07/23 03/06/24  Laurier Nancy, MD  aspirin 81 MG chewable tablet Chew 1 tablet (81 mg total) by mouth daily. 12/07/20   Bailey-Modzik, Delila A, NP  atorvastatin (LIPITOR) 40 MG tablet TAKE 2 TABLETS(80 MG) BY MOUTH DAILY 05/09/23   Micki Riley, MD  cilostazol (PLETAL) 100 MG tablet Take 100 mg by mouth 2 (two) times daily.    [provider]  labetalol (NORMODYNE) 300 MG tablet TAKE 1 TABLET BY MOUTH TWICE DAILY 05/09/23   Scoggins, Hospital doctor, NP  losartan-hydrochlorothiazide (HYZAAR) 100-25 MG tablet Take 1 tablet by mouth daily. 12/06/22 12/06/23  Laurier Nancy, MD  sildenafil (VIAGRA) 50 MG tablet Take 0.5 tablets (25 mg total) by mouth as needed for erectile dysfunction. 03/07/23 03/06/24  Laurier Nancy, MD  UNABLE TO FIND Take by mouth as needed.  Med Name: OTC Acid Reflux medication    [provider]    Physical Exam: Vitals:   05/28/23 0929 05/28/23 0930  BP:  (!) 141/88  Pulse:  (!) 117  Resp:  18  Temp:  99 F (37.2 C)  SpO2:  93%  Weight: 108.9 kg   Height: 5\' 6"  (1.676 m)     Constitutional: NAD, calm, comfortable Vitals:   05/28/23 0929 05/28/23 0930  BP:  (!) 141/88  Pulse:  (!) 117  Resp:  18  Temp:  99 F (37.2 C)  SpO2:  93%  Weight: 108.9 kg   Height: 5\' 6"  (1.676 m)    Eyes: PERRL, lids and conjunctivae  normal ENMT: Mucous membranes are moist. Posterior pharynx clear of any exudate or lesions.Normal dentition.  Neck: normal, supple, no masses, no thyromegaly Respiratory: clear to auscultation bilaterally, no wheezing, no crackles. Normal respiratory effort. No accessory muscle use.  Cardiovascular: Regular rate and rhythm, no murmurs / rubs / gallops. No extremity edema. 2+ pedal pulses. No carotid bruits.  Abdomen: no tenderness, no masses palpated. No hepatosplenomegaly. Bowel sounds positive.  Musculoskeletal: no clubbing / cyanosis. No joint deformity upper and lower extremities. Good ROM, no contractures. Normal muscle tone.  Skin: no rashes, lesions, ulcers. No induration Neurologic: CN 2-12 grossly intact. Sensation intact, DTR normal. Strength 5/5 in all 4.  Psychiatric: Normal judgment and insight. Alert and oriented x 3. Normal mood.     Labs on Admission: I have personally reviewed following labs and imaging studies  CBC: Recent Labs  Lab 05/28/23 1018  WBC 10.4  HGB 11.9*  HCT 38.6*  MCV 83.2  PLT 188   Basic Metabolic Panel: Recent Labs  Lab 05/28/23 1018  NA 134*  K 3.9  CL 100  CO2 26  GLUCOSE 160*  BUN 22  CREATININE 1.42*  CALCIUM 8.7*   GFR: Estimated Creatinine Clearance: 56.8 mL/min (A) (by C-G formula based on SCr of 1.42 mg/dL (H)). Liver Function Tests: No results for input(s): "AST", "ALT", "ALKPHOS", "BILITOT", "PROT", "ALBUMIN" in the last 168 hours. No results for input(s): "LIPASE", "AMYLASE" in the last 168 hours. No results for input(s): "AMMONIA" in the last 168 hours. Coagulation Profile: No results for input(s): "INR", "PROTIME" in the last 168 hours. Cardiac Enzymes: No results for input(s): "CKTOTAL", "CKMB", "CKMBINDEX", "TROPONINI" in the last 168 hours. BNP (last 3 results) No results for input(s): "PROBNP" in the last 8760 hours. HbA1C: No results for input(s): "HGBA1C" in the last 72 hours. CBG: No results for input(s):  "GLUCAP" in the last 168 hours. Lipid Profile: No results for input(s): "CHOL", "HDL", "LDLCALC", "TRIG", "CHOLHDL", "LDLDIRECT" in the last 72 hours. Thyroid Function Tests: No results for input(s): "TSH", "T4TOTAL", "FREET4", "T3FREE", "THYROIDAB" in the last 72 hours. Anemia Panel: No results for input(s): "VITAMINB12", "FOLATE", "FERRITIN", "TIBC", "IRON", "RETICCTPCT" in the last 72 hours. Urine analysis:    Component Value Date/Time   COLORURINE STRAW (A) 12/03/2020 1803   APPEARANCEUR CLEAR 12/03/2020 1803   LABSPEC 1.041 (H) 12/03/2020 1803   PHURINE 8.0 12/03/2020 1803   GLUCOSEU NEGATIVE 12/03/2020 1803   HGBUR NEGATIVE 12/03/2020 1803   BILIRUBINUR NEGATIVE 12/03/2020 1803   KETONESUR NEGATIVE 12/03/2020 1803   PROTEINUR NEGATIVE 12/03/2020 1803   NITRITE NEGATIVE 12/03/2020 1803   LEUKOCYTESUR NEGATIVE 12/03/2020 1803    Radiological Exams on Admission: CT Angio Chest PE W and/or Wo Contrast Result Date: 05/28/2023 CLINICAL DATA:  Pain EXAM: CT ANGIOGRAPHY CHEST WITH CONTRAST TECHNIQUE:  Multidetector CT imaging of the chest was performed using the standard protocol during bolus administration of intravenous contrast. Multiplanar CT image reconstructions and MIPs were obtained to evaluate the vascular anatomy. RADIATION DOSE REDUCTION: This exam was performed according to the departmental dose-optimization program which includes automated exposure control, adjustment of the mA and/or kV according to patient size and/or use of iterative reconstruction technique. CONTRAST:  75mL OMNIPAQUE IOHEXOL 350 MG/ML SOLN COMPARISON:  Chest x-ray 05/28/2023 FINDINGS: Cardiovascular: Thoracic aorta has a normal course and caliber with partially calcified atherosclerotic plaque. Is also some plaque extending along the great vessels particularly the left subclavian artery. Coronary artery calcifications are seen. The heart is nonenlarged. Question of some left ventricular wall hypertrophy. No  pericardial effusion. Bilateral pulmonary emboli are identified including a saddle embolus across the bifurcation of the main pulmonary artery. Significant areas of central bilateral lung emboli along the pulmonary arteries. Relatively large clot burden identified. There is slight flattening of the intraventricular septum. Please correlate for other clinical evidence of right heart strain. Mediastinum/Nodes: Large hiatal hernia. Patulous esophagus. Preserved thyroid gland. No specific abnormal lymph node enlargement identified in the axillary regions, hilum or mediastinum. Lungs/Pleura: Breathing motion identified. Parenchymal opacity seen towards inferior aspect of lingula and peripheral left lower lobe. There is also some hazy ground-glass in the left upper lobe. Tiny left pleural effusion. Upper Abdomen: Adrenal glands are preserved in the upper abdomen. Stones in the nondilated gallbladder at the edge of the imaging field. Musculoskeletal: Scattered degenerative changes of the spine. Bridging osteophytes. Schmorl's node changes. Gynecomastia. Critical Value/emergent results were called by telephone at the time of interpretation on 05/28/2023 at 11:58 am to provider Willy Eddy , who verbally acknowledged these results. Review of the MIP images confirms the above findings. IMPRESSION: Large clot burden pulmonary embolism with saddle embolus across the main pulmonary artery. Slight flattening of the intraventricular septum. Please correlate for clinical evidence of heart strain. Parenchymal opacity seen along the lingula and left lower lobe. Infiltrate versus infarct is in the differential. Recommend follow-up. Tiny left pleural effusion. Large hiatal hernia. Gallstones. Gynecomastia. Aortic Atherosclerosis (ICD10-I70.0). Electronically Signed   By: Karen Kays M.D.   On: 05/28/2023 12:03   DG Chest Portable 1 View Result Date: 05/28/2023 CLINICAL DATA:  Flank pain for 2 days. EXAM: PORTABLE CHEST 1 VIEW  COMPARISON:  X-ray 12/03/2020. FINDINGS: Underinflation. Borderline cardiopericardial silhouette with some prominent central vasculature. Subtle opacity seen at both lung bases. Infiltrate is possible. Possible tiny left effusion as well. No pneumothorax. Film is under penetrated. Degenerative changes of the spine. IMPRESSION: Underinflation with slight lung base opacities. Borderline size heart.  Prominent central vasculature. Recommend follow-up Electronically Signed   By: Karen Kays M.D.   On: 05/28/2023 10:02    EKG: Pending  Assessment/Plan Principal Problem:   Pulmonary emboli [facility]) Active Problems:   Acute pulmonary embolism (HCC)  (please populate well all problems here in Problem List. (For example, if patient is on BP meds at home and you resume or decide to hold them, it is a problem that needs to be her. Same for CAD, COPD, HLD and so on)  Saddle PE, unprovoked -D/W ICU attending, given there is no symptoms or signs of hemodynamic instability, no recommendation for TNK. -Close monitor vital signs for decompensation, admitted to PCU -Echocardiogram -DVT study -Continue heparin drip  AKI vs CKD -No urinary symptoms and he is euvolemic -Check FeNa.  HTN -As per recommendation from ICU attending, we will hold off  on home BP meds  CAD -Low suspicion for ACS.  Troponin and EKG pending  PVD -No acute concern, continue cilostazol and aspirin  DVT prophylaxis: Heparin drip Code Status: Full code Family Communication: None at bedside Disposition Plan: Patient is sick with saddle PE requiring parenteral anticoagulation, expect more than 2 midnight hospital stay Consults called: Critical care Admission status: PCU admit   Emeline General MD [facility] Pager [phone]  05/28/2023, 12:45 PM

## 2023-05-28 NOTE — ED Triage Notes (Signed)
 Pt comes with flank pain. Pt states this started 2 days ago. Pt state left side and it has gotten worse. Pt denies any nausea and vomiting. Pt denies any pain burning or pressure.

## 2023-05-28 NOTE — Consult Note (Signed)
 PHARMACY - ANTICOAGULATION CONSULT NOTE  Pharmacy Consult for Heparin  Indication: pulmonary embolus  No Known Allergies  Patient Measurements: Height: 5\' 6"  (167.6 cm) Weight: 108.9 kg (240 lb) IBW/kg (Calculated) : 63.8 Heparin Dosing Weight: 88.5 kg   Vital Signs: Temp: 99 F (37.2 C) (03/09 0930) BP: 141/88 (03/09 0930) Pulse Rate: 117 (03/09 0930)  Labs: Recent Labs    05/28/23 1018  HGB 11.9*  HCT 38.6*  PLT 188  CREATININE 1.42*   Estimated Creatinine Clearance: 56.8 mL/min (A) (by C-G formula based on SCr of 1.42 mg/dL (H)).  Medical History: Past Medical History:  Diagnosis Date   Hypertension    Stroke [facility])    Medications:  No PTA anticoagulation noted from chart review   Assessment: Travis Williams is a 68 year old male that presented with 2-3 days of worsening flank pain with associated SOB. CT angio chest shows a large clot burden pulmonary embolism with saddle embolus across the main pulmonary artery. From chart review, patient was not on any anticoagulation prior to admission. Pharmacy has been consulted for initiation and management of a heparin infusion. Baseline labs: Hgb 11.9, PLT 188, aPTT and INR have been ordered.   Goal of Therapy:  Heparin level 0.3-0.7 units/ml Monitor platelets by anticoagulation protocol: Yes   Plan:  Give 5300 unit bolus x 1  Start heparin infusion at 1500 units/hr  Check anti-Xa level 6 hours after initiation of infusion  Monitor CBC daily and for s/sx of bleeding   Littie Deeds, PharmD Pharmacy Resident  05/28/2023 11:55 AM

## 2023-05-28 NOTE — Consult Note (Signed)
 PHARMACY - ANTICOAGULATION CONSULT NOTE  Pharmacy Consult for Heparin  Indication: pulmonary embolus  No Known Allergies  Patient Measurements: Height: 5\' 6"  (167.6 cm) Weight: 108.9 kg (240 lb 1.3 oz) IBW/kg (Calculated) : 63.8 Heparin Dosing Weight: 88.5 kg   Vital Signs: Temp: 98.4 F (36.9 C) (03/09 1731) Temp Source: Oral (03/09 1731) BP: 132/80 (03/09 1800) Pulse Rate: 91 (03/09 1800)  Labs: Recent Labs    05/28/23 1018 05/28/23 1240 05/28/23 1604 05/28/23 1819  HGB 11.9*  --   --   --   HCT 38.6*  --   --   --   PLT 188  --   --   --   APTT  --  29  --   --   LABPROT  --  14.6  --   --   INR  --  1.1  --   --   HEPARINUNFRC  --   --   --  0.59  CREATININE 1.42*  --   --   --   TROPONINIHS 50*  --  47*  --    Estimated Creatinine Clearance: 56.8 mL/min (A) (by C-G formula based on SCr of 1.42 mg/dL (H)).  Medical History: Past Medical History:  Diagnosis Date   Hypertension    Stroke [facility])     Assessment: Travis Williams is a 68 y.o. male presenting with 2-3 days of worsening flank pain with associated SOB. PMH significant for ischemic stroke, UA, PVD, CAD, HTN, OSA. CT angio chest shows a large clot burden pulmonary embolism with saddle embolus across the main pulmonary artery. Patient was not on [facility] PTA per chart review. Pharmacy has been consulted to initiate and manage heparin infusion.   Baseline Labs: aPTT 29, PT 14.6, INR 1.1, Hgb 11.9, Hct 38.6, Plt 188   Goal of Therapy:  Heparin level 0.3-0.7 units/ml Monitor platelets by anticoagulation protocol: Yes   Date Time HL Rate/Comment  3/9 1819 0.59 1500/therapeutic x1  Plan:  Continue heparin infusion at 1500 units/hr Check HL in 6 hours  Continue to monitor H&H and platelets daily while on heparin infusion   Celene Squibb, PharmD Clinical Pharmacist 05/28/2023 6:43 PM

## 2023-05-28 NOTE — Consult Note (Addendum)
 NAME:  Travis Williams, MRN:  [id number], DOB:  27-Jul-1955, LOS: 0 ADMISSION DATE:  05/28/2023, CONSULTATION DATE:  05/28/2023 REFERRING MD:  Willy Eddy, MD, CHIEF COMPLAINT:  Pulmonary Embolism   History of Present Illness:   Patient is a pleasant 68 year old male with history of CAD and CVA presenting to the hospital with shortness of breath.  Patient's symptoms started on Friday, with onset of exertional dyspnea as well as left sided flank pain.  He was also endorsing some pleuritic chest pain.  He felt that he's unable to walk to his mailbox without stopping to catch his breath, something that is very unusual for him. Patient does not endorse any cough, sputum production, or hemoptysis. He did not have any dizziness nor any loss of consciousness.  On presentation to the ED, he was noted to be hypoxic, tachycardic, and hypertensive.  Blood work included a troponin that was mildly elevated.  Chest x-ray was normal.  Given high concern for pulm embolism, he underwent CTA that was notable for a saddle pulmonary embolus.  Pertinent  Medical History  -HTN -CVA -CAD  Objective   Blood pressure (!) 141/88, pulse (!) 117, temperature 99 F (37.2 C), resp. rate 18, height 5\' 6"  (1.676 m), weight 108.9 kg, SpO2 93%.       No intake or output data in the 24 hours ending 05/28/23 1221 Filed Weights   05/28/23 0929  Weight: 108.9 kg    Examination: Physical Exam Constitutional:      Appearance: Normal appearance.  Cardiovascular:     Rate and Rhythm: Normal rate and regular rhythm.     Pulses: Normal pulses.     Heart sounds: Normal heart sounds.  Pulmonary:     Effort: Pulmonary effort is normal.     Breath sounds: Normal breath sounds. No wheezing or rales.  Abdominal:     General: There is distension.  Musculoskeletal:     Right lower leg: No edema.     Left lower leg: No edema.  Neurological:     Mental Status: He is alert.      Assessment & Plan:   #Acute Pulmonary  Embolism #Acute Hypoxic Respiratory Failure #Possible Pulmonary Infarct  Pleasant 68 year old african [place] male with history of CVA and CAD, on aspirin, who presents to the hospital with shortness of breath and pleuritic chest pain. CTA notable for large saddle PE. PESI score is 79.  Patient is currently hemodynamically stable with normal blood pressure, though is mildly hypoxic. Workup has so far included a normal BNP and a high sensitivity troponin that is mildly elevated. Bedside cardiac POCUS doesn't show an enlarged RV or signs of RV strain, but this remains to be confirmed on TTE. Overall, the patient has PE with intermediate low risk features by 2019 ERS/ESC criteria. Would recommend continued conservative management with anti-coagulation. Would prefer enoxaparin over heparin, but this is limited by his AKI. No indication for thrombolysis or mechanical thrombectomy exists at this point in time. His symptoms of pleurisy are explained by pulmonary infarct from embolism for which management is expectant.  Finally, his SpO2 is 92-93%, which is likely an over-estimate given he is african [place]. I would recommend initiation of oxygen therapy and target SpO2 of >94%.(10.1056/[id number]).   -admit to progressive, close monitoring of BP and hemodynamics -Goal SpO2 > 94% -discontinue anti-HTN agents -TTE -anti-coagulation with heparin gtt given AKI -duplex lower extremities -PCCM to follow  Raechel Chute, MD [facility] 05/28/2023  1:13 PM    Labs   CBC: Recent Labs  Lab 05/28/23 1018  WBC 10.4  HGB 11.9*  HCT 38.6*  MCV 83.2  PLT 188    Basic Metabolic Panel: Recent Labs  Lab 05/28/23 1018  NA 134*  K 3.9  CL 100  CO2 26  GLUCOSE 160*  BUN 22  CREATININE 1.42*  CALCIUM 8.7*   GFR: Estimated Creatinine Clearance: 56.8 mL/min (A) (by C-G formula based on SCr of 1.42 mg/dL (H)). Recent Labs  Lab 05/28/23 1018 05/28/23 1134  WBC 10.4  --    LATICACIDVEN 1.5 1.5    Liver Function Tests: No results for input(s): "AST", "ALT", "ALKPHOS", "BILITOT", "PROT", "ALBUMIN" in the last 168 hours. No results for input(s): "LIPASE", "AMYLASE" in the last 168 hours. No results for input(s): "AMMONIA" in the last 168 hours.  ABG    Component Value Date/Time   PHART 7.426 12/03/2020 1753   PCO2ART 38.8 12/03/2020 1753   PO2ART 53 (L) 12/03/2020 1753   HCO3 25.7 12/03/2020 1753   TCO2 27 12/03/2020 1753   O2SAT 89.0 12/03/2020 1753     Coagulation Profile: No results for input(s): "INR", "PROTIME" in the last 168 hours.  Cardiac Enzymes: No results for input(s): "CKTOTAL", "CKMB", "CKMBINDEX", "TROPONINI" in the last 168 hours.  HbA1C: Hgb A1c MFr Bld  Date/Time Value Ref Range Status  09/12/2022 01:41 PM 6.0 (H) 4.8 - 5.6 % Final    Comment:             Prediabetes: 5.7 - 6.4          Diabetes: >6.4          Glycemic control for adults with diabetes: <7.0   12/04/2020 05:16 AM 5.7 (H) 4.8 - 5.6 % Final    Comment:    (NOTE) Pre diabetes:          5.7%-6.4%  Diabetes:              >6.4%  Glycemic control for   <7.0% adults with diabetes     CBG: No results for input(s): "GLUCAP" in the last 168 hours.  Review of Systems:   Review of Systems  Constitutional:  Negative for chills and fever.  Respiratory:  Positive for shortness of breath. Negative for cough, hemoptysis, sputum production and wheezing.   Cardiovascular:  Positive for chest pain. Negative for palpitations.     Past Medical History:  He,  has a past medical history of Hypertension and Stroke (HCC).   Surgical History:   Past Surgical History:  Procedure Laterality Date   CORONARY STENT INTERVENTION N/A 02/19/2021   Procedure: CORONARY STENT INTERVENTION;  Surgeon: Alwyn Pea, MD;  Location: [facility] INVASIVE CV LAB;  Service: Cardiovascular;  Laterality: N/A;   IR CT HEAD LTD  12/03/2020   IR INTRA CRAN STENT  12/03/2020   IR PERCUTANEOUS  ART THROMBECTOMY/INFUSION INTRACRANIAL INC DIAG ANGIO  12/03/2020   IR RADIOLOGIST EVAL & MGMT  02/23/2021   IR US GUIDE VASC ACCESS RIGHT  12/03/2020   LEFT HEART CATH AND CORONARY ANGIOGRAPHY N/A 02/19/2021   Procedure: LEFT HEART CATH AND CORONARY ANGIOGRAPHY with intervention;  Surgeon: Laurier Nancy, MD;  Location: [facility] INVASIVE CV LAB;  Service: Cardiovascular;  Laterality: N/A;   RADIOLOGY WITH ANESTHESIA N/A 12/03/2020   Procedure: IR WITH ANESTHESIA;  Surgeon: Radiologist, Medication, MD;  Location: [facility] OR;  Service: Radiology;  Laterality: N/A;     Social History:   reports that  he quit smoking about 28 years ago. His smoking use included cigarettes. He started smoking about 33 years ago. He has a 5 pack-year smoking history. He has never used smokeless tobacco. He reports that he does not currently use alcohol. He reports that he does not use drugs.   Family History:  His family history includes Hypertension in his brother.   Allergies No Known Allergies   Home Medications  Prior to Admission medications   Medication Sig Start Date End Date Taking? Authorizing Provider  amLODipine (NORVASC) 5 MG tablet Take 1 tablet (5 mg total) by mouth daily. 03/07/23 03/06/24  Laurier Nancy, MD  aspirin 81 MG chewable tablet Chew 1 tablet (81 mg total) by mouth daily. 12/07/20   Bailey-Modzik, Delila A, NP  atorvastatin (LIPITOR) 40 MG tablet TAKE 2 TABLETS(80 MG) BY MOUTH DAILY 05/09/23   Micki Riley, MD  cilostazol (PLETAL) 100 MG tablet Take 100 mg by mouth 2 (two) times daily.    [provider]  labetalol (NORMODYNE) 300 MG tablet TAKE 1 TABLET BY MOUTH TWICE DAILY 05/09/23   Scoggins, Hospital doctor, NP  losartan-hydrochlorothiazide (HYZAAR) 100-25 MG tablet Take 1 tablet by mouth daily. 12/06/22 12/06/23  Laurier Nancy, MD  sildenafil (VIAGRA) 50 MG tablet Take 0.5 tablets (25 mg total) by mouth as needed for erectile dysfunction. 03/07/23 03/06/24  Laurier Nancy, MD  UNABLE TO FIND  Take by mouth as needed. Med Name: OTC Acid Reflux medication    [provider]      I spent 67 minutes caring for this patient today, including preparing to see the patient, obtaining a medical history , reviewing a separately obtained history, performing a medically appropriate examination and/or evaluation, counseling and educating the patient/family/caregiver, ordering medications, tests, or procedures, documenting clinical information in the electronic health record, and independently interpreting results (not separately reported/billed) and communicating results to the patient/family/caregiver

## 2023-05-28 NOTE — H&P (View-Only) (Signed)
 [facility]ist of [facility]  Patient name: Travis Williams MRN: [id number] DOB: [birth year] Sex: male   REQUESTING PROVIDER:   ER   REASON FOR CONSULT:    PE  HISTORY OF PRESENT ILLNESS:   Travis Williams is a 68 y.o. male, who presented to the hospital with left-sided chest pain as well as shortness of breath with walking.  He denies any fevers or chills.  While in the ER, he was hypoxic and tachycardic.  CTA was performed that showed a saddle pulmonary embolism.  Currently, the patient is not complaining of shortness of breath while on nasal cannula oxygen.  He is not having any further pain.  He denies any risk factors for thrombotic event.  Specifically denies any prolonged inactivity.  He denies significant travel.  He does not have a history of PE or DVT or family history.  He has not had any recent illnesses or change in medication  Patient has a history of coronary artery disease, status post PCI.  He also has a history of stroke.  He is a former smoker.  He takes a statin for hypercholesterolemia.  PAST MEDICAL HISTORY    Past Medical History:  Diagnosis Date   Hypertension    Stroke [facility])      FAMILY HISTORY   Family History  Problem Relation Age of Onset   Hypertension Brother     SOCIAL HISTORY:   Social History   Socioeconomic History   Marital status: Single    Spouse name: Not on file   Number of children: 2   Years of education: Not on file   Highest education level: High school graduate  Occupational History   Not on file  Tobacco Use   Smoking status: Former    Current packs/day: 0.00    Average packs/day: 1 pack/day for 5.0 years (5.0 ttl pk-yrs)    Types: Cigarettes    Start date: 56    Quit date: 69    Years since quitting: 28.2   Smokeless tobacco: Never  Vaping Use   Vaping status: Never Used  Substance and Sexual Activity   Alcohol use: Not Currently   Drug use: Never   Sexual  activity: Not on file  Other Topics Concern   Not on file  Social History Narrative   Lives w roommate friend   Right handed   Caffeine: energy drink once a week   Social Drivers of Corporate investment banker Strain: Not on file  Food Insecurity: No Food Insecurity (01/29/2021)   Hunger Vital Sign    Worried About Running Out of Food in the Last Year: Never true    Ran Out of Food in the Last Year: Never true  Transportation Needs: No Transportation Needs (06/30/2021)   PRAPARE - Administrator, Civil Service (Medical): No    Lack of Transportation (Non-Medical): No  Physical Activity: Not on file  Stress: Not on file  Social Connections: Not on file  Intimate Partner Violence: Not on file    ALLERGIES:    No Known Allergies  CURRENT MEDICATIONS:    Current Facility-Administered Medications  Medication Dose Route Frequency Provider Last Rate Last Admin   aspirin chewable tablet 81 mg  81 mg Oral Daily Mikey College T, MD   81 mg at 05/28/23 1303   atorvastatin (LIPITOR) tablet 40 mg  40 mg Oral Daily Mikey College T, MD   40 mg at 05/28/23 1303   cilostazol (PLETAL) tablet  100 mg  100 mg Oral BID Mikey College T, MD       heparin ADULT infusion 100 units/mL (25000 units/272mL)  1,500 Units/hr Intravenous Continuous Littie Deeds, RPH 15 mL/hr at 05/28/23 1301 1,500 Units/hr at 05/28/23 1301   Current Outpatient Medications  Medication Sig Dispense Refill   amLODipine (NORVASC) 5 MG tablet Take 1 tablet (5 mg total) by mouth daily. 30 tablet 11   aspirin 81 MG chewable tablet Chew 1 tablet (81 mg total) by mouth daily.     atorvastatin (LIPITOR) 40 MG tablet TAKE 2 TABLETS(80 MG) BY MOUTH DAILY 90 tablet 1   cilostazol (PLETAL) 100 MG tablet Take 100 mg by mouth 2 (two) times daily.     labetalol (NORMODYNE) 300 MG tablet TAKE 1 TABLET BY MOUTH TWICE DAILY 180 tablet 1   losartan-hydrochlorothiazide (HYZAAR) 100-25 MG tablet Take 1 tablet by mouth daily. 30 tablet 11    sildenafil (VIAGRA) 50 MG tablet Take 0.5 tablets (25 mg total) by mouth as needed for erectile dysfunction. 5 tablet 0   UNABLE TO FIND Take by mouth as needed. Med Name: OTC Acid Reflux medication     Facility-Administered Medications Ordered in Other Encounters  Medication Dose Route Frequency Provider Last Rate Last Admin   sodium chloride flush (NS) 0.9 % injection 3 mL  3 mL Intravenous Q12H Laurier Nancy, MD        REVIEW OF SYSTEMS:   [X]  denotes positive finding, [ ]  denotes negative finding Cardiac  Comments:  Chest pain or chest pressure: x   Shortness of breath upon exertion:    Short of breath when lying flat:    Irregular heart rhythm:        Vascular    Pain in calf, thigh, or hip brought on by ambulation:    Pain in feet at night that wakes you up from your sleep:     Blood clot in your veins:    Leg swelling:         Pulmonary    Oxygen at home:    Productive cough:     Wheezing:         Neurologic    Sudden weakness in arms or legs:     Sudden numbness in arms or legs:     Sudden onset of difficulty speaking or slurred speech:    Temporary loss of vision in one eye:     Problems with dizziness:         Gastrointestinal    Blood in stool:      Vomited blood:         Genitourinary    Burning when urinating:     Blood in urine:        Psychiatric    Major depression:         Hematologic    Bleeding problems:    Problems with blood clotting too easily:        Skin    Rashes or ulcers:        Constitutional    Fever or chills:     PHYSICAL EXAM:   Vitals:   05/28/23 0930 05/28/23 1300 05/28/23 1328 05/28/23 1330  BP: (!) 141/88 116/71  125/80  Pulse: (!) 117 86  79  Resp: 18   (!) 22  Temp: 99 F (37.2 C)  98.5 F (36.9 C)   TempSrc:   Oral   SpO2: 93%     Weight:  Height:        GENERAL: The patient is a well-nourished male, in no acute distress. The vital signs are documented above. CARDIAC: There is a regular rate and  rhythm.  VASCULAR: Nonpalpable pedal pulses.  No significant edema PULMONARY: Nonlabored respirations ABDOMEN: Soft and non-tender with normal pitched bowel sounds.  MUSCULOSKELETAL: There are no major deformities or cyanosis. NEUROLOGIC: No focal weakness or paresthesias are detected. SKIN: There are no ulcers or rashes noted. PSYCHIATRIC: The patient has a normal affect.  STUDIES:   I have reviewed his CT scan with the following findings: Large clot burden pulmonary embolism with saddle embolus across the main pulmonary artery. Slight flattening of the intraventricular septum. Please correlate for clinical evidence of heart strain.   Parenchymal opacity seen along the lingula and left lower lobe. Infiltrate versus infarct is in the differential. Recommend follow-up.   Tiny left pleural effusion. ASSESSMENT and PLAN   PE: Etiology is unknown.  I would recommend bilateral lower extremity venous Doppler studies to rule out DVT.  He has already started on heparin.  Clinically, he appears very stable.  His heart rate is now less than 100 and he is breathing comfortably without any pleuritic chest pain.  That being said his CT scan shows massive clot burden on both main pulmonary arteries.  I suspect that with any activity, he will have significant challenges.  I talked to him about pulmonary thrombectomy to decrease his clot burden and to expedite his recovery.  I will make him n.p.o. after midnight and anticipating doing this procedure tomorrow.   Charlena Cross, MD, FACS [facility] [phone] Pager [phone]

## 2023-05-28 NOTE — ED Notes (Signed)
 This RN attempted for lab work twice and was unsuccessful. Phlebotomy called to draw labs.

## 2023-05-28 NOTE — ED Provider Notes (Signed)
 [facility] Provider Note    Event Date/Time   First MD Initiated Contact with Patient 05/28/23 0932     (approximate)   History   Flank Pain   HPI  Travis Williams is a 68 y.o. male   who presents to the ER for evaluation of 2 to 3 days of worsening left flank pain associate with shortness of breath.  Has had some chills no measured temperature.  Denies any cough.  Does have pain with deep inspiration.  Is not any blood thinners.  No lower extremity swelling.  No dysuria.  No abdominal pain.      Physical Exam   Triage Vital Signs: ED Triage Vitals  Encounter Vitals Group     BP 05/28/23 0930 (!) 141/88     Systolic BP Percentile --      Diastolic BP Percentile --      Pulse Rate 05/28/23 0930 (!) 117     Resp 05/28/23 0930 18     Temp 05/28/23 0930 99 F (37.2 C)     Temp src --      SpO2 05/28/23 0930 93 %     Weight 05/28/23 0929 240 lb (108.9 kg)     Height 05/28/23 0929 5\' 6"  (1.676 m)     Head Circumference --      Peak Flow --      Pain Score 05/28/23 0929 8     Pain Loc --      Pain Education --      Exclude from Growth Chart --     Most recent vital signs: Vitals:   05/28/23 0930  BP: (!) 141/88  Pulse: (!) 117  Resp: 18  Temp: 99 F (37.2 C)  SpO2: 93%     Constitutional: Alert  Eyes: Conjunctivae are normal.  Head: Atraumatic. Nose: No congestion/rhinnorhea. Mouth/Throat: Mucous membranes are moist.   Neck: Painless ROM.  Cardiovascular:   Good peripheral circulation. Respiratory: Normal respiratory effort.  No retractions.  Gastrointestinal: Soft and nontender.  Musculoskeletal:  no deformity Neurologic:  MAE spontaneously. No gross focal neurologic deficits are appreciated.  Skin:  Skin is warm, dry and intact. No rash noted. Psychiatric: Mood and affect are normal. Speech and behavior are normal.    ED Results / Procedures / Treatments   Labs (all labs ordered are listed, but only abnormal results are  displayed) Labs Reviewed  CBC - Abnormal; Notable for the following components:      Result Value   Hemoglobin 11.9 (*)    HCT 38.6 (*)    MCH 25.6 (*)    All other components within normal limits  BASIC METABOLIC PANEL - Abnormal; Notable for the following components:   Sodium 134 (*)    Glucose, Bld 160 (*)    Creatinine, Ser 1.42 (*)    Calcium 8.7 (*)    GFR, Estimated 53 (*)    All other components within normal limits  TROPONIN I (HIGH SENSITIVITY) - Abnormal; Notable for the following components:   Troponin I (High Sensitivity) 50 (*)    All other components within normal limits  RESP PANEL BY RT-PCR (RSV, FLU A&B, COVID)  [id number]  LACTIC ACID, PLASMA  LACTIC ACID, PLASMA  URINALYSIS, ROUTINE W REFLEX MICROSCOPIC  BRAIN NATRIURETIC PEPTIDE  HEPARIN LEVEL (UNFRACTIONATED)  PROTIME-INR  APTT  HIV ANTIBODY (ROUTINE TESTING W REFLEX)  CREATININE, URINE, RANDOM  SODIUM, URINE, RANDOM     EKG  ED ECG REPORT I, Luisa Hart  Roxan Hockey, the attending physician, personally viewed and interpreted this ECG.   Date: 05/28/2023  EKG Time: 12:52  Rate: 90  Rhythm: sinus  Axis: normal  Intervals:none  ST&T Change: nonspecific st abn    RADIOLOGY Please see ED Course for my review and interpretation.  I personally reviewed all radiographic images ordered to evaluate for the above acute complaints and reviewed radiology reports and findings.  These findings were personally discussed with the patient.  Please see medical record for radiology report.    PROCEDURES:  Critical Care performed: Yes, see critical care procedure note(s)  .Critical Care  Performed by: Willy Eddy, MD Authorized by: Willy Eddy, MD   Critical care provider statement:    Critical care time (minutes):  35   Critical care was necessary to treat or prevent imminent or life-threatening deterioration of the following conditions:  Respiratory failure   Critical care was time spent personally  by me on the following activities:  Ordering and performing treatments and interventions, ordering and review of laboratory studies, ordering and review of radiographic studies, pulse oximetry, re-evaluation of patient's condition, review of old charts, obtaining history from patient or surrogate, examination of patient, evaluation of patient's response to treatment, discussions with primary provider, discussions with consultants and development of treatment plan with patient or surrogate    MEDICATIONS ORDERED IN ED: Medications  heparin bolus via infusion 5,300 Units (has no administration in time range)  heparin ADULT infusion 100 units/mL (25000 units/237mL) (has no administration in time range)  aspirin chewable tablet 81 mg (has no administration in time range)  atorvastatin (LIPITOR) tablet 40 mg (has no administration in time range)  cilostazol (PLETAL) tablet 100 mg (has no administration in time range)  acetaminophen (TYLENOL) tablet 1,000 mg (1,000 mg Oral Given 05/28/23 1032)  iohexol (OMNIPAQUE) 350 MG/ML injection 75 mL (75 mLs Intravenous Contrast Given 05/28/23 1150)  sodium chloride 0.9 % bolus 500 mL (500 mLs Intravenous New Bag/Given 05/28/23 1242)     IMPRESSION / MDM / ASSESSMENT AND PLAN / ED COURSE  I reviewed the triage vital signs and the nursing notes.                              Differential diagnosis includes, but is not limited to, Asthma, copd, CHF, pna, ptx, malignancy, Pe, anemia  Patient presenting to the ER for evaluation of symptoms as described above.  Based on symptoms, risk factors and considered above differential, this presenting complaint could reflect a potentially life-threatening illness therefore the patient will be placed on continuous pulse oximetry and telemetry for monitoring.  Laboratory evaluation will be sent to evaluate for the above complaints.    Clinical Course as of 05/28/23 1256  Sun May 28, 2023  1010 Chest x-ray on my review and  interpretation without evidence of pneumothorax consolidation or mass.  Given his pleuritic pain tachycardia shortness of breath I do have a moderate to high suspicion for PE will order CTA to further evaluate. [PR]  1151 CTA my review and interpretation consistent with PE.  Will heparinize. [PR]  1202 Received a phone call from radiology regarding the patient's large saddle PE with large amount of clot burden.  No significant heart strain.  Recommending consultation with interventionalist.  Will consult vascular. [PR]  1213 Case discussed in consultation with vascular surgery.  The patient appears clinically well.  He is not an extremis.  Mild tachycardia.  He is on  2 L nasal cannula.  He is being heparinized.  Will consult hospitalist for admission. [PR]    Clinical Course User Index [PR] Willy Eddy, MD     FINAL CLINICAL IMPRESSION(S) / ED DIAGNOSES   Final diagnoses:  Acute saddle pulmonary embolism, unspecified whether acute cor pulmonale present [facility])     Rx / DC Orders   ED Discharge Orders     None        Note:  This document was prepared using Dragon voice recognition software and may include unintentional dictation errors.    Willy Eddy, MD 05/28/23 1256

## 2023-05-28 NOTE — Consult Note (Signed)
 [facility]ist of [facility]  Patient name: Travis Williams MRN: [id number] DOB: [birth year] Sex: male   REQUESTING PROVIDER:   ER   REASON FOR CONSULT:    PE  HISTORY OF PRESENT ILLNESS:   Travis Williams is a 68 y.o. male, who presented to the hospital with left-sided chest pain as well as shortness of breath with walking.  He denies any fevers or chills.  While in the ER, he was hypoxic and tachycardic.  CTA was performed that showed a saddle pulmonary embolism.  Currently, the patient is not complaining of shortness of breath while on nasal cannula oxygen.  He is not having any further pain.  He denies any risk factors for thrombotic event.  Specifically denies any prolonged inactivity.  He denies significant travel.  He does not have a history of PE or DVT or family history.  He has not had any recent illnesses or change in medication  Patient has a history of coronary artery disease, status post PCI.  He also has a history of stroke.  He is a former smoker.  He takes a statin for hypercholesterolemia.  PAST MEDICAL HISTORY    Past Medical History:  Diagnosis Date   Hypertension    Stroke [facility])      FAMILY HISTORY   Family History  Problem Relation Age of Onset   Hypertension Brother     SOCIAL HISTORY:   Social History   Socioeconomic History   Marital status: Single    Spouse name: Not on file   Number of children: 2   Years of education: Not on file   Highest education level: High school graduate  Occupational History   Not on file  Tobacco Use   Smoking status: Former    Current packs/day: 0.00    Average packs/day: 1 pack/day for 5.0 years (5.0 ttl pk-yrs)    Types: Cigarettes    Start date: 56    Quit date: 69    Years since quitting: 28.2   Smokeless tobacco: Never  Vaping Use   Vaping status: Never Used  Substance and Sexual Activity   Alcohol use: Not Currently   Drug use: Never   Sexual  activity: Not on file  Other Topics Concern   Not on file  Social History Narrative   Lives w roommate friend   Right handed   Caffeine: energy drink once a week   Social Drivers of Corporate investment banker Strain: Not on file  Food Insecurity: No Food Insecurity (01/29/2021)   Hunger Vital Sign    Worried About Running Out of Food in the Last Year: Never true    Ran Out of Food in the Last Year: Never true  Transportation Needs: No Transportation Needs (06/30/2021)   PRAPARE - Administrator, Civil Service (Medical): No    Lack of Transportation (Non-Medical): No  Physical Activity: Not on file  Stress: Not on file  Social Connections: Not on file  Intimate Partner Violence: Not on file    ALLERGIES:    No Known Allergies  CURRENT MEDICATIONS:    Current Facility-Administered Medications  Medication Dose Route Frequency Provider Last Rate Last Admin   aspirin chewable tablet 81 mg  81 mg Oral Daily Mikey College T, MD   81 mg at 05/28/23 1303   atorvastatin (LIPITOR) tablet 40 mg  40 mg Oral Daily Mikey College T, MD   40 mg at 05/28/23 1303   cilostazol (PLETAL) tablet  100 mg  100 mg Oral BID Mikey College T, MD       heparin ADULT infusion 100 units/mL (25000 units/272mL)  1,500 Units/hr Intravenous Continuous Littie Deeds, RPH 15 mL/hr at 05/28/23 1301 1,500 Units/hr at 05/28/23 1301   Current Outpatient Medications  Medication Sig Dispense Refill   amLODipine (NORVASC) 5 MG tablet Take 1 tablet (5 mg total) by mouth daily. 30 tablet 11   aspirin 81 MG chewable tablet Chew 1 tablet (81 mg total) by mouth daily.     atorvastatin (LIPITOR) 40 MG tablet TAKE 2 TABLETS(80 MG) BY MOUTH DAILY 90 tablet 1   cilostazol (PLETAL) 100 MG tablet Take 100 mg by mouth 2 (two) times daily.     labetalol (NORMODYNE) 300 MG tablet TAKE 1 TABLET BY MOUTH TWICE DAILY 180 tablet 1   losartan-hydrochlorothiazide (HYZAAR) 100-25 MG tablet Take 1 tablet by mouth daily. 30 tablet 11    sildenafil (VIAGRA) 50 MG tablet Take 0.5 tablets (25 mg total) by mouth as needed for erectile dysfunction. 5 tablet 0   UNABLE TO FIND Take by mouth as needed. Med Name: OTC Acid Reflux medication     Facility-Administered Medications Ordered in Other Encounters  Medication Dose Route Frequency Provider Last Rate Last Admin   sodium chloride flush (NS) 0.9 % injection 3 mL  3 mL Intravenous Q12H Laurier Nancy, MD        REVIEW OF SYSTEMS:   [X]  denotes positive finding, [ ]  denotes negative finding Cardiac  Comments:  Chest pain or chest pressure: x   Shortness of breath upon exertion:    Short of breath when lying flat:    Irregular heart rhythm:        Vascular    Pain in calf, thigh, or hip brought on by ambulation:    Pain in feet at night that wakes you up from your sleep:     Blood clot in your veins:    Leg swelling:         Pulmonary    Oxygen at home:    Productive cough:     Wheezing:         Neurologic    Sudden weakness in arms or legs:     Sudden numbness in arms or legs:     Sudden onset of difficulty speaking or slurred speech:    Temporary loss of vision in one eye:     Problems with dizziness:         Gastrointestinal    Blood in stool:      Vomited blood:         Genitourinary    Burning when urinating:     Blood in urine:        Psychiatric    Major depression:         Hematologic    Bleeding problems:    Problems with blood clotting too easily:        Skin    Rashes or ulcers:        Constitutional    Fever or chills:     PHYSICAL EXAM:   Vitals:   05/28/23 0930 05/28/23 1300 05/28/23 1328 05/28/23 1330  BP: (!) 141/88 116/71  125/80  Pulse: (!) 117 86  79  Resp: 18   (!) 22  Temp: 99 F (37.2 C)  98.5 F (36.9 C)   TempSrc:   Oral   SpO2: 93%     Weight:  Height:        GENERAL: The patient is a well-nourished male, in no acute distress. The vital signs are documented above. CARDIAC: There is a regular rate and  rhythm.  VASCULAR: Nonpalpable pedal pulses.  No significant edema PULMONARY: Nonlabored respirations ABDOMEN: Soft and non-tender with normal pitched bowel sounds.  MUSCULOSKELETAL: There are no major deformities or cyanosis. NEUROLOGIC: No focal weakness or paresthesias are detected. SKIN: There are no ulcers or rashes noted. PSYCHIATRIC: The patient has a normal affect.  STUDIES:   I have reviewed his CT scan with the following findings: Large clot burden pulmonary embolism with saddle embolus across the main pulmonary artery. Slight flattening of the intraventricular septum. Please correlate for clinical evidence of heart strain.   Parenchymal opacity seen along the lingula and left lower lobe. Infiltrate versus infarct is in the differential. Recommend follow-up.   Tiny left pleural effusion. ASSESSMENT and PLAN   PE: Etiology is unknown.  I would recommend bilateral lower extremity venous Doppler studies to rule out DVT.  He has already started on heparin.  Clinically, he appears very stable.  His heart rate is now less than 100 and he is breathing comfortably without any pleuritic chest pain.  That being said his CT scan shows massive clot burden on both main pulmonary arteries.  I suspect that with any activity, he will have significant challenges.  I talked to him about pulmonary thrombectomy to decrease his clot burden and to expedite his recovery.  I will make him n.p.o. after midnight and anticipating doing this procedure tomorrow.   Charlena Cross, MD, FACS [facility] of [facility] [phone] Pager [phone]

## 2023-05-29 ENCOUNTER — Encounter: Payer: Self-pay | Admitting: Vascular Surgery

## 2023-05-29 ENCOUNTER — Encounter
Admission: EM | Disposition: A | Discharge status: Home / Self Care | Payer: Self-pay | Source: Home / Self Care | Attending: Internal Medicine

## 2023-05-29 ENCOUNTER — Inpatient Hospital Stay
Admit: 2023-05-29 | Discharge: 2023-05-29 | Disposition: A | Discharge status: Home / Self Care | Attending: Vascular Surgery | Admitting: Vascular Surgery

## 2023-05-29 ENCOUNTER — Inpatient Hospital Stay: Admitting: Anesthesiology

## 2023-05-29 DIAGNOSIS — I2699 Other pulmonary embolism without acute cor pulmonale: Secondary | ICD-10-CM

## 2023-05-29 DIAGNOSIS — I10 Essential (primary) hypertension: Secondary | ICD-10-CM

## 2023-05-29 DIAGNOSIS — R7989 Other specified abnormal findings of blood chemistry: Secondary | ICD-10-CM | POA: Diagnosis not present

## 2023-05-29 DIAGNOSIS — I824Y3 Acute embolism and thrombosis of unspecified deep veins of proximal lower extremity, bilateral: Secondary | ICD-10-CM | POA: Diagnosis not present

## 2023-05-29 DIAGNOSIS — I2692 Saddle embolus of pulmonary artery without acute cor pulmonale: Secondary | ICD-10-CM | POA: Diagnosis not present

## 2023-05-29 HISTORY — PX: PULMONARY THROMBECTOMY: CATH118295

## 2023-05-29 LAB — CBC
HCT: 34.5 % — ABNORMAL LOW (ref 39.0–52.0)
Hemoglobin: 11 g/dL — ABNORMAL LOW (ref 13.0–17.0)
MCH: 25.8 pg — ABNORMAL LOW (ref 26.0–34.0)
MCHC: 31.9 g/dL (ref 30.0–36.0)
MCV: 80.8 fL (ref 80.0–100.0)
Platelets: 185 10*3/uL (ref 150–400)
RBC: 4.27 MIL/uL (ref 4.22–5.81)
RDW: 15.3 % (ref 11.5–15.5)
WBC: 9.9 10*3/uL (ref 4.0–10.5)
nRBC: 0 % (ref 0.0–0.2)

## 2023-05-29 LAB — URINALYSIS, ROUTINE W REFLEX MICROSCOPIC
Bilirubin Urine: NEGATIVE
Glucose, UA: NEGATIVE mg/dL
Hgb urine dipstick: NEGATIVE
Ketones, ur: NEGATIVE mg/dL
Leukocytes,Ua: NEGATIVE
Nitrite: NEGATIVE
Protein, ur: NEGATIVE mg/dL
Specific Gravity, Urine: 1.017 (ref 1.005–1.030)
pH: 5 (ref 5.0–8.0)

## 2023-05-29 LAB — SODIUM, URINE, RANDOM: Sodium, Ur: 90 mmol/L

## 2023-05-29 LAB — HEPARIN LEVEL (UNFRACTIONATED): Heparin Unfractionated: 0.46 [IU]/mL (ref 0.30–0.70)

## 2023-05-29 LAB — BASIC METABOLIC PANEL
Anion gap: 9 (ref 5–15)
BUN: 22 mg/dL (ref 8–23)
CO2: 24 mmol/L (ref 22–32)
Calcium: 8.5 mg/dL — ABNORMAL LOW (ref 8.9–10.3)
Chloride: 101 mmol/L (ref 98–111)
Creatinine, Ser: 1.3 mg/dL — ABNORMAL HIGH (ref 0.61–1.24)
GFR, Estimated: 59 mL/min — ABNORMAL LOW (ref >60)
Glucose, Bld: 122 mg/dL — ABNORMAL HIGH (ref 70–99)
Potassium: 3.4 mmol/L — ABNORMAL LOW (ref 3.5–5.1)
Sodium: 134 mmol/L — ABNORMAL LOW (ref 135–145)

## 2023-05-29 LAB — CREATININE, URINE, RANDOM: Creatinine, Urine: 125 mg/dL

## 2023-05-29 SURGERY — PULMONARY THROMBECTOMY
Anesthesia: Moderate Sedation | Laterality: Bilateral

## 2023-05-29 MED ORDER — HEPARIN SODIUM (PORCINE) 1000 UNIT/ML IJ SOLN
INTRAMUSCULAR | Status: DC | PRN
  Administered 2023-05-29: 3000 [IU] via INTRAVENOUS

## 2023-05-29 MED ORDER — FENTANYL CITRATE PF 50 MCG/ML IJ SOSY
PREFILLED_SYRINGE | INTRAMUSCULAR | Status: AC
Start: 2023-05-29 — End: ?
  Filled 2023-05-29: qty 2

## 2023-05-29 MED ORDER — IODIXANOL 320 MG/ML IV SOLN
INTRAVENOUS | Status: DC | PRN
  Administered 2023-05-29: 50 mL

## 2023-05-29 MED ORDER — LOSARTAN POTASSIUM-HCTZ 100-25 MG PO TABS: 1.0000 | ORAL_TABLET | Freq: Every day | ORAL | Status: DC

## 2023-05-29 MED ORDER — MIDAZOLAM HCL 2 MG/2ML IJ SOLN
INTRAMUSCULAR | Status: DC | PRN
Start: 2023-05-29 — End: 2023-05-29
  Administered 2023-05-29: 1 mg via INTRAVENOUS

## 2023-05-29 MED ORDER — LOSARTAN POTASSIUM 50 MG PO TABS
100.0000 mg | ORAL_TABLET | Freq: Every day | ORAL | Status: DC
  Administered 2023-05-29 – 2023-05-30 (×2): 100 mg via ORAL
  Filled 2023-05-29 (×2): qty 2

## 2023-05-29 MED ORDER — SODIUM CHLORIDE 0.9 % IV SOLN
INTRAVENOUS | Status: DC | PRN
  Administered 2023-05-29: 75 mL/h via INTRAVENOUS

## 2023-05-29 MED ORDER — POTASSIUM CHLORIDE CRYS ER 20 MEQ PO TBCR
20.0000 meq | EXTENDED_RELEASE_TABLET | Freq: Once | ORAL | Status: AC
  Administered 2023-05-29: 20 meq via ORAL
  Filled 2023-05-29: qty 1

## 2023-05-29 MED ORDER — AMLODIPINE BESYLATE 5 MG PO TABS
5.0000 mg | ORAL_TABLET | Freq: Every day | ORAL | Status: DC
  Administered 2023-05-29 – 2023-05-30 (×2): 5 mg via ORAL
  Filled 2023-05-29 (×2): qty 1

## 2023-05-29 MED ORDER — CEFAZOLIN SODIUM-DEXTROSE 1-4 GM/50ML-% IV SOLN
INTRAVENOUS | Status: DC | PRN
  Administered 2023-05-29: 2 g via INTRAVENOUS

## 2023-05-29 MED ORDER — LABETALOL HCL 200 MG PO TABS
300.0000 mg | ORAL_TABLET | Freq: Two times a day (BID) | ORAL | Status: DC
  Administered 2023-05-29 – 2023-05-30 (×3): 300 mg via ORAL
  Filled 2023-05-29 (×3): qty 2

## 2023-05-29 MED ORDER — PNEUMOCOCCAL 20-VAL CONJ VACC 0.5 ML IM SUSY: 0.5000 mL | PREFILLED_SYRINGE | INTRAMUSCULAR | Status: DC

## 2023-05-29 MED ORDER — LIDOCAINE-EPINEPHRINE (PF) 1 %-1:200000 IJ SOLN
INTRAMUSCULAR | Status: DC | PRN
  Administered 2023-05-29: 10 mL

## 2023-05-29 MED ORDER — HYDROCHLOROTHIAZIDE 25 MG PO TABS
25.0000 mg | ORAL_TABLET | Freq: Every day | ORAL | Status: DC
  Administered 2023-05-29 – 2023-05-30 (×2): 25 mg via ORAL
  Filled 2023-05-29 (×2): qty 1

## 2023-05-29 MED ORDER — MIDAZOLAM HCL 5 MG/5ML IJ SOLN
INTRAMUSCULAR | Status: AC
  Filled 2023-05-29: qty 5

## 2023-05-29 MED ORDER — FENTANYL CITRATE (PF) 100 MCG/2ML IJ SOLN
INTRAMUSCULAR | Status: DC | PRN
  Administered 2023-05-29: 50 ug via INTRAVENOUS

## 2023-05-29 MED ORDER — CEFAZOLIN SODIUM-DEXTROSE 2-4 GM/100ML-% IV SOLN
INTRAVENOUS | Status: AC
  Filled 2023-05-29: qty 100

## 2023-05-29 MED ORDER — HEPARIN (PORCINE) IN NACL 1000-0.9 UT/500ML-% IV SOLN
INTRAVENOUS | Status: DC | PRN
  Administered 2023-05-29: 1000 mL

## 2023-05-29 MED ORDER — HEPARIN SODIUM (PORCINE) 1000 UNIT/ML IJ SOLN
INTRAMUSCULAR | Status: AC
  Filled 2023-05-29: qty 10

## 2023-05-29 SURGICAL SUPPLY — 16 items
CANISTER PENUMBRA ENGINE (MISCELLANEOUS) IMPLANT
CATH ANGIO 5F PIGTAIL 100CM (CATHETERS) IMPLANT
CATH INDIGO 12XTORQ 100 (CATHETERS) IMPLANT
CATH INDIGO SEP 12 (CATHETERS) IMPLANT
CATH SELECT BERN TIP 5F 130 (CATHETERS) IMPLANT
CLOSURE PERCLOSE PROSTYLE (VASCULAR PRODUCTS) IMPLANT
COVER PROBE ULTRASOUND 5X96 (MISCELLANEOUS) IMPLANT
GLIDEWIRE ADV .035X180CM (WIRE) IMPLANT
INTRODUCER PERFORM 12FR X13 (SHEATH) IMPLANT
PACK ANGIOGRAPHY (CUSTOM PROCEDURE TRAY) ×1 IMPLANT
SHEATH BRITE TIP 6FRX11 (SHEATH) IMPLANT
SUT MNCRL AB 4-0 PS2 18 (SUTURE) IMPLANT
SYR MEDRAD MARK 7 150ML (SYRINGE) IMPLANT
TUBING CONTRAST HIGH PRESS 72 (TUBING) IMPLANT
WIRE GUIDERIGHT .035X150 (WIRE) IMPLANT
WIRE SUPRACORE 300CM (WIRE) IMPLANT

## 2023-05-29 NOTE — Progress Notes (Signed)
 [facility]  - [place] at [facility]   PATIENT NAME: Travis Williams    MR#:  [id number]  DATE OF BIRTH:  06-12-1955  SUBJECTIVE:  patient seen earlier. No family at bedside. Came in with increasing shortness of breath and chest tightness. Was found to have saddle PE and bilateral lower extremity DVT currently on IV heparin drip patient denies any chest pain. Denies any recent travel illness or any surgery requiring prolonged bed rest    VITALS:  Blood pressure 116/72, pulse 98, temperature 99.2 F (37.3 C), temperature source Oral, resp. rate (!) 26, height 5\' 6"  (1.676 m), weight 108.9 kg, SpO2 91%.  PHYSICAL EXAMINATION:   GENERAL:  68 y.o.-year-old patient with no acute distress.  LUNGS: Normal breath sounds bilaterally, no wheezing CARDIOVASCULAR: S1, S2 normal. No murmur   ABDOMEN: Soft, nontender, nondistended. Bowel sounds present.  EXTREMITIES: No  edema b/l.    NEUROLOGIC: nonfocal  patient is alert and awake SKIN: No obvious rash, lesion, or ulcer.   LABORATORY PANEL:  CBC Recent Labs  Lab 05/29/23 0012  WBC 9.9  HGB 11.0*  HCT 34.5*  PLT 185    Chemistries  Recent Labs  Lab 05/29/23 0012  NA 134*  K 3.4*  CL 101  CO2 24  GLUCOSE 122*  BUN 22  CREATININE 1.30*  CALCIUM 8.5*    RADIOLOGY:  PERIPHERAL VASCULAR CATHETERIZATION Result Date: 05/29/2023 See surgical note for result.  US Venous Img Lower Bilateral (DVT) Result Date: 05/28/2023 CLINICAL DATA:  Pulmonary emboli. EXAM: BILATERAL LOWER EXTREMITY VENOUS DOPPLER ULTRASOUND TECHNIQUE: Gray-scale sonography with graded compression, as well as color Doppler and duplex ultrasound were performed to evaluate the lower extremity deep venous systems from the level of the common femoral vein and including the common femoral, femoral, profunda femoral, popliteal and calf veins including the posterior tibial, peroneal and gastrocnemius veins when visible. The superficial great saphenous vein  was also interrogated. Spectral Doppler was utilized to evaluate flow at rest and with distal augmentation maneuvers in the common femoral, femoral and popliteal veins. COMPARISON:  None Available. FINDINGS: RIGHT LOWER EXTREMITY Common Femoral Vein: No evidence of thrombus. Normal compressibility. Saphenofemoral Junction: No evidence of thrombus. Normal compressibility and flow on color Doppler imaging. Profunda Femoral Vein: No evidence of thrombus. Normal compressibility and flow on color Doppler imaging. Femoral Vein: Nonocclusive thrombus. Popliteal Vein: Nonocclusive thrombus. Calf Veins: No evidence of thrombus. Normal compressibility and flow on color Doppler imaging. Other Findings:  None. LEFT LOWER EXTREMITY Common Femoral Vein: No evidence of thrombus. Normal compressibility, respiratory phasicity and response to augmentation. Saphenofemoral Junction: No evidence of thrombus. Normal compressibility and flow on color Doppler imaging. Profunda Femoral Vein: No evidence of thrombus. Normal compressibility and flow on color Doppler imaging. Femoral Vein: No evidence of thrombus. Normal compressibility, respiratory phasicity and response to augmentation. Popliteal Vein: No evidence of thrombus. Normal compressibility, respiratory phasicity and response to augmentation. Calf Veins: Occlusive thrombus in the posterior tibial vein. Other Findings:  None. IMPRESSION: 1. Nonocclusive thrombus in the right femoral and popliteal veins. 2. Occlusive thrombus in the left posterior tibial vein. Electronically Signed   By: Narda Rutherford M.D.   On: 05/28/2023 15:56   CT Angio Chest PE W and/or Wo Contrast Result Date: 05/28/2023 CLINICAL DATA:  Pain EXAM: CT ANGIOGRAPHY CHEST WITH CONTRAST TECHNIQUE: Multidetector CT imaging of the chest was performed using the standard protocol during bolus administration of intravenous contrast. Multiplanar CT image reconstructions and MIPs were obtained to  evaluate the vascular  anatomy. RADIATION DOSE REDUCTION: This exam was performed according to the departmental dose-optimization program which includes automated exposure control, adjustment of the mA and/or kV according to patient size and/or use of iterative reconstruction technique. CONTRAST:  75mL OMNIPAQUE IOHEXOL 350 MG/ML SOLN COMPARISON:  Chest x-ray 05/28/2023 FINDINGS: Cardiovascular: Thoracic aorta has a normal course and caliber with partially calcified atherosclerotic plaque. Is also some plaque extending along the great vessels particularly the left subclavian artery. Coronary artery calcifications are seen. The heart is nonenlarged. Question of some left ventricular wall hypertrophy. No pericardial effusion. Bilateral pulmonary emboli are identified including a saddle embolus across the bifurcation of the main pulmonary artery. Significant areas of central bilateral lung emboli along the pulmonary arteries. Relatively large clot burden identified. There is slight flattening of the intraventricular septum. Please correlate for other clinical evidence of right heart strain. Mediastinum/Nodes: Large hiatal hernia. Patulous esophagus. Preserved thyroid gland. No specific abnormal lymph node enlargement identified in the axillary regions, hilum or mediastinum. Lungs/Pleura: Breathing motion identified. Parenchymal opacity seen towards inferior aspect of lingula and peripheral left lower lobe. There is also some hazy ground-glass in the left upper lobe. Tiny left pleural effusion. Upper Abdomen: Adrenal glands are preserved in the upper abdomen. Stones in the nondilated gallbladder at the edge of the imaging field. Musculoskeletal: Scattered degenerative changes of the spine. Bridging osteophytes. Schmorl's node changes. Gynecomastia. Critical Value/emergent results were called by telephone at the time of interpretation on 05/28/2023 at 11:58 am to provider Willy Eddy , who verbally acknowledged these results. Review of the  MIP images confirms the above findings. IMPRESSION: Large clot burden pulmonary embolism with saddle embolus across the main pulmonary artery. Slight flattening of the intraventricular septum. Please correlate for clinical evidence of heart strain. Parenchymal opacity seen along the lingula and left lower lobe. Infiltrate versus infarct is in the differential. Recommend follow-up. Tiny left pleural effusion. Large hiatal hernia. Gallstones. Gynecomastia. Aortic Atherosclerosis (ICD10-I70.0). Electronically Signed   By: Karen Kays M.D.   On: 05/28/2023 12:03   DG Chest Portable 1 View Result Date: 05/28/2023 CLINICAL DATA:  Flank pain for 2 days. EXAM: PORTABLE CHEST 1 VIEW COMPARISON:  X-ray 12/03/2020. FINDINGS: Underinflation. Borderline cardiopericardial silhouette with some prominent central vasculature. Subtle opacity seen at both lung bases. Infiltrate is possible. Possible tiny left effusion as well. No pneumothorax. Film is under penetrated. Degenerative changes of the spine. IMPRESSION: Underinflation with slight lung base opacities. Borderline size heart.  Prominent central vasculature. Recommend follow-up Electronically Signed   By: Karen Kays M.D.   On: 05/28/2023 10:02    Assessment and Plan  CT Angela chest Large clot burden pulmonary embolism with saddle embolus across the main pulmonary artery. Slight flattening of the intraventricular septum. Please correlate for clinical evidence of heart strain.  Parenchymal opacity seen along the lingula and left lower lobe. Infiltrate versus infarct is in the differential.   Tiny left pleural effusion.  Bilateral lower extremity ultrasound  1. Nonocclusive thrombus in the right femoral and popliteal veins. 2. Occlusive thrombus in the left posterior tibial vein.   Acute Saddle PE, unprovoked Bilateral LE DVT -D/W ICU attending, given there is no symptoms or signs of hemodynamic instability, no recommendation for TNK. -Continue heparin  drip -- echo pending -- patient was seen cardiovascular surgery is status post mechanical thrombectomy for PE -- consider changing to oral anticoagulation tomorrow   AKI vs CKD-2 -No urinary symptoms and he is euvolemic -- monitor metabolic panel  HTN -will resume home meds now that vascular procedures completed   CAD -Low suspicion for ACS.   PVD -No acute concern -- currently holding cilostazol and aspirin since patient is on heparin   DVT prophylaxis: Heparin drip Code Status: Full code Family Communication: None at bedside Procedures: mechanical thrombectomy for saddle PE Consults : vascular surgery, ICU/pulmonary  Level of care: Stepdown Status is: Inpatient Remains inpatient appropriate because: saddle PE status post procedure, bilateral DVT    TOTAL critical TIME TAKING CARE OF THIS PATIENT: 50 minutes.  >50% time spent on counselling and coordination of care  Note: This dictation was prepared with Dragon dictation along with smaller phrase technology. Any transcriptional errors that result from this process are unintentional.  Enedina Finner M.D    [facility]   CC: Primary care physician; Laurier Nancy, MD

## 2023-05-29 NOTE — Progress Notes (Signed)
*  PRELIMINARY RESULTS* Echocardiogram 2D Echocardiogram has been performed.  Travis Williams 05/29/2023, 12:51 PM

## 2023-05-29 NOTE — Plan of Care (Addendum)
 The patient remains on AR-ICU/SDU at time of writing. The patient remains on 2 L / min of supplemental O2 via [place]. The patient continues to receive a continuous infusion of IV heparin. The patient denies shortness of breath or chest pain. Patient's admission profile completed by this RN overnight. Patient declines CHG bath overnight.    Problem: Education: Goal: Knowledge of General Education information will improve Description: Including pain rating scale, medication(s)/side effects and non-pharmacologic comfort measures Outcome: Progressing   Problem: Health Behavior/Discharge Planning: Goal: Ability to manage health-related needs will improve Outcome: Progressing   Problem: Clinical Measurements: Goal: Ability to maintain clinical measurements within normal limits will improve Outcome: Progressing Goal: Will remain free from infection Outcome: Progressing Goal: Diagnostic test results will improve Outcome: Progressing Goal: Respiratory complications will improve Outcome: Progressing Goal: Cardiovascular complication will be avoided Outcome: Progressing   Problem: Activity: Goal: Risk for activity intolerance will decrease Outcome: Progressing   Problem: Nutrition: Goal: Adequate nutrition will be maintained Outcome: Progressing   Problem: Coping: Goal: Level of anxiety will decrease Outcome: Progressing   Problem: Elimination: Goal: Will not experience complications related to bowel motility Outcome: Progressing Goal: Will not experience complications related to urinary retention Outcome: Progressing   Problem: Pain Managment: Goal: General experience of comfort will improve and/or be controlled Outcome: Progressing   Problem: Safety: Goal: Ability to remain free from injury will improve Outcome: Progressing   Problem: Skin Integrity: Goal: Risk for impaired skin integrity will decrease Outcome: Progressing

## 2023-05-29 NOTE — Progress Notes (Signed)
 Bedside report/handoff given to [employer]. Patient stable.

## 2023-05-29 NOTE — Plan of Care (Signed)
   Problem: Clinical Measurements: Goal: Ability to maintain clinical measurements within normal limits will improve Outcome: Progressing

## 2023-05-29 NOTE — Plan of Care (Signed)
  Problem: Education: Goal: Knowledge of General Education information will improve Description: Including pain rating scale, medication(s)/side effects and non-pharmacologic comfort measures Outcome: Progressing   Problem: Clinical Measurements: Goal: Ability to maintain clinical measurements within normal limits will improve Outcome: Progressing Goal: Will remain free from infection Outcome: Progressing Goal: Cardiovascular complication will be avoided Outcome: Progressing   Problem: Activity: Goal: Risk for activity intolerance will decrease Outcome: Progressing   Problem: Nutrition: Goal: Adequate nutrition will be maintained Outcome: Progressing   Problem: Coping: Goal: Level of anxiety will decrease Outcome: Progressing   Problem: Elimination: Goal: Will not experience complications related to urinary retention Outcome: Progressing   Problem: Pain Managment: Goal: General experience of comfort will improve and/or be controlled Outcome: Progressing   Problem: Safety: Goal: Ability to remain free from injury will improve Outcome: Progressing

## 2023-05-29 NOTE — Progress Notes (Addendum)
 Made Dr. Wyn Quaker aware unable to now get a pulse with doppler in right pedal. Foot is cooler to touch. Was able to get right posterior tibial pulse with doppler. Shirlyn Goltz NP was added to chat by Dr. Wyn Quaker. Right groin site level 0 with no pain. No new orders per md

## 2023-05-29 NOTE — Progress Notes (Signed)
 Dr. Allena Katz made aware heart remaining elevated. Md reviewed home medications, new orders placed. Md aware potassium is 3.4 being replaced by pharmacist

## 2023-05-29 NOTE — Op Note (Signed)
 [facility]  Percutaneous Study/Intervention Procedural Note   Date of Surgery: 05/29/2023,9:59 AM  Surgeon: Festus Barren  Pre-operative Diagnosis: Symptomatic bilateral pulmonary emboli  Post-operative diagnosis:  Same  Procedure(s) Performed:  1.  Contrast injection right heart  2.  Mechanical thrombectomy using the penumbra CAT 12 catheter to the right lower lobe, middle lobe, and upper lobe pulmonary arteries as well as mechanical thrombectomy using the penumbra CAT 12 catheter to the left lower lobe and left upper lobe pulmonary arteries  3.  Selective catheter placement left upper lobe and lower lobe pulmonary arteries  4.  Selective catheter placement right lower lobe, middle lobe, and upper lobe pulmonary arteries      Anesthesia: Conscious sedation was administered under my direct supervision by the interventional radiology RN. IV Versed plus fentanyl were utilized. Continuous ECG, pulse oximetry and blood pressure was monitored throughout the entire procedure.  Versed and fentanyl were administered intravenously.  Conscious sedation was administered for a total of 22 minutes using 1 mg of Versed and 50 mcg of Fentanyl.  EBL: 350 cc  Sheath: 12 French right femoral vein  Contrast: 50 cc   Fluoroscopy Time: 7.8 minutes  Indications:  Patient presents with pulmonary emboli. The patient is symptomatic with hypoxemia and dyspnea on exertion.  There is evidence of right heart strain and the patient has elevated troponin, tachypnea, tachycardia, and hypoxia requiring supplemental oxygen. The patient is otherwise a good candidate for intervention and even the long-term benefits pulmonary angiography with thrombolysis is offered. The risks and benefits are reviewed long-term benefits are discussed. All questions are answered patient agrees to proceed.  Procedure:  Travis Williams a 68 y.o. male who was identified and appropriate procedural time out was performed.   The patient was then placed supine on the table and prepped and draped in the usual sterile fashion.  Ultrasound was used to evaluate the right common femoral vein.  It was patent, as it was echolucent and compressible.  A digital ultrasound image was acquired for the permanent record.  A Seldinger needle was used to access the right common femoral vein under direct ultrasound guidance.  A 0.035 J wire was advanced without resistance and a 6Fr sheath was placed. A proglide device was placed in a preclose fashion and then upsized to a 12 [place] sheath.    The Advantage wire and pigtail catheter were then negotiated into the right atrium and bolus injection of contrast was utilized to demonstrate the right ventricle and the pulmonary artery outflow. The Advantage wire and catheter were then negotiated into the the pulmonary arteries.  The left pulmonary artery was cannulated and advanced into the left upper lobe and left lower lobe for selective imaging. This demonstrated significant clot burden throughout the left main, lower lobe, and upper lobe pulmonary arteries.  I then transitioned to the right side with the advantage wire and catheter and first cannulated the right lower lobe and then the right middle and upper lobes.  This demonstrated significant clot burden in the distal right main, upper lobe, middle lobe, and lower lobe pulmonary arteries.  3000 units of heparin was then given and allowed to circulate.  The Penumbra Cat 12 catheter was then advanced up into the pulmonary vasculature. The right lung was addressed first. Catheter was negotiated into the right lower lobe pulmonary artery and mechanical thrombectomy was performed with the help of the separator. Follow-up imaging demonstrated a good result and therefore the catheter was renegotiated into  the right middle lobe pulmonary artery and again mechanical thrombectomy was performed. Passes were made with both the Penumbra catheter itself as well  as introducing the separator.  Finally, mechanical thrombectomy using the penumbra CAT 12 catheter and the separator were done in the right upper lobe pulmonary artery.  Follow-up imaging was then performed.  Significant improvement was seen although image quality was poor secondary to respiratory artifact and some patient motion.  The Penumbra Cat 12 catheter was then negotiated to the opposite side. The left lung was then addressed. Catheter was negotiated into the left upper lobe pulmonary artery and mechanical thrombectomy was performed with the separator. Follow-up imaging demonstrated a good result and therefore the catheter was renegotiated into the left lower lobe pulmonary artery and again mechanical thrombectomy was performed. Passes were made with both the Penumbra catheter itself as well as introducing the separator. Follow-up imaging was then performed.  After review these images wires were reintroduced and the catheter is removed. Then, the sheath is then pulled, the proglide device is secured, a Monocryl skin suture was placed and pressure is held. A sterile dressing is placed.    Findings:   Right heart imaging:  Right atrium and right ventricle and the pulmonary outflow tract appears markedly dilated  Right lung:  This demonstrated significant clot burden in the distal right main, upper lobe, middle lobe, and lower lobe pulmonary arteries.  Left lung:  This demonstrated significant clot burden throughout the left main, lower lobe, and upper lobe pulmonary arteries.    Disposition: Patient was taken to the recovery room in stable condition having tolerated the procedure well.  Travis Williams 05/29/2023,9:59 AM

## 2023-05-29 NOTE — Progress Notes (Signed)
 Patient returned to bed for reposition. Rechecked pedal pulse, was able to doppler a pulse in right foot. Foot remains cools to touch however, not as cool as prior

## 2023-05-29 NOTE — Progress Notes (Signed)
 Patient taken off the floor via bed. On 02 2l. No distress noted

## 2023-05-29 NOTE — Progress Notes (Signed)
 Per verbal order from Dr. Wyn Quaker, resume heparin drip now, at previous rate.

## 2023-05-29 NOTE — Interval H&P Note (Signed)
 History and Physical Interval Note:  05/29/2023 9:01 AM  Travis Williams  has presented today for surgery, with the diagnosis of PE.  The various methods of treatment have been discussed with the patient and family. After consideration of risks, benefits and other options for treatment, the patient has consented to  Procedure(s): PULMONARY THROMBECTOMY (Bilateral) as a surgical intervention.  The patient's history has been reviewed, patient examined, no change in status, stable for surgery.  I have reviewed the patient's chart and labs.  Questions were answered to the patient's satisfaction.     Festus Barren

## 2023-05-29 NOTE — Consult Note (Signed)
 PHARMACY - ANTICOAGULATION CONSULT NOTE  Pharmacy Consult for Heparin  Indication: pulmonary embolus  No Known Allergies  Patient Measurements: Height: 5\' 6"  (167.6 cm) Weight: 108.9 kg (240 lb 1.3 oz) IBW/kg (Calculated) : 63.8 Heparin Dosing Weight: 88.5 kg   Vital Signs: Temp: 99.2 F (37.3 C) (03/10 0000) Temp Source: Oral (03/10 0000) BP: 137/80 (03/10 0000) Pulse Rate: 91 (03/10 0000)  Labs: Recent Labs    05/28/23 1018 05/28/23 1240 05/28/23 1604 05/28/23 1819 05/29/23 0012  HGB 11.9*  --   --   --  11.0*  HCT 38.6*  --   --   --  34.5*  PLT 188  --   --   --  185  APTT  --  29  --   --   --   LABPROT  --  14.6  --   --   --   INR  --  1.1  --   --   --   HEPARINUNFRC  --   --   --  0.59 0.46  CREATININE 1.42*  --   --   --  1.30*  TROPONINIHS 50*  --  47*  --   --    Estimated Creatinine Clearance: 62 mL/min (A) (by C-G formula based on SCr of 1.3 mg/dL (H)).  Medical History: Past Medical History:  Diagnosis Date   Hypertension    Stroke [facility])     Assessment: Travis Williams is a 68 y.o. male presenting with 2-3 days of worsening flank pain with associated SOB. PMH significant for ischemic stroke, UA, PVD, CAD, HTN, OSA. CT angio chest shows a large clot burden pulmonary embolism with saddle embolus across the main pulmonary artery. Patient was not on [facility] PTA per chart review. Pharmacy has been consulted to initiate and manage heparin infusion.   Baseline Labs: aPTT 29, PT 14.6, INR 1.1, Hgb 11.9, Hct 38.6, Plt 188   Goal of Therapy:  Heparin level 0.3-0.7 units/ml Monitor platelets by anticoagulation protocol: Yes   Date Time HL Rate/Comment  3/9 1819 0.59 1500/therapeutic x1 3/10 0012 0.46 1500/therapeutic x 2  Plan:  Continue heparin infusion at 1500 units/hr Recheck HL daily w/ AM labs while therapeutic Continue to monitor H&H and platelets daily while on heparin infusion   Otelia Sergeant, PharmD, [facility] 05/29/2023 1:02 AM

## 2023-05-30 ENCOUNTER — Telehealth (HOSPITAL_COMMUNITY): Payer: Self-pay | Admitting: Pharmacy Technician

## 2023-05-30 ENCOUNTER — Other Ambulatory Visit (HOSPITAL_COMMUNITY): Payer: Self-pay

## 2023-05-30 DIAGNOSIS — J9601 Acute respiratory failure with hypoxia: Secondary | ICD-10-CM | POA: Insufficient documentation

## 2023-05-30 DIAGNOSIS — I2692 Saddle embolus of pulmonary artery without acute cor pulmonale: Secondary | ICD-10-CM | POA: Diagnosis not present

## 2023-05-30 LAB — ECHOCARDIOGRAM COMPLETE
AR max vel: 2.6 cm2
AV Area VTI: 3.04 cm2
AV Area mean vel: 2.59 cm2
AV Mean grad: 5 mmHg
AV Peak grad: 9.1 mmHg
Ao pk vel: 1.51 m/s
Area-P 1/2: 7.09 cm2
Height: 66 in
S' Lateral: 2.1 cm
Weight: 3841.3 [oz_av]

## 2023-05-30 LAB — CBC
HCT: 33 % — ABNORMAL LOW (ref 39.0–52.0)
Hemoglobin: 10.6 g/dL — ABNORMAL LOW (ref 13.0–17.0)
MCH: 25.9 pg — ABNORMAL LOW (ref 26.0–34.0)
MCHC: 32.1 g/dL (ref 30.0–36.0)
MCV: 80.5 fL (ref 80.0–100.0)
Platelets: 173 10*3/uL (ref 150–400)
RBC: 4.1 MIL/uL — ABNORMAL LOW (ref 4.22–5.81)
RDW: 15.1 % (ref 11.5–15.5)
WBC: 10.3 10*3/uL (ref 4.0–10.5)
nRBC: 0 % (ref 0.0–0.2)

## 2023-05-30 LAB — HEPARIN LEVEL (UNFRACTIONATED): Heparin Unfractionated: 0.28 [IU]/mL — ABNORMAL LOW (ref 0.30–0.70)

## 2023-05-30 LAB — GLUCOSE, CAPILLARY: Glucose-Capillary: 98 mg/dL (ref 70–99)

## 2023-05-30 MED ORDER — APIXABAN 5 MG PO TABS: ORAL_TABLET | ORAL | 1 refills | Status: DC

## 2023-05-30 MED ORDER — APIXABAN 5 MG PO TABS
10.0000 mg | ORAL_TABLET | Freq: Two times a day (BID) | ORAL | Status: DC
  Administered 2023-05-30: 10 mg via ORAL
  Filled 2023-05-30: qty 2

## 2023-05-30 MED ORDER — APIXABAN 5 MG PO TABS: 5.0000 mg | ORAL_TABLET | Freq: Two times a day (BID) | ORAL | Status: DC

## 2023-05-30 MED ORDER — HEPARIN BOLUS VIA INFUSION
1300.0000 [IU] | Freq: Once | INTRAVENOUS | Status: AC
  Administered 2023-05-30: 1300 [IU] via INTRAVENOUS
  Filled 2023-05-30: qty 1300

## 2023-05-30 NOTE — Consult Note (Signed)
 PHARMACY - ANTICOAGULATION CONSULT NOTE  Pharmacy Consult for Heparin  Indication: pulmonary embolus  No Known Allergies  Patient Measurements: Height: 5\' 6"  (167.6 cm) Weight: 108.9 kg (240 lb 1.3 oz) IBW/kg (Calculated) : 63.8 Heparin Dosing Weight: 88.5 kg   Vital Signs: Temp: 98.5 F (36.9 C) (03/11 0000) Temp Source: Oral (03/11 0000) BP: 111/60 (03/11 0300) Pulse Rate: 84 (03/11 0300)  Labs: Recent Labs    05/28/23 1018 05/28/23 1240 05/28/23 1604 05/28/23 1819 05/29/23 0012 05/30/23 0430  HGB 11.9*  --   --   --  11.0* 10.6*  HCT 38.6*  --   --   --  34.5* 33.0*  PLT 188  --   --   --  185 173  APTT  --  29  --   --   --   --   LABPROT  --  14.6  --   --   --   --   INR  --  1.1  --   --   --   --   HEPARINUNFRC  --   --   --  0.59 0.46 0.28*  CREATININE 1.42*  --   --   --  1.30*  --   TROPONINIHS 50*  --  47*  --   --   --    Estimated Creatinine Clearance: 62 mL/min (A) (by C-G formula based on SCr of 1.3 mg/dL (H)).  Medical History: Past Medical History:  Diagnosis Date   Hypertension    Stroke [name])     Assessment: Travis Williams is a 68 y.o. male presenting with 2-3 days of worsening flank pain with associated SOB. PMH significant for ischemic stroke, UA, PVD, CAD, HTN, OSA. CT angio chest shows a large clot burden pulmonary embolism with saddle embolus across the main pulmonary artery. Patient was not on [facility] PTA per chart review. Pharmacy has been consulted to initiate and manage heparin infusion.   Baseline Labs: aPTT 29, PT 14.6, INR 1.1, Hgb 11.9, Hct 38.6, Plt 188   Goal of Therapy:  Heparin level 0.3-0.7 units/ml Monitor platelets by anticoagulation protocol: Yes   Date Time HL Rate/Comment  3/9 1819 0.59 1500/therapeutic x1 3/10 0012 0.46 1500/therapeutic x 2 3/11     0430    0.28     1500/SUBtherapeutic  Plan:  3/11:  HL @ 0430 = 0.28, SUBtherapeutic  - will order heparin 1300 units IV X 1 bolus and increase drip rate to 1650  units/hr - will recheck HL 6 hrs after rate change  Travis Williams 05/30/2023 5:05 AM

## 2023-05-30 NOTE — Consult Note (Signed)
 PHARMACY - ANTICOAGULATION CONSULT NOTE  Pharmacy Consult for Heparin transition to apixaban Indication: pulmonary embolus  No Known Allergies  Patient Measurements: Height: 5\' 6"  (167.6 cm) Weight: 99.5 kg (219 lb 5.7 oz) IBW/kg (Calculated) : 63.8 Heparin Dosing Weight: 88.5 kg   Vital Signs: Temp: 99.1 F (37.3 C) (03/11 0400) Temp Source: Oral (03/11 0400) BP: 113/56 (03/11 0700) Pulse Rate: 91 (03/11 0700)  Labs: Recent Labs    05/28/23 1018 05/28/23 1240 05/28/23 1604 05/28/23 1819 05/29/23 0012 05/30/23 0430  HGB 11.9*  --   --   --  11.0* 10.6*  HCT 38.6*  --   --   --  34.5* 33.0*  PLT 188  --   --   --  185 173  APTT  --  29  --   --   --   --   LABPROT  --  14.6  --   --   --   --   INR  --  1.1  --   --   --   --   HEPARINUNFRC  --   --   --  0.59 0.46 0.28*  CREATININE 1.42*  --   --   --  1.30*  --   TROPONINIHS 50*  --  47*  --   --   --    Estimated Creatinine Clearance: 59.2 mL/min (A) (by C-G formula based on SCr of 1.3 mg/dL (H)).  Medical History: Past Medical History:  Diagnosis Date   Hypertension    Stroke [facility])    Assessment: Travis Williams is a 68 y.o. male presenting with 2-3 days of worsening flank pain with associated SOB. PMH significant for ischemic stroke, UA, PVD, CAD, HTN, OSA. CT angio chest shows a large clot burden pulmonary embolism with saddle embolus across the main pulmonary artery. Patient was not on [facility] PTA per chart review. Pharmacy has been consulted to transition the patient from heparin to apixaban.   Baseline Labs:  aPTT 29, PT 14.6, INR 1.1, Hgb 11.9, Hct 38.6, Plt 188   Plan:  Transition patient to 10 mg BID apixaban x 7 days then 5 mg BID until completion of therapy   Effie Shy, PharmD Pharmacy Resident  05/30/2023 7:56 AM

## 2023-05-30 NOTE — Progress Notes (Signed)
 Travis Williams  A and O x 4. VSS. Pt tolerating diet well. No complaints of pain or nausea. IV removed intact, prescriptions given. Pt voiced understanding of discharge instructions with no further questions. Pt discharged via wheelchair to discharge lounge.     Allergies as of 05/30/2023   No Known Allergies      Medication List     STOP taking these medications    aspirin 81 MG chewable tablet   cilostazol 100 MG tablet Commonly known as: PLETAL       TAKE these medications    amLODipine 5 MG tablet Commonly known as: NORVASC Take 1 tablet (5 mg total) by mouth daily.   apixaban 5 MG Tabs tablet Commonly known as: ELIQUIS 2 tabs twice a day for 7 days followed by 1 tab twice a day   atorvastatin 40 MG tablet Commonly known as: LIPITOR TAKE 2 TABLETS(80 MG) BY MOUTH DAILY   labetalol 300 MG tablet Commonly known as: NORMODYNE TAKE 1 TABLET BY MOUTH TWICE DAILY   losartan-hydrochlorothiazide 100-25 MG tablet Commonly known as: Hyzaar Take 1 tablet by mouth daily.   sildenafil 50 MG tablet Commonly known as: Viagra Take 0.5 tablets (25 mg total) by mouth as needed for erectile dysfunction.               Durable Medical Equipment  (From admission, onward)           Start     Ordered   05/30/23 0942  For home use only DME oxygen  Once       Question Answer Comment  Length of Need 6 Months   Mode or (Route) Nasal cannula   Liters per Minute 2   Frequency Continuous (stationary and portable oxygen unit needed)   Oxygen delivery system Gas      05/30/23 0941            Vitals:   05/30/23 1100 05/30/23 1217  BP: (!) 105/54 (!) 96/54  Pulse: 87 80  Resp: (!) 25 (!) 21  Temp:  97.8 F (36.6 C)  SpO2: 91% 98%    Suzzanne Cloud

## 2023-05-30 NOTE — Discharge Summary (Signed)
 Travis Williams ZOX:[id number] DOB: 05/04/1955 DOA: 05/28/2023  PCP: Laurier Nancy, MD  Admit date: 05/28/2023 Discharge date: 05/30/2023  Time spent: 35 minutes  Recommendations for Outpatient Follow-up:  Close pcp f/u (who is also patient's cardiologist) Vascular surgery f/u 6 weeks     Discharge Diagnoses:  Principal Problem:   Pulmonary emboli (HCC) Active Problems:   Primary hypertension   PVD (peripheral vascular disease) (HCC)   CAD (coronary artery disease)   OSA (obstructive sleep apnea)   Acute pulmonary embolism (HCC)   PE (pulmonary thromboembolism) (HCC)   Acute deep vein thrombosis (DVT) of proximal vein of both lower extremities (HCC)   Discharge Condition: improved  Diet recommendation: heart healthy  Filed Weights   05/28/23 0929 05/28/23 1731 05/30/23 0600  Weight: 108.9 kg 108.9 kg 99.5 kg    History of present illness:  From admission h and p  Travis Williams is a 67 y.o. male with medical history significant of Stroke with embolectomy, CAD status post stenting, HTN, HLD, presented with worsening of left-sided chest pain.   Symptoms started 2 days ago, patient started to be a sharp-like chest pain on the left side, worsening with deep breath.  Denied any cough no fever chills no nausea vomiting.  Hospital Course:  Patient presents with shortness of breath. Found to have b/l DVT and saddle PE. Does not appear to be provoked. Treated with heparin drip. Vascular surgery consulted, mechanical thrombectomy of the PE was performed, with catheter placement. TTE no evidence of right heart strain or other significant findings. Symptomatically improved and ambulates without assistence, however does still have a 2 liter oxygen requirement, will discharge on 2 liters [place] O2. Advise close pcp f/u, will need o2 assessment then. Will hold home aspirin for the time being given start of apixaban. Consider outpatient hematology referral for thrombophilia w/u.  Procedures: See  above   Consultations: Vascular surgery  Discharge Exam: Vitals:   05/30/23 0900 05/30/23 1000  BP: 117/72 102/62  Pulse: 91 87  Resp: (!) 23 (!) 27  Temp:    SpO2: 92% 90%    General: NAD Cardiovascular: RRR Respiratory: CTAB Ext: warm, no edema  Discharge Instructions   Discharge Instructions     Diet - low sodium heart healthy   Complete by: As directed    Increase activity slowly   Complete by: As directed       Allergies as of 05/30/2023   No Known Allergies      Medication List     STOP taking these medications    aspirin 81 MG chewable tablet   cilostazol 100 MG tablet Commonly known as: PLETAL       TAKE these medications    amLODipine 5 MG tablet Commonly known as: NORVASC Take 1 tablet (5 mg total) by mouth daily.   apixaban 5 MG Tabs tablet Commonly known as: ELIQUIS 2 tabs twice a day for 7 days followed by 1 tab twice a day   atorvastatin 40 MG tablet Commonly known as: LIPITOR TAKE 2 TABLETS(80 MG) BY MOUTH DAILY   labetalol 300 MG tablet Commonly known as: NORMODYNE TAKE 1 TABLET BY MOUTH TWICE DAILY   losartan-hydrochlorothiazide 100-25 MG tablet Commonly known as: Hyzaar Take 1 tablet by mouth daily.   sildenafil 50 MG tablet Commonly known as: Viagra Take 0.5 tablets (25 mg total) by mouth as needed for erectile dysfunction.               Durable Medical Equipment  (  From admission, onward)           Start     Ordered   05/30/23 0942  For home use only DME oxygen  Once       Question Answer Comment  Length of Need 6 Months   Mode or (Route) Nasal cannula   Liters per Minute 2   Frequency Continuous (stationary and portable oxygen unit needed)   Oxygen delivery system Gas      05/30/23 0941           No Known Allergies  Follow-up Information     Georgiana Spinner, NP Follow up in 6 week(s).   Specialty: Vascular Surgery Why: Bilateral lower extremity U/S . Noted HX of Occlusive and non  occlusive DVT Contact information: [address] [place] [address] [phone]         Laurier Nancy, MD Follow up.   Specialty: Cardiology Contact information: [address] [place] [address] [phone]                  The results of significant diagnostics from this hospitalization (including imaging, microbiology, ancillary and laboratory) are listed below for reference.    Significant Diagnostic Studies: ECHOCARDIOGRAM COMPLETE Result Date: 05/30/2023    ECHOCARDIOGRAM REPORT   Patient Name:   Travis Williams Date of Exam: 05/29/2023 Medical Rec #:  [id number]    Height:       66.0 in Accession #:    [id number]   Weight:       240.1 lb Date of Birth:  12-23-1955    BSA:          2.161 m Patient Age:    68 years     BP:           142/84 mmHg Patient Gender: M            HR:           123 bpm. Exam Location:  [facility] Procedure: 2D Echo, Cardiac Doppler and Color Doppler (Both Spectral and Color            Flow Doppler were utilized during procedure). Indications:     Pulmonary Embolus I26.09  History:         Patient has prior history of Echocardiogram examinations, most                  recent 12/05/2020. Stroke; Risk Factors:Hypertension.  Sonographer:     Cristela Blue Referring Phys:  [id number] Marlow Baars DEW Diagnosing Phys: Rozell Searing Custovic IMPRESSIONS  1. Left ventricular ejection fraction, by estimation, is 60 to 65%. The left ventricle has normal function. The left ventricle has no regional wall motion abnormalities. Left ventricular diastolic parameters were normal.  2. Right ventricular systolic function is normal. The right ventricular size is normal.  3. The mitral valve is normal in structure. No evidence of mitral valve regurgitation. No evidence of mitral stenosis.  4. The aortic valve is normal in structure. Aortic valve regurgitation is not visualized. No aortic stenosis is present.  5. The inferior vena cava is normal in size with greater than 50%  respiratory variability, suggesting right atrial pressure of 3 mmHg. FINDINGS  Left Ventricle: Left ventricular ejection fraction, by estimation, is 60 to 65%. The left ventricle has normal function. The left ventricle has no regional wall motion abnormalities. The left ventricular internal cavity size was normal in size. There is  no left ventricular hypertrophy. Left ventricular  diastolic parameters were normal. Right Ventricle: The right ventricular size is normal. No increase in right ventricular wall thickness. Right ventricular systolic function is normal. Left Atrium: Left atrial size was normal in size. Right Atrium: Right atrial size was normal in size. Pericardium: There is no evidence of pericardial effusion. Mitral Valve: The mitral valve is normal in structure. No evidence of mitral valve regurgitation. No evidence of mitral valve stenosis. Tricuspid Valve: The tricuspid valve is normal in structure. Tricuspid valve regurgitation is trivial. Aortic Valve: The aortic valve is normal in structure. Aortic valve regurgitation is not visualized. No aortic stenosis is present. Aortic valve mean gradient measures 5.0 mmHg. Aortic valve peak gradient measures 9.1 mmHg. Aortic valve area, by VTI measures 3.04 cm. Pulmonic Valve: The pulmonic valve was normal in structure. Pulmonic valve regurgitation is not visualized. Aorta: The aortic root is normal in size and structure. Venous: The inferior vena cava is normal in size with greater than 50% respiratory variability, suggesting right atrial pressure of 3 mmHg. IAS/Shunts: No atrial level shunt detected by color flow Doppler.  LEFT VENTRICLE PLAX 2D LVIDd:         3.00 cm   Diastology LVIDs:         2.10 cm   LV e' medial:    3.70 cm/s LV PW:         0.90 cm   LV E/e' medial:  19.1 LV IVS:        1.30 cm   LV e' lateral:   6.74 cm/s LVOT diam:     2.00 cm   LV E/e' lateral: 10.5 LV SV:         68 LV SV Index:   31 LVOT Area:     3.14 cm  RIGHT VENTRICLE RV  Basal diam:  3.20 cm RV Mid diam:    2.50 cm RV S prime:     14.70 cm/s TAPSE (M-mode): 2.3 cm LEFT ATRIUM             Index        RIGHT ATRIUM           Index LA diam:        1.80 cm 0.83 cm/m   RA Area:     13.90 cm LA Vol (A2C):   26.5 ml 12.26 ml/m  RA Volume:   31.80 ml  14.71 ml/m LA Vol (A4C):   30.7 ml 14.20 ml/m LA Biplane Vol: 30.9 ml 14.30 ml/m  AORTIC VALVE AV Area (Vmax):    2.60 cm AV Area (Vmean):   2.59 cm AV Area (VTI):     3.04 cm AV Vmax:           151.00 cm/s AV Vmean:          104.000 cm/s AV VTI:            0.223 m AV Peak Grad:      9.1 mmHg AV Mean Grad:      5.0 mmHg LVOT Vmax:         125.00 cm/s LVOT Vmean:        85.600 cm/s LVOT VTI:          0.216 m LVOT/AV VTI ratio: 0.97  AORTA Ao Root diam: 2.80 cm MITRAL VALVE                TRICUSPID VALVE MV Area (PHT): 7.09 cm     TR Peak grad:   17.5 mmHg MV Decel  Time: 107 msec     TR Vmax:        209.00 cm/s MV E velocity: 70.70 cm/s MV A velocity: 122.00 cm/s  SHUNTS MV E/A ratio:  0.58         Systemic VTI:  0.22 m                             Systemic Diam: 2.00 cm Clotilde Dieter Electronically signed by Clotilde Dieter Signature Date/Time: 05/30/2023/8:34:47 AM    Final    PERIPHERAL VASCULAR CATHETERIZATION Result Date: 05/29/2023 See surgical note for result.  US Venous Img Lower Bilateral (DVT) Result Date: 05/28/2023 CLINICAL DATA:  Pulmonary emboli. EXAM: BILATERAL LOWER EXTREMITY VENOUS DOPPLER ULTRASOUND TECHNIQUE: Gray-scale sonography with graded compression, as well as color Doppler and duplex ultrasound were performed to evaluate the lower extremity deep venous systems from the level of the common femoral vein and including the common femoral, femoral, profunda femoral, popliteal and calf veins including the posterior tibial, peroneal and gastrocnemius veins when visible. The superficial great saphenous vein was also interrogated. Spectral Doppler was utilized to evaluate flow at rest and with distal augmentation  maneuvers in the common femoral, femoral and popliteal veins. COMPARISON:  None Available. FINDINGS: RIGHT LOWER EXTREMITY Common Femoral Vein: No evidence of thrombus. Normal compressibility. Saphenofemoral Junction: No evidence of thrombus. Normal compressibility and flow on color Doppler imaging. Profunda Femoral Vein: No evidence of thrombus. Normal compressibility and flow on color Doppler imaging. Femoral Vein: Nonocclusive thrombus. Popliteal Vein: Nonocclusive thrombus. Calf Veins: No evidence of thrombus. Normal compressibility and flow on color Doppler imaging. Other Findings:  None. LEFT LOWER EXTREMITY Common Femoral Vein: No evidence of thrombus. Normal compressibility, respiratory phasicity and response to augmentation. Saphenofemoral Junction: No evidence of thrombus. Normal compressibility and flow on color Doppler imaging. Profunda Femoral Vein: No evidence of thrombus. Normal compressibility and flow on color Doppler imaging. Femoral Vein: No evidence of thrombus. Normal compressibility, respiratory phasicity and response to augmentation. Popliteal Vein: No evidence of thrombus. Normal compressibility, respiratory phasicity and response to augmentation. Calf Veins: Occlusive thrombus in the posterior tibial vein. Other Findings:  None. IMPRESSION: 1. Nonocclusive thrombus in the right femoral and popliteal veins. 2. Occlusive thrombus in the left posterior tibial vein. Electronically Signed   By: Narda Rutherford M.D.   On: 05/28/2023 15:56   CT Angio Chest PE W and/or Wo Contrast Result Date: 05/28/2023 CLINICAL DATA:  Pain EXAM: CT ANGIOGRAPHY CHEST WITH CONTRAST TECHNIQUE: Multidetector CT imaging of the chest was performed using the standard protocol during bolus administration of intravenous contrast. Multiplanar CT image reconstructions and MIPs were obtained to evaluate the vascular anatomy. RADIATION DOSE REDUCTION: This exam was performed according to the departmental dose-optimization  program which includes automated exposure control, adjustment of the mA and/or kV according to patient size and/or use of iterative reconstruction technique. CONTRAST:  75mL OMNIPAQUE IOHEXOL 350 MG/ML SOLN COMPARISON:  Chest x-ray 05/28/2023 FINDINGS: Cardiovascular: Thoracic aorta has a normal course and caliber with partially calcified atherosclerotic plaque. Is also some plaque extending along the great vessels particularly the left subclavian artery. Coronary artery calcifications are seen. The heart is nonenlarged. Question of some left ventricular wall hypertrophy. No pericardial effusion. Bilateral pulmonary emboli are identified including a saddle embolus across the bifurcation of the main pulmonary artery. Significant areas of central bilateral lung emboli along the pulmonary arteries. Relatively large clot burden identified. There is slight flattening of the  intraventricular septum. Please correlate for other clinical evidence of right heart strain. Mediastinum/Nodes: Large hiatal hernia. Patulous esophagus. Preserved thyroid gland. No specific abnormal lymph node enlargement identified in the axillary regions, hilum or mediastinum. Lungs/Pleura: Breathing motion identified. Parenchymal opacity seen towards inferior aspect of lingula and peripheral left lower lobe. There is also some hazy ground-glass in the left upper lobe. Tiny left pleural effusion. Upper Abdomen: Adrenal glands are preserved in the upper abdomen. Stones in the nondilated gallbladder at the edge of the imaging field. Musculoskeletal: Scattered degenerative changes of the spine. Bridging osteophytes. Schmorl's node changes. Gynecomastia. Critical Value/emergent results were called by telephone at the time of interpretation on 05/28/2023 at 11:58 am to provider Willy Eddy , who verbally acknowledged these results. Review of the MIP images confirms the above findings. IMPRESSION: Large clot burden pulmonary embolism with saddle  embolus across the main pulmonary artery. Slight flattening of the intraventricular septum. Please correlate for clinical evidence of heart strain. Parenchymal opacity seen along the lingula and left lower lobe. Infiltrate versus infarct is in the differential. Recommend follow-up. Tiny left pleural effusion. Large hiatal hernia. Gallstones. Gynecomastia. Aortic Atherosclerosis (ICD10-I70.0). Electronically Signed   By: Karen Kays M.D.   On: 05/28/2023 12:03   DG Chest Portable 1 View Result Date: 05/28/2023 CLINICAL DATA:  Flank pain for 2 days. EXAM: PORTABLE CHEST 1 VIEW COMPARISON:  X-ray 12/03/2020. FINDINGS: Underinflation. Borderline cardiopericardial silhouette with some prominent central vasculature. Subtle opacity seen at both lung bases. Infiltrate is possible. Possible tiny left effusion as well. No pneumothorax. Film is under penetrated. Degenerative changes of the spine. IMPRESSION: Underinflation with slight lung base opacities. Borderline size heart.  Prominent central vasculature. Recommend follow-up Electronically Signed   By: Karen Kays M.D.   On: 05/28/2023 10:02    Microbiology: Recent Results (from the past 240 hours)  Resp panel by RT-PCR (RSV, Flu A&B, Covid) Anterior Nasal Swab     Status: None   Collection Time: 05/28/23 10:18 AM   Specimen: Anterior Nasal Swab  Result Value Ref Range Status   SARS Coronavirus 2 by RT PCR NEGATIVE NEGATIVE Final    Comment: (NOTE) SARS-CoV-2 target nucleic acids are NOT DETECTED.  The SARS-CoV-2 RNA is generally detectable in upper respiratory specimens during the acute phase of infection. The lowest concentration of SARS-CoV-2 viral copies this assay can detect is 138 copies/mL. A negative result does not preclude SARS-Cov-2 infection and should not be used as the sole basis for treatment or other patient management decisions. A negative result may occur with  improper specimen collection/handling, submission of specimen  other than nasopharyngeal swab, presence of viral mutation(s) within the areas targeted by this assay, and inadequate number of viral copies(<138 copies/mL). A negative result must be combined with clinical observations, patient history, and epidemiological information. The expected result is Negative.  Fact Sheet for Patients:  BloggerCourse.com  Fact Sheet for Healthcare Providers:  SeriousBroker.it  This test is no t yet approved or cleared by the [place] FDA and  has been authorized for detection and/or diagnosis of SARS-CoV-2 by FDA under an Emergency Use Authorization (EUA). This EUA will remain  in effect (meaning this test can be used) for the duration of the COVID-19 declaration under Section 564(b)(1) of the Act, 21 U.S.C.section 360bbb-3(b)(1), unless the authorization is terminated  or revoked sooner.       Influenza A by PCR NEGATIVE NEGATIVE Final   Influenza B by PCR NEGATIVE NEGATIVE Final    Comment: (  NOTE) The Xpert Xpress SARS-CoV-2/FLU/RSV plus assay is intended as an aid in the diagnosis of influenza from Nasopharyngeal swab specimens and should not be used as a sole basis for treatment. Nasal washings and aspirates are unacceptable for Xpert Xpress SARS-CoV-2/FLU/RSV testing.  Fact Sheet for Patients: BloggerCourse.com  Fact Sheet for Healthcare Providers: SeriousBroker.it  This test is not yet approved or cleared by the [place] FDA and has been authorized for detection and/or diagnosis of SARS-CoV-2 by FDA under an Emergency Use Authorization (EUA). This EUA will remain in effect (meaning this test can be used) for the duration of the COVID-19 declaration under Section 564(b)(1) of the Act, 21 U.S.C. section 360bbb-3(b)(1), unless the authorization is terminated or revoked.     Resp Syncytial Virus by PCR NEGATIVE NEGATIVE Final     Comment: (NOTE) Fact Sheet for Patients: BloggerCourse.com  Fact Sheet for Healthcare Providers: SeriousBroker.it  This test is not yet approved or cleared by the [place] FDA and has been authorized for detection and/or diagnosis of SARS-CoV-2 by FDA under an Emergency Use Authorization (EUA). This EUA will remain in effect (meaning this test can be used) for the duration of the COVID-19 declaration under Section 564(b)(1) of the Act, 21 U.S.C. section 360bbb-3(b)(1), unless the authorization is terminated or revoked.  Performed at [facility], [address] [place], [place] [address]   MRSA Next Gen by PCR, Nasal     Status: None   Collection Time: 05/28/23  5:35 PM   Specimen: Nasal Mucosa; Nasal Swab  Result Value Ref Range Status   MRSA by PCR Next Gen NOT DETECTED NOT DETECTED Final    Comment: (NOTE) The GeneXpert MRSA Assay (FDA approved for NASAL specimens only), is one component of a comprehensive MRSA colonization surveillance program. It is not intended to diagnose MRSA infection nor to guide or monitor treatment for MRSA infections. Test performance is not FDA approved in patients less than 62 years old. Performed at [facility], [address] [place], [place] [address]      Labs: Basic Metabolic Panel: Recent Labs  Lab 05/28/23 1018 05/29/23 0012  NA 134* 134*  K 3.9 3.4*  CL 100 101  CO2 26 24  GLUCOSE 160* 122*  BUN 22 22  CREATININE 1.42* 1.30*  CALCIUM 8.7* 8.5*   Liver Function Tests: No results for input(s): "AST", "ALT", "ALKPHOS", "BILITOT", "PROT", "ALBUMIN" in the last 168 hours. No results for input(s): "LIPASE", "AMYLASE" in the last 168 hours. No results for input(s): "AMMONIA" in the last 168 hours. CBC: Recent Labs  Lab 05/28/23 1018 05/29/23 0012 05/30/23 0430  WBC 10.4 9.9 10.3  HGB 11.9* 11.0* 10.6*  HCT 38.6* 34.5* 33.0*  MCV 83.2 80.8 80.5   PLT 188 185 173   Cardiac Enzymes: No results for input(s): "CKTOTAL", "CKMB", "CKMBINDEX", "TROPONINI" in the last 168 hours. BNP: BNP (last 3 results) Recent Labs    05/28/23 1018  BNP 52.5    ProBNP (last 3 results) No results for input(s): "PROBNP" in the last 8760 hours.  CBG: Recent Labs  Lab 05/28/23 1732 05/30/23 0808  GLUCAP 141* 98       Signed:  Silvano Bilis MD.  [facility] 05/30/2023, 10:26 AM

## 2023-05-30 NOTE — Progress Notes (Signed)
  Patient Saturations on Room Air at Rest = 87%  Patient Saturations on [employer] while Ambulating = 84%  Patient Saturations on 2 Liters of oxygen while Ambulating = 90%

## 2023-05-30 NOTE — TOC Transition Note (Signed)
 Transition of Care [facility]) - Discharge Note   Patient Details  Name: Travis Williams MRN: [id number] Date of Birth: 1955/09/19  [facility] CM/SW Contact:  Colin Broach, LCSW Phone Number: 05/30/2023, 12:55 PM   Clinical Narrative:    Patient to discharge home with 2L of O2.  CSW spoke with patient about preference.  He stated that he didn't have a preference.  CSW reached out to [employer] and spoke with [place].  Mitch spoke with pt and relayed information to CSW that pt has to put a card on file and patient has to clear balance on account.  Per Marthann Schiller, pt told him to contact CSW.  This CSW spoke with pt and informed him that it is his responsibility to clear his balance with [employer] and the representative will call back for the card information before equipment is delivered.  Pt states that he understands.   Final next level of care: Home/Self Care Barriers to Discharge:  (Patient to put card on file for O2 to be delivered through [employer])   Patient Goals and CMS Choice            Discharge Placement                       Discharge Plan and Services Additional resources added to the After Visit Summary for                  DME Arranged: Oxygen DME Agency: [employer] Date DME Agency Contacted: 05/30/23   Representative spoke with at DME Agency: Mitch @ [employer]            Social Drivers of Health (SDOH) Interventions SDOH Screenings   Food Insecurity: No Food Insecurity (05/28/2023)  Housing: Low Risk  (05/28/2023)  Transportation Needs: No Transportation Needs (05/28/2023)  Utilities: Not At Risk (05/28/2023)  Depression (PHQ2-9): Low Risk  (06/30/2021)  Social Connections: Socially Integrated (05/28/2023)  Tobacco Use: Medium Risk (05/28/2023)     Readmission Risk Interventions     No data to display

## 2023-05-30 NOTE — Telephone Encounter (Signed)
 Patient Product/process development scientist completed.    The patient is insured through [employer]. Patient has Medicare and is not eligible for a copay card, but may be able to apply for patient assistance or Medicare RX Payment Plan (Patient Must reach out to their plan, if eligible for payment plan), if available.    Ran test claim for Eliquis 5 mg and the current 30 day co-pay is $297.00 due to a $250.00 deductible.  Will be $47.00 once deductible is met.   This test claim was processed through [facility]- copay amounts may vary at other pharmacies due to pharmacy/plan contracts, or as the patient moves through the different stages of their insurance plan.     Roland Earl, CPHT Pharmacy Technician III Certified Patient Advocate [facility] Patient Advocate Team Direct Number: [phone]  Fax: [phone]

## 2023-05-30 NOTE — Progress Notes (Signed)
  Progress Note    05/30/2023 9:11 AM 1 Day Post-Op  Subjective: Travis Williams is a 68 year old male now POD #1 from pulmonary thrombectomy.  Patient is resting comfortably in bed this morning in ICU.  He endorses breathing much better today.  Patient remains on 2 L nasal cannula oxygen with oxygen saturations at 95%.  Nursing is asked to wean the oxygen off.  Patient remains on a heparin infusion this morning.  No complaints overnight.  Vitals are remained stable.   Vitals:   05/30/23 0700 05/30/23 0800  BP: (!) 113/56 (!) 112/55  Pulse: 91 91  Resp: 20 (!) 22  Temp:  98.6 F (37 C)  SpO2: 91% 92%   Physical Exam: Cardiac:  RRR, S1 and S2.  No murmurs appreciated Lungs: Nonlabored breathing, clear on auscultation throughout but diminished in the bases. Incisions: Right groin puncture site with dressing clean dry and intact.  No hematoma seroma to note. Extremities: Bilateral lower extremities warm to touch with palpable pulses. Abdomen: Positive bowel sounds throughout, morbidly obese, soft nontender nondistended. Neurologic: Alert and oriented x 3, answers all questions and follows commands appropriately.  CBC    Component Value Date/Time   WBC 10.3 05/30/2023 0430   RBC 4.10 (L) 05/30/2023 0430   HGB 10.6 (L) 05/30/2023 0430   HCT 33.0 (L) 05/30/2023 0430   PLT 173 05/30/2023 0430   MCV 80.5 05/30/2023 0430   MCH 25.9 (L) 05/30/2023 0430   MCHC 32.1 05/30/2023 0430   RDW 15.1 05/30/2023 0430   LYMPHSABS 3.7 12/03/2020 1302   MONOABS 0.8 12/03/2020 1302   EOSABS 0.1 12/03/2020 1302   BASOSABS 0.1 12/03/2020 1302    BMET    Component Value Date/Time   NA 134 (L) 05/29/2023 0012   K 3.4 (L) 05/29/2023 0012   CL 101 05/29/2023 0012   CO2 24 05/29/2023 0012   GLUCOSE 122 (H) 05/29/2023 0012   BUN 22 05/29/2023 0012   CREATININE 1.30 (H) 05/29/2023 0012   CALCIUM 8.5 (L) 05/29/2023 0012   GFRNONAA 59 (L) 05/29/2023 0012    INR    Component Value Date/Time    INR 1.1 05/28/2023 1240     Intake/Output Summary (Last 24 hours) at 05/30/2023 0911 Last data filed at 05/30/2023 0700 Gross per 24 hour  Intake 1066.26 ml  Output 900 ml  Net 166.26 ml     Assessment/Plan:  68 y.o. male is s/p pulmonary thrombectomy 1 Day Post-Op   Plan Stop heparin infusion this morning. Start Eliquis 10 mg twice daily for 7 days then convert to 5 mg twice daily indefinitely. Vascular surgery okay for patient to discharge home today once converted to oral anticoagulation. Patient to follow-up with vein and vascular surgery in 6 weeks as scheduled.  DVT prophylaxis: Heparin infusion   Marcie [facility] 05/30/2023 9:11 AM

## 2023-05-30 NOTE — Progress Notes (Signed)
  Chaplain On-Call responded to Spiritual Care Consult Order from Enedina Finner, MD.  The request was for Advance Directives information for the patient.  Chaplain met the patient, who stated that he has the AD documents at home. Chaplain gave an AD packet to him in case he wants to read over the documents. The patient stated that he anticipates hospital discharge soon today.  Chaplain Morene Crocker., [facility]

## 2023-05-30 NOTE — Progress Notes (Signed)
 RN notified Dr. Ashok Pall pt had a non-sustain 5 beat run vtach.

## 2023-06-08 ENCOUNTER — Encounter: Payer: Self-pay | Admitting: Cardiovascular Disease

## 2023-06-08 ENCOUNTER — Ambulatory Visit: Payer: Medicare HMO | Admitting: Cardiovascular Disease

## 2023-06-08 VITALS — BP 122/75 | HR 80 | Ht 66.0 in | Wt 237.0 lb

## 2023-06-08 DIAGNOSIS — I739 Peripheral vascular disease, unspecified: Secondary | ICD-10-CM | POA: Diagnosis not present

## 2023-06-08 DIAGNOSIS — I340 Nonrheumatic mitral (valve) insufficiency: Secondary | ICD-10-CM

## 2023-06-08 DIAGNOSIS — I25110 Atherosclerotic heart disease of native coronary artery with unstable angina pectoris: Secondary | ICD-10-CM

## 2023-06-08 DIAGNOSIS — I824Y3 Acute embolism and thrombosis of unspecified deep veins of proximal lower extremity, bilateral: Secondary | ICD-10-CM

## 2023-06-08 DIAGNOSIS — I10 Essential (primary) hypertension: Secondary | ICD-10-CM | POA: Diagnosis not present

## 2023-06-08 DIAGNOSIS — R0602 Shortness of breath: Secondary | ICD-10-CM

## 2023-06-08 DIAGNOSIS — I2692 Saddle embolus of pulmonary artery without acute cor pulmonale: Secondary | ICD-10-CM

## 2023-06-08 NOTE — Progress Notes (Signed)
 Cardiology Office Note   Date:  06/08/2023   ID:  Travis, Williams 23-Apr-1955, MRN [id number]  PCP:  Laurier Nancy, MD  Cardiologist:  Adrian Blackwater, MD      History of Present Illness: Travis Williams is a 68 y.o. male who presents for  Chief Complaint  Patient presents with   Follow-up    3 month follow up 2 liter     Has SOB, and as had DVT and pulmonary embli and admitted last Sunday.      Past Medical History:  Diagnosis Date   Hypertension    Stroke [facility])      Past Surgical History:  Procedure Laterality Date   CORONARY STENT INTERVENTION N/A 02/19/2021   Procedure: CORONARY STENT INTERVENTION;  Surgeon: Alwyn Pea, MD;  Location: [facility] INVASIVE CV LAB;  Service: Cardiovascular;  Laterality: N/A;   IR CT HEAD LTD  12/03/2020   IR INTRA CRAN STENT  12/03/2020   IR PERCUTANEOUS ART THROMBECTOMY/INFUSION INTRACRANIAL INC DIAG ANGIO  12/03/2020   IR RADIOLOGIST EVAL & MGMT  02/23/2021   IR US GUIDE VASC ACCESS RIGHT  12/03/2020   LEFT HEART CATH AND CORONARY ANGIOGRAPHY N/A 02/19/2021   Procedure: LEFT HEART CATH AND CORONARY ANGIOGRAPHY with intervention;  Surgeon: Laurier Nancy, MD;  Location: [facility] INVASIVE CV LAB;  Service: Cardiovascular;  Laterality: N/A;   PULMONARY THROMBECTOMY Bilateral 05/29/2023   Procedure: PULMONARY THROMBECTOMY;  Surgeon: Annice Needy, MD;  Location: [facility] INVASIVE CV LAB;  Service: Cardiovascular;  Laterality: Bilateral;   RADIOLOGY WITH ANESTHESIA N/A 12/03/2020   Procedure: IR WITH ANESTHESIA;  Surgeon: Radiologist, Medication, MD;  Location: [facility] OR;  Service: Radiology;  Laterality: N/A;     Current Outpatient Medications  Medication Sig Dispense Refill   amLODipine (NORVASC) 5 MG tablet Take 1 tablet (5 mg total) by mouth daily. 30 tablet 11   apixaban (ELIQUIS) 5 MG TABS tablet 2 tabs twice a day for 7 days followed by 1 tab twice a day 120 tablet 1   atorvastatin (LIPITOR) 40 MG tablet TAKE 2 TABLETS(80 MG) BY MOUTH DAILY  90 tablet 1   labetalol (NORMODYNE) 300 MG tablet TAKE 1 TABLET BY MOUTH TWICE DAILY 180 tablet 1   losartan-hydrochlorothiazide (HYZAAR) 100-25 MG tablet Take 1 tablet by mouth daily. 30 tablet 11   sildenafil (VIAGRA) 50 MG tablet Take 0.5 tablets (25 mg total) by mouth as needed for erectile dysfunction. 5 tablet 0   No current facility-administered medications for this visit.   Facility-Administered Medications Ordered in Other Visits  Medication Dose Route Frequency Provider Last Rate Last Admin   sodium chloride flush (NS) 0.9 % injection 3 mL  3 mL Intravenous Q12H Laurier Nancy, MD        Allergies:   Patient has no known allergies.    Social History:   reports that he quit smoking about 28 years ago. His smoking use included cigarettes. He started smoking about 33 years ago. He has a 5 pack-year smoking history. He has never used smokeless tobacco. He reports that he does not currently use alcohol. He reports that he does not use drugs.   Family History:  family history includes Hypertension in his brother.    ROS:     Review of Systems  Constitutional: Negative.   HENT: Negative.    Eyes: Negative.   Respiratory: Negative.    Gastrointestinal: Negative.   Genitourinary: Negative.   Musculoskeletal: Negative.   Skin: Negative.  Neurological: Negative.   Endo/Heme/Allergies: Negative.   Psychiatric/Behavioral: Negative.    All other systems reviewed and are negative.     All other systems are reviewed and negative.    PHYSICAL EXAM: VS:  BP 122/75   Pulse 80   Ht 5\' 6"  (1.676 m)   Wt 237 lb (107.5 kg)   SpO2 99%   BMI 38.25 kg/m  , BMI Body mass index is 38.25 kg/m. Last weight:  Wt Readings from Last 3 Encounters:  06/08/23 237 lb (107.5 kg)  05/30/23 219 lb 5.7 oz (99.5 kg)  03/07/23 247 lb 3.2 oz (112.1 kg)     Physical Exam Vitals reviewed.  Constitutional:      Appearance: Normal appearance. He is normal weight.  HENT:     Head:  Normocephalic.     Nose: Nose normal.     Mouth/Throat:     Mouth: Mucous membranes are moist.  Eyes:     Pupils: Pupils are equal, round, and reactive to light.  Cardiovascular:     Rate and Rhythm: Normal rate and regular rhythm.     Pulses: Normal pulses.     Heart sounds: Normal heart sounds.  Pulmonary:     Effort: Pulmonary effort is normal.  Abdominal:     General: Abdomen is flat. Bowel sounds are normal.  Musculoskeletal:        General: Normal range of motion.     Cervical back: Normal range of motion.  Skin:    General: Skin is warm.  Neurological:     General: No focal deficit present.     Mental Status: He is alert.  Psychiatric:        Mood and Affect: Mood normal.       EKG:   Recent Labs: 05/28/2023: B Natriuretic Peptide 52.5 05/29/2023: BUN 22; Creatinine, Ser 1.30; Potassium 3.4; Sodium 134 05/30/2023: Hemoglobin 10.6; Platelets 173    Lipid Panel    Component Value Date/Time   CHOL 148 09/12/2022 1341   TRIG 81 09/12/2022 1341   HDL 45 09/12/2022 1341   CHOLHDL 3.3 09/12/2022 1341   CHOLHDL 5.1 12/06/2020 0317   VLDL 24 12/06/2020 0317   LDLCALC 87 09/12/2022 1341   LDLDIRECT UNABLE TO CALCULATE IF TRIGLYCERIDE IS >1293 mg/dL 16/12/9602 5409      Other studies Reviewed: Additional studies/ records that were reviewed today include:  Review of the above records demonstrates:       No data to display            ASSESSMENT AND PLAN:    ICD-10-CM   1. Acute saddle pulmonary embolism without acute cor pulmonale (HCC)  I26.92    Had PE requiring mechanical thrombectomy on 06/01/23, may have coagulopathy as had B?L extremit DVT. Continue elliquis for life.    2. PVD (peripheral vascular disease) (HCC)  I73.9     3. Essential hypertension  I10     4. Coronary artery disease involving native coronary artery of native heart with unstable angina pectoris (HCC)  I25.110     5. Nonrheumatic mitral valve regurgitation  I34.0     6. SOB  (shortness of breath)  R06.02    No corpulmonale on echo with LVEF 60% normal    7. Acute deep vein thrombosis (DVT) of proximal vein of both lower extremities (HCC)  I82.4Y3    had DVT,LE BL, will need hematolgy f/u to see cause of coagulopathy.       Problem List Items Addressed  This Visit       Cardiovascular and Mediastinum   PVD (peripheral vascular disease) (HCC)   CAD (coronary artery disease)   Acute pulmonary embolism (HCC) - Primary   Acute deep vein thrombosis (DVT) of proximal vein of both lower extremities (HCC)   Other Visit Diagnoses       Essential hypertension         Nonrheumatic mitral valve regurgitation         SOB (shortness of breath)       No corpulmonale on echo with LVEF 60% normal          Disposition:   Return in about 4 weeks (around 07/06/2023).    Total time spent: 50 minutes  Signed,  Adrian Blackwater, MD  06/08/2023 9:47 AM    [facility]

## 2023-07-11 ENCOUNTER — Encounter: Payer: Self-pay | Admitting: Cardiovascular Disease

## 2023-07-11 ENCOUNTER — Other Ambulatory Visit (INDEPENDENT_AMBULATORY_CARE_PROVIDER_SITE_OTHER): Payer: Self-pay | Admitting: Vascular Surgery

## 2023-07-11 ENCOUNTER — Ambulatory Visit: Admitting: Cardiovascular Disease

## 2023-07-11 VITALS — BP 120/62 | HR 83 | Ht 66.0 in | Wt 233.6 lb

## 2023-07-11 DIAGNOSIS — I82442 Acute embolism and thrombosis of left tibial vein: Secondary | ICD-10-CM

## 2023-07-11 DIAGNOSIS — E782 Mixed hyperlipidemia: Secondary | ICD-10-CM

## 2023-07-11 DIAGNOSIS — I10 Essential (primary) hypertension: Secondary | ICD-10-CM

## 2023-07-11 DIAGNOSIS — I25110 Atherosclerotic heart disease of native coronary artery with unstable angina pectoris: Secondary | ICD-10-CM | POA: Diagnosis not present

## 2023-07-11 DIAGNOSIS — I2692 Saddle embolus of pulmonary artery without acute cor pulmonale: Secondary | ICD-10-CM | POA: Diagnosis not present

## 2023-07-11 DIAGNOSIS — R0602 Shortness of breath: Secondary | ICD-10-CM

## 2023-07-11 DIAGNOSIS — I340 Nonrheumatic mitral (valve) insufficiency: Secondary | ICD-10-CM | POA: Diagnosis not present

## 2023-07-11 DIAGNOSIS — I82411 Acute embolism and thrombosis of right femoral vein: Secondary | ICD-10-CM

## 2023-07-11 NOTE — Progress Notes (Signed)
 Cardiology Office Note   Date:  07/11/2023   ID:  Travis Williams, Travis Williams 1955-05-23, MRN [id number]  PCP:  Cherrie Cornwall, MD  Cardiologist:  Debborah Fairly, MD      History of Present Illness: Travis Williams is a 68 y.o. male who presents for  Chief Complaint  Patient presents with   Follow-up    4 week follow up    Doing well      Past Medical History:  Diagnosis Date   Hypertension    Stroke [facility])      Past Surgical History:  Procedure Laterality Date   CORONARY STENT INTERVENTION N/A 02/19/2021   Procedure: CORONARY STENT INTERVENTION;  Surgeon: Antonette Batters, MD;  Location: [facility] INVASIVE CV LAB;  Service: Cardiovascular;  Laterality: N/A;   IR CT HEAD LTD  12/03/2020   IR INTRA CRAN STENT  12/03/2020   IR PERCUTANEOUS ART THROMBECTOMY/INFUSION INTRACRANIAL INC DIAG ANGIO  12/03/2020   IR RADIOLOGIST EVAL & MGMT  02/23/2021   IR US  GUIDE VASC ACCESS RIGHT  12/03/2020   LEFT HEART CATH AND CORONARY ANGIOGRAPHY N/A 02/19/2021   Procedure: LEFT HEART CATH AND CORONARY ANGIOGRAPHY with intervention;  Surgeon: Cherrie Cornwall, MD;  Location: [facility] INVASIVE CV LAB;  Service: Cardiovascular;  Laterality: N/A;   PULMONARY THROMBECTOMY Bilateral 05/29/2023   Procedure: PULMONARY THROMBECTOMY;  Surgeon: Celso College, MD;  Location: [facility] INVASIVE CV LAB;  Service: Cardiovascular;  Laterality: Bilateral;   RADIOLOGY WITH ANESTHESIA N/A 12/03/2020   Procedure: IR WITH ANESTHESIA;  Surgeon: Radiologist, Medication, MD;  Location: [facility] OR;  Service: Radiology;  Laterality: N/A;     Current Outpatient Medications  Medication Sig Dispense Refill   amLODipine  (NORVASC ) 5 MG tablet Take 1 tablet (5 mg total) by mouth daily. 30 tablet 11   apixaban  (ELIQUIS ) 5 MG TABS tablet 2 tabs twice a day for 7 days followed by 1 tab twice a day 120 tablet 1   atorvastatin  (LIPITOR) 40 MG tablet TAKE 2 TABLETS(80 MG) BY MOUTH DAILY 90 tablet 1   labetalol  (NORMODYNE ) 300 MG tablet TAKE 1 TABLET BY  MOUTH TWICE DAILY 180 tablet 1   losartan -hydrochlorothiazide  (HYZAAR) 100-25 MG tablet Take 1 tablet by mouth daily. 30 tablet 11   sildenafil  (VIAGRA ) 50 MG tablet Take 0.5 tablets (25 mg total) by mouth as needed for erectile dysfunction. 5 tablet 0   No current facility-administered medications for this visit.   Facility-Administered Medications Ordered in Other Visits  Medication Dose Route Frequency Provider Last Rate Last Admin   sodium chloride  flush (NS) 0.9 % injection 3 mL  3 mL Intravenous Q12H Cherrie Cornwall, MD        Allergies:   Patient has no known allergies.    Social History:   reports that Travis Williams quit smoking about 28 years ago. His smoking use included cigarettes. Travis Williams started smoking about 33 years ago. Travis Williams has a 5 pack-year smoking history. Travis Williams has never used smokeless tobacco. Travis Williams reports that Travis Williams does not currently use alcohol. Travis Williams reports that Travis Williams does not use drugs.   Family History:  family history includes Hypertension in his brother.    ROS:     Review of Systems  Constitutional: Negative.   HENT: Negative.    Eyes: Negative.   Respiratory: Negative.    Gastrointestinal: Negative.   Genitourinary: Negative.   Musculoskeletal: Negative.   Skin: Negative.   Neurological: Negative.   Endo/Heme/Allergies: Negative.   Psychiatric/Behavioral: Negative.  All other systems reviewed and are negative.     All other systems are reviewed and negative.    PHYSICAL EXAM: VS:  BP 120/62   Pulse 83   Ht 5\' 6"  (1.676 m)   Wt 233 lb 9.6 oz (106 kg)   SpO2 96%   BMI 37.70 kg/m  , BMI Body mass index is 37.7 kg/m. Last weight:  Wt Readings from Last 3 Encounters:  07/11/23 233 lb 9.6 oz (106 kg)  06/08/23 237 lb (107.5 kg)  05/30/23 219 lb 5.7 oz (99.5 kg)     Physical Exam Vitals reviewed.  Constitutional:      Appearance: Normal appearance. Travis Williams is normal weight.  HENT:     Head: Normocephalic.     Nose: Nose normal.     Mouth/Throat:     Mouth: Mucous  membranes are moist.  Eyes:     Pupils: Pupils are equal, round, and reactive to light.  Cardiovascular:     Rate and Rhythm: Normal rate and regular rhythm.     Pulses: Normal pulses.     Heart sounds: Normal heart sounds.  Pulmonary:     Effort: Pulmonary effort is normal.  Abdominal:     General: Abdomen is flat. Bowel sounds are normal.  Musculoskeletal:        General: Normal range of motion.     Cervical back: Normal range of motion.  Skin:    General: Skin is warm.  Neurological:     General: No focal deficit present.     Mental Status: Travis Williams is alert.  Psychiatric:        Mood and Affect: Mood normal.       EKG:   Recent Labs: 05/28/2023: B Natriuretic Peptide 52.5 05/29/2023: BUN 22; Creatinine, Ser 1.30; Potassium 3.4; Sodium 134 05/30/2023: Hemoglobin 10.6; Platelets 173    Lipid Panel    Component Value Date/Time   CHOL 148 09/12/2022 1341   TRIG 81 09/12/2022 1341   HDL 45 09/12/2022 1341   CHOLHDL 3.3 09/12/2022 1341   CHOLHDL 5.1 12/06/2020 0317   VLDL 24 12/06/2020 0317   LDLCALC 87 09/12/2022 1341   LDLDIRECT UNABLE TO CALCULATE IF TRIGLYCERIDE IS >1293 mg/dL 14/78/2956 2130      Other studies Reviewed: Additional studies/ records that were reviewed today include:  Review of the above records demonstrates:       No data to display            ASSESSMENT AND PLAN:    ICD-10-CM   1. Coronary artery disease involving native coronary artery of native heart with unstable angina pectoris (HCC)  I25.110    no chest pains.    2. Essential hypertension, benign  I10     3. Mixed hyperlipidemia  E78.2     4. SOB (shortness of breath)  R06.02     5. Nonrheumatic mitral valve regurgitation  I34.0     6. Acute saddle pulmonary embolism without acute cor pulmonale (HCC)  I26.92    No SOB       Problem List Items Addressed This Visit       Cardiovascular and Mediastinum   CAD (coronary artery disease) - Primary   Acute pulmonary embolism  (HCC)     Other   HLD (hyperlipidemia)   Other Visit Diagnoses       Essential hypertension, benign         SOB (shortness of breath)         Nonrheumatic  mitral valve regurgitation              Disposition:   Return in about 3 months (around 10/10/2023).    Total time spent: 40 minutes  Signed,  Debborah Fairly, MD  07/11/2023 10:52 AM    [facility]

## 2023-07-12 ENCOUNTER — Ambulatory Visit (INDEPENDENT_AMBULATORY_CARE_PROVIDER_SITE_OTHER): Admitting: Nurse Practitioner

## 2023-07-12 ENCOUNTER — Encounter (INDEPENDENT_AMBULATORY_CARE_PROVIDER_SITE_OTHER): Payer: Self-pay | Admitting: Nurse Practitioner

## 2023-07-12 ENCOUNTER — Ambulatory Visit (INDEPENDENT_AMBULATORY_CARE_PROVIDER_SITE_OTHER)

## 2023-07-12 VITALS — BP 124/76 | HR 77 | Resp 18 | Ht 66.0 in | Wt 234.0 lb

## 2023-07-12 DIAGNOSIS — I82411 Acute embolism and thrombosis of right femoral vein: Secondary | ICD-10-CM | POA: Diagnosis not present

## 2023-07-12 DIAGNOSIS — I10 Essential (primary) hypertension: Secondary | ICD-10-CM

## 2023-07-12 DIAGNOSIS — I82442 Acute embolism and thrombosis of left tibial vein: Secondary | ICD-10-CM | POA: Diagnosis not present

## 2023-07-12 DIAGNOSIS — I824Y3 Acute embolism and thrombosis of unspecified deep veins of proximal lower extremity, bilateral: Secondary | ICD-10-CM

## 2023-07-12 DIAGNOSIS — I2699 Other pulmonary embolism without acute cor pulmonale: Secondary | ICD-10-CM | POA: Diagnosis not present

## 2023-07-12 IMAGING — MR MR HEAD W/O CM
12 of 13 series · 44 of 48 positions shown · non-contrast
Comparison: Correlation made with recent CT imaging.

CLINICAL DATA: Stroke, follow-up; Elvinn Aris and thrombectomy

EXAM:
MRI HEAD WITHOUT CONTRAST
TECHNIQUE: Multiplanar, multiecho pulse sequences of the brain and surrounding
structures were obtained without intravenous contrast.

[Series 5: DWI · axial · 3.0mm · 0.88mm/px · z∈[-68,+83]mm · 8 of 104 slices shown (1 of 4)]
[im 1/104]
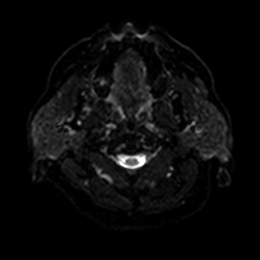
[im 15/104]
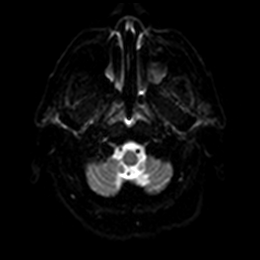
[im 30/104]
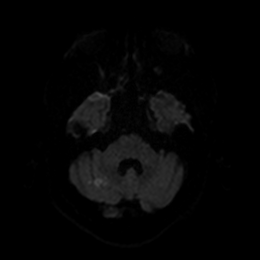
[im 45/104]
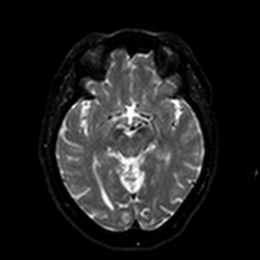
[im 59/104]
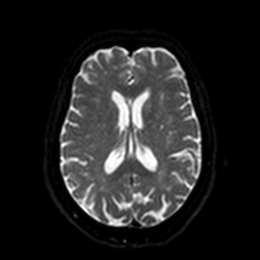
[im 74/104]
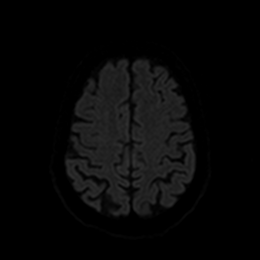
[im 89/104]
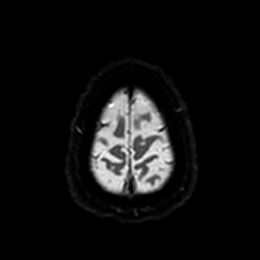
[im 104/104]
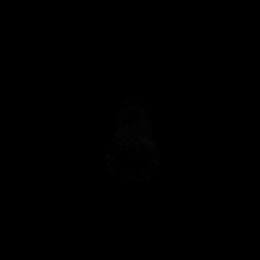

[Series 6: DWI · axial · 3.0mm · 0.88mm/px · z∈[-68,+83]mm · 4 of 52 slices shown (2 of 4)]
[im 1/52]
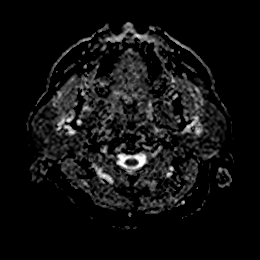
[im 18/52]
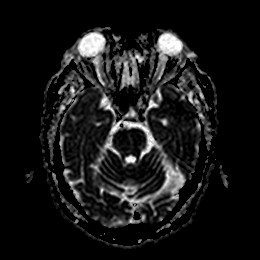
[im 35/52]
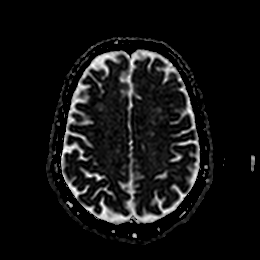
[im 52/52]
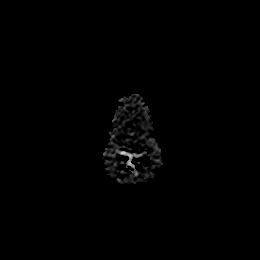

[Series 7: DWI · coronal · 4.0mm · 0.88mm/px · 5 of 72 slices shown (3 of 4)]
[im 1/72]
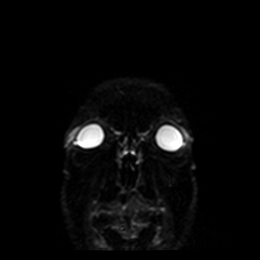
[im 18/72]
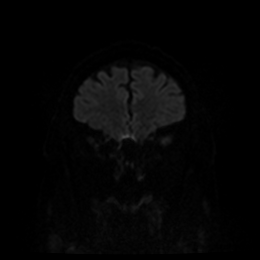
[im 36/72]
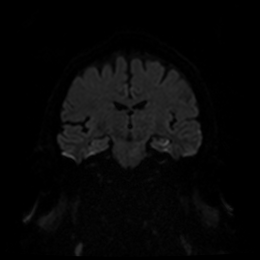
[im 54/72]
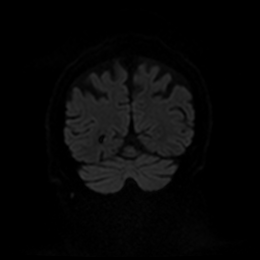
[im 72/72]
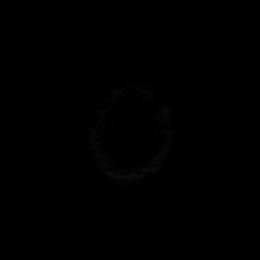

[Series 8: DWI · coronal · 4.0mm · 0.88mm/px · 3 of 36 slices shown (4 of 4)]
[im 1/36]
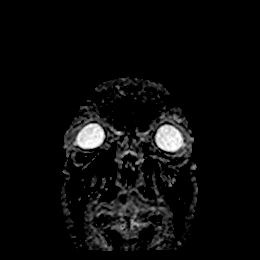
[im 18/36]
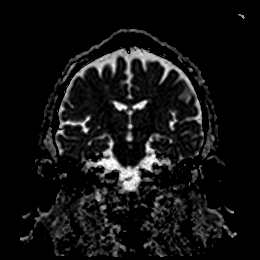
[im 36/36]
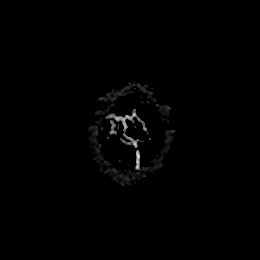

[Series 9: T1 · sagittal · 5.0mm · 0.78mm/px · 2 of 23 slices shown]
[im 1/23]
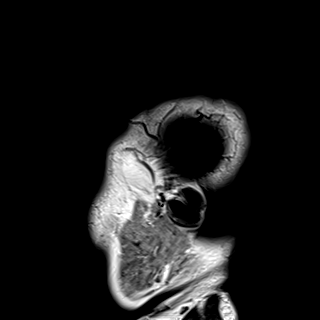
[im 23/23]
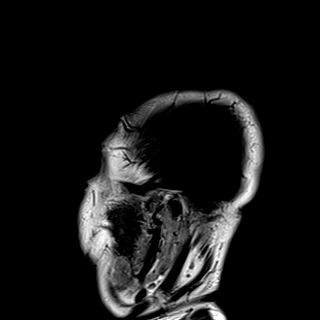

[Series 10: T2 · axial · 5.0mm · 0.75mm/px · z∈[-74,+69]mm · 2 of 25 slices shown (1 of 2)]
[im 1/25]
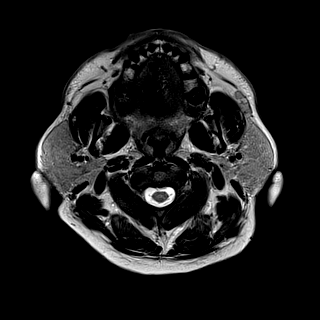
[im 25/25]
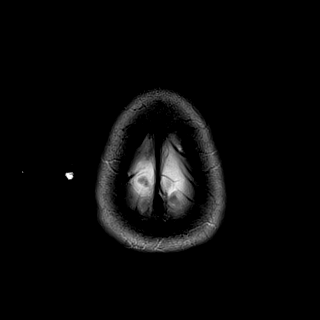

[Series 11: FLAIR · axial · 5.0mm · 0.47mm/px · z∈[-74,+69]mm · 2 of 25 slices shown]
[im 1/25]
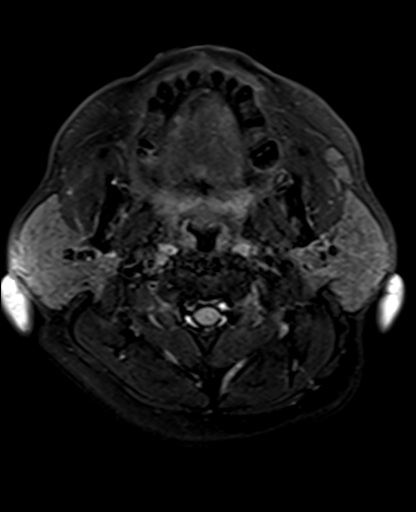
[im 25/25]
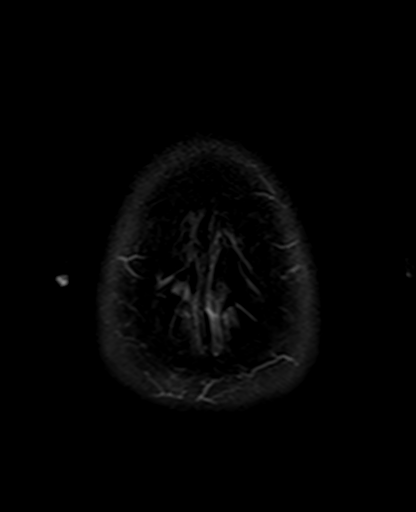

[Series 12: mag_images · axial · 3.0mm · 0.94mm/px · z∈[-74,+102]mm · 4 of 60 slices shown]
[im 1/60]
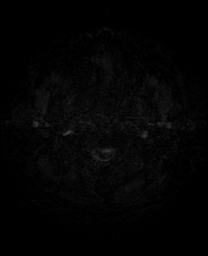
[im 20/60]
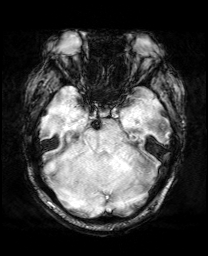
[im 40/60]
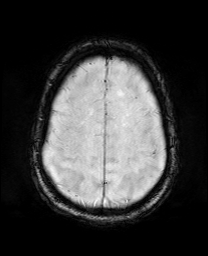
[im 60/60]
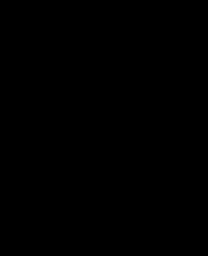

[Series 13: pha_images · axial · 3.0mm · 0.94mm/px · z∈[-74,+93]mm · 4 of 56 slices shown]
[im 1/56]
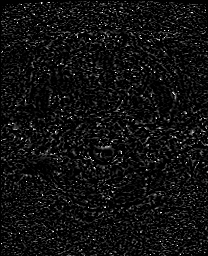
[im 19/56]
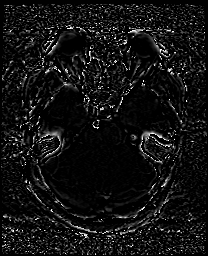
[im 37/56]
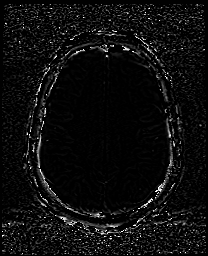
[im 56/56]
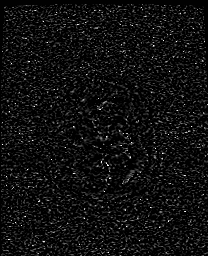

[Series 14: swi_images · axial · 3.0mm · 0.94mm/px · z∈[-74,+102]mm · 4 of 60 slices shown]
[im 1/60]
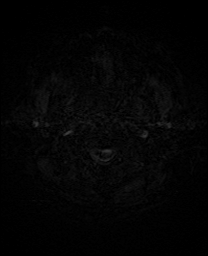
[im 20/60]
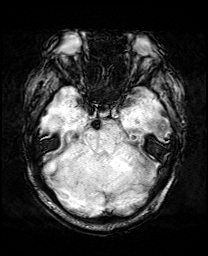
[im 40/60]
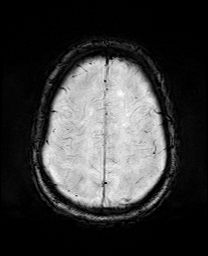
[im 60/60]
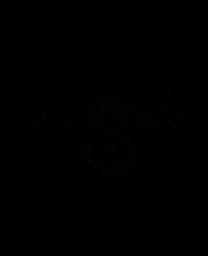

[Series 15: mip_images(sw) · axial · 24.0mm · 0.94mm/px · z∈[-63,+91]mm · 4 of 53 slices shown]
[im 1/53]
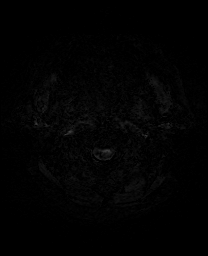
[im 18/53]
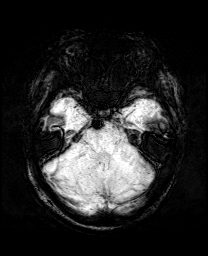
[im 35/53]
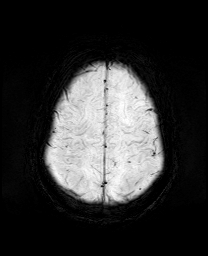
[im 53/53]
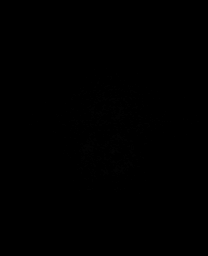

[Series 18: T2 · coronal · 5.0mm · 0.72mm/px · 2 of 29 slices shown (2 of 2)]
[im 1/29]
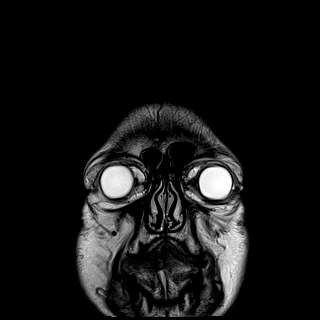
[im 29/29]
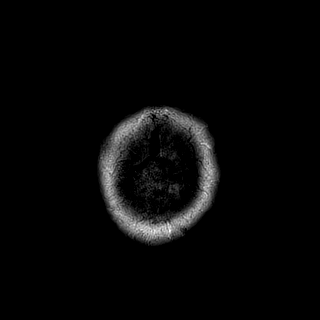

[44 of 48 positions shown; findings below may reference images not displayed]

FINDINGS: Brain: Foci of mildly reduced diffusion are present within the
cerebellum and occipital lobes bilaterally as well as the left
parasagittal pons and left hippocampus. There is minimal
susceptibility hypointensity some of these areas.

Additional patchy and confluent areas of T2 hyperintensity in the
supratentorial white matter are nonspecific but may reflect mild to
moderate chronic microvascular ischemic changes. There is no
intracranial mass or mass effect. There is no hydrocephalus or
extra-axial fluid collection.

Vascular: Major vessel flow voids at the skull base are preserved.

Skull and upper cervical spine: Normal marrow signal is preserved.

Sinuses/Orbits: Paranasal sinuses are aerated. Orbits are
unremarkable.

Other: Sella is unremarkable.  Mastoid air cells are clear.
IMPRESSION: Small acute infarcts involving bilateral cerebellum, occipital
lobes, left parasagittal pons, and left hippocampus. Minimal
petechial hemorrhage. Heidelberg classification 1a: HI1, scattered
small petechiae, no mass effect.

Chronic microvascular ischemic changes.

## 2023-07-17 NOTE — Progress Notes (Signed)
 Subjective:    Patient ID: Travis Williams, male    DOB: Nov 15, 1955, 68 y.o.   MRN: [id number] Chief Complaint  Patient presents with   Follow-up    6 weeks\ Bilateral lower extremity U/S . Noted HX of Occlusive and non occlusive DVT.    The patient is a 68 year old male who presents today after bilateral pulmonary thrombectomy on 05/29/2023.  The patient notes that his breathing is doing much better postintervention.  He also had noted bilateral DVTs with the right being worse than the left.  Today noninvasive studies show resolution of the previous DVTs.  No evidence is noted bilaterally today.  He denies any significant edema or postphlebitic symptoms.  There are no open wounds or ulcerations.    Review of Systems  Cardiovascular:  Negative for leg swelling.  All other systems reviewed and are negative.      Objective:   Physical Exam Vitals reviewed.  HENT:     Head: Normocephalic.  Cardiovascular:     Rate and Rhythm: Normal rate.     Pulses: Normal pulses.  Pulmonary:     Effort: Pulmonary effort is normal.     Breath sounds: Normal breath sounds.  Musculoskeletal:     Right lower leg: No edema.     Left lower leg: No edema.  Skin:    General: Skin is warm and dry.  Neurological:     Mental Status: He is alert and oriented to person, place, and time.  Psychiatric:        Mood and Affect: Mood normal.        Behavior: Behavior normal.        Thought Content: Thought content normal.        Judgment: Judgment normal.     BP 124/76   Pulse 77   Resp 18   Ht 5\' 6"  (1.676 m)   Wt 234 lb (106.1 kg)   BMI 37.77 kg/m   Past Medical History:  Diagnosis Date   Hypertension    Stroke [facility])     Social History   Socioeconomic History   Marital status: Single    Spouse name: Not on file   Number of children: 2   Years of education: Not on file   Highest education level: High school graduate  Occupational History   Not on file  Tobacco Use   Smoking status:  Former    Current packs/day: 0.00    Average packs/day: 1 pack/day for 5.0 years (5.0 ttl pk-yrs)    Types: Cigarettes    Start date: 81    Quit date: 53    Years since quitting: 28.3   Smokeless tobacco: Never  Vaping Use   Vaping status: Never Used  Substance and Sexual Activity   Alcohol use: Not Currently   Drug use: Never   Sexual activity: Not on file  Other Topics Concern   Not on file  Social History Narrative   Lives w roommate friend   Right handed   Caffeine: energy drink once a week   Social Drivers of Corporate investment banker Strain: Not on file  Food Insecurity: No Food Insecurity (05/28/2023)   Hunger Vital Sign    Worried About Running Out of Food in the Last Year: Never true    Ran Out of Food in the Last Year: Never true  Transportation Needs: No Transportation Needs (05/28/2023)   PRAPARE - Administrator, Civil Service (Medical): No  Lack of Transportation (Non-Medical): No  Physical Activity: Not on file  Stress: Not on file  Social Connections: Socially Integrated (05/28/2023)   Social Connection and Isolation Panel [NHANES]    Frequency of Communication with Friends and Family: More than three times a week    Frequency of Social Gatherings with Friends and Family: More than three times a week    Attends Religious Services: More than 4 times per year    Active Member of Clubs or Organizations: No    Attends Engineer, structural: More than 4 times per year    Marital Status: Living with partner  Intimate Partner Violence: Not At Risk (05/28/2023)   Humiliation, Afraid, Rape, and Kick questionnaire    Fear of Current or Ex-Partner: No    Emotionally Abused: No    Physically Abused: No    Sexually Abused: No    Past Surgical History:  Procedure Laterality Date   CORONARY STENT INTERVENTION N/A 02/19/2021   Procedure: CORONARY STENT INTERVENTION;  Surgeon: Antonette Batters, MD;  Location: [facility] INVASIVE CV LAB;  Service:  Cardiovascular;  Laterality: N/A;   IR CT HEAD LTD  12/03/2020   IR INTRA CRAN STENT  12/03/2020   IR PERCUTANEOUS ART THROMBECTOMY/INFUSION INTRACRANIAL INC DIAG ANGIO  12/03/2020   IR RADIOLOGIST EVAL & MGMT  02/23/2021   IR US  GUIDE VASC ACCESS RIGHT  12/03/2020   LEFT HEART CATH AND CORONARY ANGIOGRAPHY N/A 02/19/2021   Procedure: LEFT HEART CATH AND CORONARY ANGIOGRAPHY with intervention;  Surgeon: Cherrie Cornwall, MD;  Location: [facility] INVASIVE CV LAB;  Service: Cardiovascular;  Laterality: N/A;   PULMONARY THROMBECTOMY Bilateral 05/29/2023   Procedure: PULMONARY THROMBECTOMY;  Surgeon: Celso College, MD;  Location: [facility] INVASIVE CV LAB;  Service: Cardiovascular;  Laterality: Bilateral;   RADIOLOGY WITH ANESTHESIA N/A 12/03/2020   Procedure: IR WITH ANESTHESIA;  Surgeon: Radiologist, Medication, MD;  Location: [facility] OR;  Service: Radiology;  Laterality: N/A;    Family History  Problem Relation Age of Onset   Hypertension Brother     No Known Allergies     Latest Ref Rng & Units 05/30/2023    4:30 AM 05/29/2023   12:12 AM 05/28/2023   10:18 AM  CBC  WBC 4.0 - 10.5 K/uL 10.3  9.9  10.4   Hemoglobin 13.0 - 17.0 g/dL 62.1  30.8  65.7   Hematocrit 39.0 - 52.0 % 33.0  34.5  38.6   Platelets 150 - 400 K/uL 173  185  188       CMP     Component Value Date/Time   NA 134 (L) 05/29/2023 0012   K 3.4 (L) 05/29/2023 0012   CL 101 05/29/2023 0012   CO2 24 05/29/2023 0012   GLUCOSE 122 (H) 05/29/2023 0012   BUN 22 05/29/2023 0012   CREATININE 1.30 (H) 05/29/2023 0012   CALCIUM  8.5 (L) 05/29/2023 0012   PROT RESULTS UNAVAILABLE DUE TO INTERFERING SUBSTANCE 12/04/2020 0516   ALBUMIN 3.2 (L) 12/04/2020 0516   AST RESULTS UNAVAILABLE DUE TO INTERFERING SUBSTANCE 12/04/2020 0516   ALT RESULTS UNAVAILABLE DUE TO INTERFERING SUBSTANCE 12/04/2020 0516   ALKPHOS 59 12/04/2020 0516   BILITOT 2.6 (H) 12/04/2020 0516   GFRNONAA 59 (L) 05/29/2023 0012     No results found.     Assessment & Plan:    1. Acute deep vein thrombosis (DVT) of proximal vein of both lower extremities (HCC) (Primary) Recommend:   No surgery or intervention at this point  in time.  IVC filter is not indicated at present.  Patient's duplex ultrasound of the venous system resolution of his bilateral DVTs  The patient is on anticoagulation   Elevation was stressed, such as the use of a recliner.  I have reviewed with the patient DVT and post phlebitic changes such as swelling and why it  causes symptoms such as pain.  I recommended to the patient to wear graduated compression stockings, beginning after three full days of anticoagulation.  Graduated compression should be worn on a daily basis. The patient should wear compression beginning first thing in the morning and removing them in the evening. The patient is instructed specifically not to sleep in the stockings.  In addition, behavioral modification including elevation during the day and avoidance of prolonged dependency will be initiated.    The patient will continue anticoagulation for now as there have not been any problems or complications from anticoagulation therapy at this point.  The patient will follow-up with me with noninvasive studies as ordered.  2. Primary hypertension Continue antihypertensive medications as already ordered, these medications have been reviewed and there are no changes at this time.  3. PE (pulmonary thromboembolism) (HCC) The patient is doing well with anticoagulation.  Given his history of pulmonary embolism we will have her return in 1 year to discuss cessation or adjustment of anticoagulation.   Current Outpatient Medications on File Prior to Visit  Medication Sig Dispense Refill   amLODipine  (NORVASC ) 5 MG tablet Take 1 tablet (5 mg total) by mouth daily. 30 tablet 11   apixaban  (ELIQUIS ) 5 MG TABS tablet 2 tabs twice a day for 7 days followed by 1 tab twice a day 120 tablet 1   atorvastatin  (LIPITOR) 40 MG tablet TAKE  2 TABLETS(80 MG) BY MOUTH DAILY 90 tablet 1   labetalol  (NORMODYNE ) 300 MG tablet TAKE 1 TABLET BY MOUTH TWICE DAILY 180 tablet 1   losartan -hydrochlorothiazide  (HYZAAR) 100-25 MG tablet Take 1 tablet by mouth daily. 30 tablet 11   sildenafil  (VIAGRA ) 50 MG tablet Take 0.5 tablets (25 mg total) by mouth as needed for erectile dysfunction. 5 tablet 0   Current Facility-Administered Medications on File Prior to Visit  Medication Dose Route Frequency Provider Last Rate Last Admin   sodium chloride  flush (NS) 0.9 % injection 3 mL  3 mL Intravenous Q12H Cherrie Cornwall, MD        There are no Patient Instructions on file for this visit. No follow-ups on file.   Octavia Mottola E Larson Limones, NP

## 2023-07-25 ENCOUNTER — Encounter: Payer: Self-pay | Admitting: Internal Medicine

## 2023-07-25 ENCOUNTER — Ambulatory Visit (INDEPENDENT_AMBULATORY_CARE_PROVIDER_SITE_OTHER): Admitting: Internal Medicine

## 2023-07-25 VITALS — BP 122/68 | HR 76 | Ht 66.0 in | Wt 233.6 lb

## 2023-07-25 DIAGNOSIS — G4733 Obstructive sleep apnea (adult) (pediatric): Secondary | ICD-10-CM | POA: Diagnosis not present

## 2023-07-25 DIAGNOSIS — I739 Peripheral vascular disease, unspecified: Secondary | ICD-10-CM | POA: Diagnosis not present

## 2023-07-25 DIAGNOSIS — I639 Cerebral infarction, unspecified: Secondary | ICD-10-CM | POA: Diagnosis not present

## 2023-07-25 DIAGNOSIS — Z125 Encounter for screening for malignant neoplasm of prostate: Secondary | ICD-10-CM | POA: Diagnosis not present

## 2023-07-25 DIAGNOSIS — I25110 Atherosclerotic heart disease of native coronary artery with unstable angina pectoris: Secondary | ICD-10-CM | POA: Diagnosis not present

## 2023-07-25 DIAGNOSIS — D508 Other iron deficiency anemias: Secondary | ICD-10-CM

## 2023-07-25 DIAGNOSIS — E782 Mixed hyperlipidemia: Secondary | ICD-10-CM

## 2023-07-25 DIAGNOSIS — I10 Essential (primary) hypertension: Secondary | ICD-10-CM | POA: Diagnosis not present

## 2023-07-25 DIAGNOSIS — I824Y3 Acute embolism and thrombosis of unspecified deep veins of proximal lower extremity, bilateral: Secondary | ICD-10-CM

## 2023-07-25 DIAGNOSIS — Z1211 Encounter for screening for malignant neoplasm of colon: Secondary | ICD-10-CM

## 2023-07-25 DIAGNOSIS — I2692 Saddle embolus of pulmonary artery without acute cor pulmonale: Secondary | ICD-10-CM | POA: Diagnosis not present

## 2023-07-25 NOTE — Progress Notes (Signed)
 New Patient Office Visit  Subjective   Patient ID: Travis Williams, male    DOB: May 16, 1955  Age: 68 y.o. MRN: [id number]  CC:  Chief Complaint  Patient presents with   Establish Care    NPE, re-establishing care.    HPI Travis Williams presents to establish care Previous Primary Care provider/office:   he does not have additional concerns to discuss today.   Patient comes in to establish PMD.  His past medical history includes his an ischemic stroke with thrombectomy and stent placement in 2022.  Recently had bilateral DVT and a saddle pulmonary embolism status post thrombectomy.  He is currently on Eliquis , and has no new complaints.  Denies chest pain or shortness of breath, no leg pain, and does not have any residual neurological deficits. Patient needs blood work.  He has history of anemia.  Last colonoscopy was more than 10 years ago. Patient does not recall getting a clotting disorder workup, will add to labs. Denies nausea or vomiting, no melena, no urinary complaints.    Outpatient Encounter Medications as of 07/25/2023  Medication Sig   amLODipine  (NORVASC ) 5 MG tablet Take 1 tablet (5 mg total) by mouth daily.   apixaban  (ELIQUIS ) 5 MG TABS tablet 2 tabs twice a day for 7 days followed by 1 tab twice a day (Patient taking differently: Take 5 mg by mouth daily. 2 tabs twice a day for 7 days followed by 1 tab twice a day)   atorvastatin  (LIPITOR) 40 MG tablet TAKE 2 TABLETS(80 MG) BY MOUTH DAILY   labetalol  (NORMODYNE ) 300 MG tablet TAKE 1 TABLET BY MOUTH TWICE DAILY   losartan -hydrochlorothiazide  (HYZAAR) 100-25 MG tablet Take 1 tablet by mouth daily.   sildenafil  (VIAGRA ) 50 MG tablet Take 0.5 tablets (25 mg total) by mouth as needed for erectile dysfunction.   Facility-Administered Encounter Medications as of 07/25/2023  Medication   sodium chloride  flush (NS) 0.9 % injection 3 mL    Past Medical History:  Diagnosis Date   Hypertension    Stroke [facility])     Past  Surgical History:  Procedure Laterality Date   CORONARY STENT INTERVENTION N/A 02/19/2021   Procedure: CORONARY STENT INTERVENTION;  Surgeon: Antonette Batters, MD;  Location: [facility] INVASIVE CV LAB;  Service: Cardiovascular;  Laterality: N/A;   IR CT HEAD LTD  12/03/2020   IR INTRA CRAN STENT  12/03/2020   IR PERCUTANEOUS ART THROMBECTOMY/INFUSION INTRACRANIAL INC DIAG ANGIO  12/03/2020   IR RADIOLOGIST EVAL & MGMT  02/23/2021   IR US  GUIDE VASC ACCESS RIGHT  12/03/2020   LEFT HEART CATH AND CORONARY ANGIOGRAPHY N/A 02/19/2021   Procedure: LEFT HEART CATH AND CORONARY ANGIOGRAPHY with intervention;  Surgeon: Cherrie Cornwall, MD;  Location: [facility] INVASIVE CV LAB;  Service: Cardiovascular;  Laterality: N/A;   PULMONARY THROMBECTOMY Bilateral 05/29/2023   Procedure: PULMONARY THROMBECTOMY;  Surgeon: Celso College, MD;  Location: [facility] INVASIVE CV LAB;  Service: Cardiovascular;  Laterality: Bilateral;   RADIOLOGY WITH ANESTHESIA N/A 12/03/2020   Procedure: IR WITH ANESTHESIA;  Surgeon: Radiologist, Medication, MD;  Location: [facility] OR;  Service: Radiology;  Laterality: N/A;    Family History  Problem Relation Age of Onset   Hypertension Brother     Social History   Socioeconomic History   Marital status: Single    Spouse name: Not on file   Number of children: 2   Years of education: Not on file   Highest education level: High school graduate  Occupational  History   Not on file  Tobacco Use   Smoking status: Former    Current packs/day: 0.00    Average packs/day: 1 pack/day for 5.0 years (5.0 ttl pk-yrs)    Types: Cigarettes    Start date: 38    Quit date: 49    Years since quitting: 28.3   Smokeless tobacco: Never  Vaping Use   Vaping status: Never Used  Substance and Sexual Activity   Alcohol use: Not Currently   Drug use: Never   Sexual activity: Not on file  Other Topics Concern   Not on file  Social History Narrative   Lives w roommate friend   Right handed   Caffeine: energy  drink once a week   Social Drivers of Corporate investment banker Strain: Not on file  Food Insecurity: No Food Insecurity (05/28/2023)   Hunger Vital Sign    Worried About Running Out of Food in the Last Year: Never true    Ran Out of Food in the Last Year: Never true  Transportation Needs: No Transportation Needs (05/28/2023)   PRAPARE - Administrator, Civil Service (Medical): No    Lack of Transportation (Non-Medical): No  Physical Activity: Not on file  Stress: Not on file  Social Connections: Socially Integrated (05/28/2023)   Social Connection and Isolation Panel [NHANES]    Frequency of Communication with Friends and Family: More than three times a week    Frequency of Social Gatherings with Friends and Family: More than three times a week    Attends Religious Services: More than 4 times per year    Active Member of [employer] or Organizations: No    Attends Engineer, structural: More than 4 times per year    Marital Status: Living with partner  Intimate Partner Violence: Not At Risk (05/28/2023)   Humiliation, Afraid, Rape, and Kick questionnaire    Fear of Current or Ex-Partner: No    Emotionally Abused: No    Physically Abused: No    Sexually Abused: No    Review of Systems  Constitutional: Negative.  Negative for chills, fever, malaise/fatigue and weight loss.  HENT: Negative.  Negative for hearing loss and sore throat.   Eyes: Negative.   Respiratory: Negative.  Negative for cough and shortness of breath.   Cardiovascular: Negative.  Negative for chest pain, palpitations and leg swelling.  Gastrointestinal: Negative.  Negative for abdominal pain, constipation, diarrhea, heartburn, nausea and vomiting.  Genitourinary: Negative.  Negative for dysuria and flank pain.  Musculoskeletal: Negative.  Negative for joint pain and myalgias.  Skin: Negative.   Neurological: Negative.  Negative for dizziness, tingling, tremors, sensory change and headaches.   Endo/Heme/Allergies: Negative.   Psychiatric/Behavioral: Negative.  Negative for depression and suicidal ideas. The patient is not nervous/anxious.         Objective   BP 122/68   Pulse 76   Ht 5\' 6"  (1.676 m)   Wt 233 lb 9.6 oz (106 kg)   SpO2 97%   BMI 37.70 kg/m   Physical Exam Vitals and nursing note reviewed.  Constitutional:      Appearance: Normal appearance.  HENT:     Head: Normocephalic and atraumatic.     Nose: Nose normal.     Mouth/Throat:     Mouth: Mucous membranes are moist.     Pharynx: Oropharynx is clear.  Eyes:     Conjunctiva/sclera: Conjunctivae normal.     Pupils: Pupils are equal, round,  and reactive to light.  Cardiovascular:     Rate and Rhythm: Normal rate and regular rhythm.     Pulses: Normal pulses.     Heart sounds: Normal heart sounds.  Pulmonary:     Effort: Pulmonary effort is normal.     Breath sounds: Normal breath sounds.  Abdominal:     General: Bowel sounds are normal.     Palpations: Abdomen is soft.  Musculoskeletal:        General: Normal range of motion.     Cervical back: Normal range of motion.  Skin:    General: Skin is warm and dry.  Neurological:     General: No focal deficit present.     Mental Status: He is alert and oriented to person, place, and time.  Psychiatric:        Mood and Affect: Mood normal.        Behavior: Behavior normal.        Judgment: Judgment normal.        Assessment & Plan:  Continue current medications.  Check labs.  Check Hemoccult.  Will consider Cologuard. Problem List Items Addressed This Visit     Ischemic stroke (HCC)   Relevant Orders   Protein S activity   Factor 5 Mutation Leiden   Antiphospholipid syndrome eval, bld   PVD (peripheral vascular disease) (HCC)   Relevant Orders   Factor 5 Mutation Leiden   Antiphospholipid syndrome eval, bld   CAD (coronary artery disease)   HLD (hyperlipidemia)   Relevant Orders   Lipid Panel w/o Chol/HDL Ratio   OSA  (obstructive sleep apnea)   Acute pulmonary embolism (HCC)   Relevant Orders   Protein S activity   Factor 5 Mutation Leiden   Antiphospholipid syndrome eval, bld   Acute deep vein thrombosis (DVT) of proximal vein of both lower extremities (HCC)   Other Visit Diagnoses       Essential hypertension, benign    -  Primary   Relevant Orders   CMP14+EGFR     Other iron deficiency anemia       Relevant Orders   CBC with Diff   Sickle cell screen     Screening for prostate cancer       Relevant Orders   PSA       Return in about 2 weeks (around 08/08/2023).   Total time spent: 30 minutes  Aisha Hove, MD  07/25/2023   This document may have been prepared by [facility] Voice Recognition software and as such may include unintentional dictation errors.

## 2023-07-31 LAB — CMP14+EGFR
ALT: 15 IU/L (ref 0–44)
AST: 14 IU/L (ref 0–40)
Albumin: 4.3 g/dL (ref 3.9–4.9)
Alkaline Phosphatase: 93 IU/L (ref 44–121)
BUN/Creatinine Ratio: 10 (ref 10–24)
BUN: 12 mg/dL (ref 8–27)
Bilirubin Total: 0.6 mg/dL (ref 0.0–1.2)
CO2: 24 mmol/L (ref 20–29)
Calcium: 9 mg/dL (ref 8.6–10.2)
Chloride: 101 mmol/L (ref 96–106)
Creatinine, Ser: 1.22 mg/dL (ref 0.76–1.27)
Globulin, Total: 2.7 g/dL (ref 1.5–4.5)
Glucose: 116 mg/dL — ABNORMAL HIGH (ref 70–99)
Potassium: 3.6 mmol/L (ref 3.5–5.2)
Sodium: 139 mmol/L (ref 134–144)
Total Protein: 7 g/dL (ref 6.0–8.5)
eGFR: 64 mL/min/{1.73_m2} (ref >59)

## 2023-07-31 LAB — CBC WITH DIFFERENTIAL/PLATELET
Basophils Absolute: 0.1 10*3/uL (ref 0.0–0.2)
Basos: 1 %
EOS (ABSOLUTE): 0.1 10*3/uL (ref 0.0–0.4)
Eos: 2 %
Hematocrit: 33.4 % — ABNORMAL LOW (ref 37.5–51.0)
Hemoglobin: 10.5 g/dL — ABNORMAL LOW (ref 13.0–17.7)
Immature Grans (Abs): 0 10*3/uL (ref 0.0–0.1)
Immature Granulocytes: 0 %
Lymphocytes Absolute: 1.9 10*3/uL (ref 0.7–3.1)
Lymphs: 38 %
MCH: 25.4 pg — ABNORMAL LOW (ref 26.6–33.0)
MCHC: 31.4 g/dL — ABNORMAL LOW (ref 31.5–35.7)
MCV: 81 fL (ref 79–97)
Monocytes Absolute: 0.4 10*3/uL (ref 0.1–0.9)
Monocytes: 9 %
Neutrophils Absolute: 2.4 10*3/uL (ref 1.4–7.0)
Neutrophils: 50 %
Platelets: 268 10*3/uL (ref 150–450)
RBC: 4.14 x10E6/uL (ref 4.14–5.80)
RDW: 14.8 % (ref 11.6–15.4)
WBC: 4.9 10*3/uL (ref 3.4–10.8)

## 2023-07-31 LAB — LIPID PANEL W/O CHOL/HDL RATIO
Cholesterol, Total: 133 mg/dL (ref 100–199)
HDL: 39 mg/dL — ABNORMAL LOW (ref >39)
LDL Chol Calc (NIH): 78 mg/dL (ref 0–99)
Triglycerides: 80 mg/dL (ref 0–149)
VLDL Cholesterol Cal: 16 mg/dL (ref 5–40)

## 2023-07-31 LAB — FACTOR 5 LEIDEN

## 2023-07-31 LAB — COAG STUDIES INTERP REPORT

## 2023-07-31 LAB — ANTIPHOSPHOLIPID SYNDROME EVAL, BLD
APTT PPP: 22.9 s (ref 22.9–30.2)
Anticardiolipin IgG: <9 GPL U/mL (ref 0–14)
Anticardiolipin IgM: <9 [MPL'U]/mL (ref 0–12)
Beta-2 Glyco 1 IgM: <9 GPI IgM units (ref 0–32)
Beta-2 Glyco I IgG: <9 GPI IgG units (ref 0–20)
Dilute Viper Venom Time: 37.5 s (ref 0.0–47.0)
Hexagonal Phase Phospholipid: 3 s (ref 0–11)
INR: 0.9 (ref 0.9–1.2)
PT: 9.5 s (ref 9.1–12.0)
Thrombin Time: 24.3 s — ABNORMAL HIGH (ref 0.0–23.0)

## 2023-07-31 LAB — PROTEIN S ACTIVITY: Protein S Activity: 74 % (ref 63–140)

## 2023-07-31 LAB — SICKLE CELL SCREEN: Sickle Cell Screen: NEGATIVE

## 2023-07-31 LAB — PSA: Prostate Specific Ag, Serum: 1.3 ng/mL (ref 0.0–4.0)

## 2023-08-02 ENCOUNTER — Ambulatory Visit: Payer: Self-pay | Admitting: Family

## 2023-08-02 DIAGNOSIS — Z1211 Encounter for screening for malignant neoplasm of colon: Secondary | ICD-10-CM | POA: Diagnosis not present

## 2023-08-03 ENCOUNTER — Other Ambulatory Visit: Payer: Self-pay | Admitting: Neurology

## 2023-08-03 DIAGNOSIS — E782 Mixed hyperlipidemia: Secondary | ICD-10-CM

## 2023-08-07 ENCOUNTER — Other Ambulatory Visit: Payer: Self-pay

## 2023-08-07 DIAGNOSIS — I10 Essential (primary) hypertension: Secondary | ICD-10-CM

## 2023-08-07 MED ORDER — LABETALOL HCL 300 MG PO TABS: 300.0000 mg | ORAL_TABLET | Freq: Two times a day (BID) | ORAL | 1 refills | Status: DC

## 2023-08-08 ENCOUNTER — Encounter: Payer: Self-pay | Admitting: Internal Medicine

## 2023-08-08 ENCOUNTER — Ambulatory Visit (INDEPENDENT_AMBULATORY_CARE_PROVIDER_SITE_OTHER): Admitting: Internal Medicine

## 2023-08-08 VITALS — BP 132/70 | HR 76 | Ht 66.0 in | Wt 232.0 lb

## 2023-08-08 DIAGNOSIS — H538 Other visual disturbances: Secondary | ICD-10-CM

## 2023-08-08 DIAGNOSIS — E782 Mixed hyperlipidemia: Secondary | ICD-10-CM

## 2023-08-08 DIAGNOSIS — I739 Peripheral vascular disease, unspecified: Secondary | ICD-10-CM | POA: Diagnosis not present

## 2023-08-08 DIAGNOSIS — I25110 Atherosclerotic heart disease of native coronary artery with unstable angina pectoris: Secondary | ICD-10-CM | POA: Diagnosis not present

## 2023-08-08 MED ORDER — ATORVASTATIN CALCIUM 80 MG PO TABS: 80.0000 mg | ORAL_TABLET | Freq: Every day | ORAL | 3 refills | Status: AC

## 2023-08-08 NOTE — Progress Notes (Signed)
 Established Patient Office Visit  Subjective:  Patient ID: Travis Williams, male    DOB: [birth year]  Age: 68 y.o. MRN: [id number]  Chief Complaint  Patient presents with   Follow-up    2 week follow up    Patient comes in for his follow-up today.  He is generally feeling well and has no new complaints.  His labs were done at the last visit and the results discussed today.  He is taking his Eliquis  regularly.  No chest pain and no shortness of breath.  However mentions blurring of vision in his left eye, which is new.  His last eye exam was 2 to 3 years ago.  Will send a referral to the ophthalmology, as he has a history of blood clots, although he is on Eliquis .    No other concerns at this time.   Past Medical History:  Diagnosis Date   Hypertension    Stroke [facility])     Past Surgical History:  Procedure Laterality Date   CORONARY STENT INTERVENTION N/A 02/19/2021   Procedure: CORONARY STENT INTERVENTION;  Surgeon: Antonette Batters, MD;  Location: [facility] INVASIVE CV LAB;  Service: Cardiovascular;  Laterality: N/A;   IR CT HEAD LTD  12/03/2020   IR INTRA CRAN STENT  12/03/2020   IR PERCUTANEOUS ART THROMBECTOMY/INFUSION INTRACRANIAL INC DIAG ANGIO  12/03/2020   IR RADIOLOGIST EVAL & MGMT  02/23/2021   IR US  GUIDE VASC ACCESS RIGHT  12/03/2020   LEFT HEART CATH AND CORONARY ANGIOGRAPHY N/A 02/19/2021   Procedure: LEFT HEART CATH AND CORONARY ANGIOGRAPHY with intervention;  Surgeon: Cherrie Cornwall, MD;  Location: [facility] INVASIVE CV LAB;  Service: Cardiovascular;  Laterality: N/A;   PULMONARY THROMBECTOMY Bilateral 05/29/2023   Procedure: PULMONARY THROMBECTOMY;  Surgeon: Celso College, MD;  Location: [facility] INVASIVE CV LAB;  Service: Cardiovascular;  Laterality: Bilateral;   RADIOLOGY WITH ANESTHESIA N/A 12/03/2020   Procedure: IR WITH ANESTHESIA;  Surgeon: Radiologist, Medication, MD;  Location: [facility] OR;  Service: Radiology;  Laterality: N/A;    Social History   Socioeconomic History    Marital status: Single    Spouse name: Not on file   Number of children: 2   Years of education: Not on file   Highest education level: High school graduate  Occupational History   Not on file  Tobacco Use   Smoking status: Former    Current packs/day: 0.00    Average packs/day: 1 pack/day for 5.0 years (5.0 ttl pk-yrs)    Types: Cigarettes    Start date: 43    Quit date: 39    Years since quitting: 28.4   Smokeless tobacco: Never  Vaping Use   Vaping status: Never Used  Substance and Sexual Activity   Alcohol use: Not Currently   Drug use: Never   Sexual activity: Not on file  Other Topics Concern   Not on file  Social History Narrative   Lives w roommate friend   Right handed   Caffeine: energy drink once a week   Social Drivers of Corporate investment banker Strain: Not on file  Food Insecurity: No Food Insecurity (05/28/2023)   Hunger Vital Sign    Worried About Running Out of Food in the Last Year: Never true    Ran Out of Food in the Last Year: Never true  Transportation Needs: No Transportation Needs (05/28/2023)   PRAPARE - Administrator, Civil Service (Medical): No    Lack of Transportation (Non-Medical):  No  Physical Activity: Not on file  Stress: Not on file  Social Connections: Socially Integrated (05/28/2023)   Social Connection and Isolation Panel [NHANES]    Frequency of Communication with Friends and Family: More than three times a week    Frequency of Social Gatherings with Friends and Family: More than three times a week    Attends Religious Services: More than 4 times per year    Active Member of [employer] or Organizations: No    Attends Engineer, structural: More than 4 times per year    Marital Status: Living with partner  Intimate Partner Violence: Not At Risk (05/28/2023)   Humiliation, Afraid, Rape, and Kick questionnaire    Fear of Current or Ex-Partner: No    Emotionally Abused: No    Physically Abused: No    Sexually  Abused: No    Family History  Problem Relation Age of Onset   Hypertension Brother     No Known Allergies  Outpatient Medications Prior to Visit  Medication Sig   amLODipine  (NORVASC ) 5 MG tablet Take 1 tablet (5 mg total) by mouth daily.   apixaban  (ELIQUIS ) 5 MG TABS tablet 2 tabs twice a day for 7 days followed by 1 tab twice a day (Patient taking differently: Take 5 mg by mouth daily. 2 tabs twice a day for 7 days followed by 1 tab twice a day)   labetalol  (NORMODYNE ) 300 MG tablet Take 1 tablet (300 mg total) by mouth 2 (two) times daily.   losartan -hydrochlorothiazide  (HYZAAR) 100-25 MG tablet Take 1 tablet by mouth daily.   sildenafil  (VIAGRA ) 50 MG tablet Take 0.5 tablets (25 mg total) by mouth as needed for erectile dysfunction.   [DISCONTINUED] atorvastatin  (LIPITOR) 40 MG tablet TAKE 2 TABLETS(80 MG) BY MOUTH DAILY   Facility-Administered Medications Prior to Visit  Medication Dose Route Frequency Provider   sodium chloride  flush (NS) 0.9 % injection 3 mL  3 mL Intravenous Q12H Cherrie Cornwall, MD    Review of Systems  Constitutional: Negative.  Negative for chills, diaphoresis, fever, malaise/fatigue and weight loss.  HENT: Negative.  Negative for nosebleeds and sore throat.   Eyes: Negative.   Respiratory: Negative.  Negative for cough and shortness of breath.   Cardiovascular: Negative.  Negative for chest pain, palpitations and leg swelling.  Gastrointestinal: Negative.  Negative for abdominal pain, constipation, diarrhea, heartburn, nausea and vomiting.  Genitourinary: Negative.  Negative for dysuria and flank pain.  Musculoskeletal: Negative.  Negative for joint pain and myalgias.  Skin: Negative.   Neurological: Negative.  Negative for dizziness, tingling, tremors, sensory change and headaches.  Endo/Heme/Allergies: Negative.   Psychiatric/Behavioral: Negative.  Negative for depression and suicidal ideas. The patient is not nervous/anxious.        Objective:    BP 132/70   Pulse 76   Ht 5\' 6"  (1.676 m)   Wt 232 lb (105.2 kg)   SpO2 96%   BMI 37.45 kg/m   Vitals:   08/08/23 1131  BP: 132/70  Pulse: 76  Height: 5\' 6"  (1.676 m)  Weight: 232 lb (105.2 kg)  SpO2: 96%  BMI (Calculated): 37.46    Physical Exam Vitals and nursing note reviewed.  Constitutional:      Appearance: Normal appearance.  HENT:     Head: Normocephalic and atraumatic.     Nose: Nose normal.     Mouth/Throat:     Mouth: Mucous membranes are moist.     Pharynx: Oropharynx is clear.  Eyes:     Conjunctiva/sclera: Conjunctivae normal.     Pupils: Pupils are equal, round, and reactive to light.  Cardiovascular:     Rate and Rhythm: Normal rate and regular rhythm.     Pulses: Normal pulses.     Heart sounds: Normal heart sounds.  Pulmonary:     Effort: Pulmonary effort is normal.     Breath sounds: Normal breath sounds.  Abdominal:     General: Bowel sounds are normal.     Palpations: Abdomen is soft.  Musculoskeletal:        General: Normal range of motion.     Cervical back: Normal range of motion.  Skin:    General: Skin is warm and dry.  Neurological:     General: No focal deficit present.     Mental Status: He is alert and oriented to person, place, and time.  Psychiatric:        Mood and Affect: Mood normal.        Behavior: Behavior normal.        Judgment: Judgment normal.      No results found for any visits on 08/08/23.  Recent Results (from the past 2160 hours)  CBC     Status: Abnormal   Collection Time: 05/28/23 10:18 AM  Result Value Ref Range   WBC 10.4 4.0 - 10.5 K/uL   RBC 4.64 4.22 - 5.81 MIL/uL   Hemoglobin 11.9 (L) 13.0 - 17.0 g/dL   HCT 16.1 (L) 09.6 - 04.5 %   MCV 83.2 80.0 - 100.0 fL   MCH 25.6 (L) 26.0 - 34.0 pg   MCHC 30.8 30.0 - 36.0 g/dL   RDW 40.9 81.1 - 91.4 %   Platelets 188 150 - 400 K/uL   nRBC 0.0 0.0 - 0.2 %    Comment: Performed at [facility], [address] [place], [place] [address]   Basic metabolic panel     Status: Abnormal   Collection Time: 05/28/23 10:18 AM  Result Value Ref Range   Sodium 134 (L) 135 - 145 mmol/L   Potassium 3.9 3.5 - 5.1 mmol/L   Chloride 100 98 - 111 mmol/L   CO2 26 22 - 32 mmol/L   Glucose, Bld 160 (H) 70 - 99 mg/dL    Comment: Glucose reference range applies only to samples taken after fasting for at least 8 hours.   BUN 22 8 - 23 mg/dL   Creatinine, Ser 6.21 (H) 0.61 - 1.24 mg/dL   Calcium  8.7 (L) 8.9 - 10.3 mg/dL   GFR, Estimated 53 (L) >60 mL/min    Comment: (NOTE) Calculated using the CKD-EPI Creatinine Equation (2021)    Anion gap 8 5 - 15    Comment: Performed at [facility], [address] [place], [place] [address]  Resp panel by RT-PCR (RSV, Flu A&B, Covid) Anterior Nasal Swab     Status: None   Collection Time: 05/28/23 10:18 AM   Specimen: Anterior Nasal Swab  Result Value Ref Range   SARS Coronavirus 2 by RT PCR NEGATIVE NEGATIVE    Comment: (NOTE) SARS-CoV-2 target nucleic acids are NOT DETECTED.  The SARS-CoV-2 RNA is generally detectable in upper respiratory specimens during the acute phase of infection. The lowest concentration of SARS-CoV-2 viral copies this assay can detect is 138 copies/mL. A negative result does not preclude SARS-Cov-2 infection and should not be used as the sole basis for treatment or other patient management decisions. A negative result may  occur with  improper specimen collection/handling, submission of specimen other than nasopharyngeal swab, presence of viral mutation(s) within the areas targeted by this assay, and inadequate number of viral copies(<138 copies/mL). A negative result must be combined with clinical observations, patient history, and epidemiological information. The expected result is Negative.  Fact Sheet for Patients:  BloggerCourse.com  Fact Sheet for Healthcare Providers:  SeriousBroker.it  This test is  no t yet approved or cleared by the [place]  FDA and  has been authorized for detection and/or diagnosis of SARS-CoV-2 by FDA under an Emergency Use Authorization (EUA). This EUA will remain  in effect (meaning this test can be used) for the duration of the COVID-19 declaration under Section 564(b)(1) of the Act, 21 U.S.C.section 360bbb-3(b)(1), unless the authorization is terminated  or revoked sooner.       Influenza A by PCR NEGATIVE NEGATIVE   Influenza B by PCR NEGATIVE NEGATIVE    Comment: (NOTE) The Xpert Xpress SARS-CoV-2/FLU/RSV plus assay is intended as an aid in the diagnosis of influenza from Nasopharyngeal swab specimens and should not be used as a sole basis for treatment. Nasal washings and aspirates are unacceptable for Xpert Xpress SARS-CoV-2/FLU/RSV testing.  Fact Sheet for Patients: BloggerCourse.com  Fact Sheet for Healthcare Providers: SeriousBroker.it  This test is not yet approved or cleared by the [place]  FDA and has been authorized for detection and/or diagnosis of SARS-CoV-2 by FDA under an Emergency Use Authorization (EUA). This EUA will remain in effect (meaning this test can be used) for the duration of the COVID-19 declaration under Section 564(b)(1) of the Act, 21 U.S.C. section 360bbb-3(b)(1), unless the authorization is terminated or revoked.     Resp Syncytial Virus by PCR NEGATIVE NEGATIVE    Comment: (NOTE) Fact Sheet for Patients: BloggerCourse.com  Fact Sheet for Healthcare Providers: SeriousBroker.it  This test is not yet approved or cleared by the [place]  FDA and has been authorized for detection and/or diagnosis of SARS-CoV-2 by FDA under an Emergency Use Authorization (EUA). This EUA will remain in effect (meaning this test can be used) for the duration of the COVID-19 declaration under Section 564(b)(1) of the Act,  21 U.S.C. section 360bbb-3(b)(1), unless the authorization is terminated or revoked.  Performed at [facility], [address] [place], [place] [address]   Lactic acid, plasma     Status: None   Collection Time: 05/28/23 10:18 AM  Result Value Ref Range   Lactic Acid, Venous 1.5 0.5 - 1.9 mmol/L    Comment: Performed at [facility], [address] [place], [place] [address]  Brain natriuretic peptide     Status: None   Collection Time: 05/28/23 10:18 AM  Result Value Ref Range   B Natriuretic Peptide 52.5 0.0 - 100.0 pg/mL    Comment: Performed at [facility], [address] [place], [place] [address]  Troponin I (High Sensitivity)     Status: Abnormal   Collection Time: 05/28/23 10:18 AM  Result Value Ref Range   Troponin I (High Sensitivity) 50 (H) <18 ng/L    Comment: (NOTE) Elevated high sensitivity troponin I (hsTnI) values and significant  changes across serial measurements may suggest ACS but many other  chronic and acute conditions are known to elevate hsTnI results.  Refer to the "Links" section for chest pain algorithms and additional  guidance. Performed at [facility], [address] [place], [place] [address]   Lactic acid, plasma     Status: None  Collection Time: 05/28/23 11:34 AM  Result Value Ref Range   Lactic Acid, Venous 1.5 0.5 - 1.9 mmol/L    Comment: Performed at [facility], [address] [place], [place] [address]  Protime-INR     Status: None   Collection Time: 05/28/23 12:40 PM  Result Value Ref Range   Prothrombin Time 14.6 11.4 - 15.2 seconds   INR 1.1 0.8 - 1.2    Comment: (NOTE) INR goal varies based on device and disease states. Performed at [facility], [address] [place], [place] [address]   APTT     Status: None   Collection Time: 05/28/23 12:40 PM  Result Value Ref Range   aPTT 29 24 - 36 seconds    Comment: Performed at [facility], [address] [place], [place] [address]  HIV Antibody (routine testing w rflx)     Status: None   Collection Time: 05/28/23  4:04 PM  Result Value Ref Range   HIV Screen 4th Generation wRfx Non Reactive Non Reactive    Comment: Performed at [facility], [address] [place], [place] [address]  Troponin I (High Sensitivity)     Status: Abnormal   Collection Time: 05/28/23  4:04 PM  Result Value Ref Range   Troponin I (High Sensitivity) 47 (H) <18 ng/L    Comment: (NOTE) Elevated high sensitivity troponin I (hsTnI) values and significant  changes across serial measurements may suggest ACS but many other  chronic and acute conditions are known to elevate hsTnI results.  Refer to the "Links" section for chest pain algorithms and additional  guidance. Performed at [facility], [address] [place], [place] [address]   Glucose, capillary     Status: Abnormal   Collection Time: 05/28/23  5:32 PM  Result Value Ref Range   Glucose-Capillary 141 (H) 70 - 99 mg/dL    Comment: Glucose reference range applies only to samples taken after fasting for at least 8 hours.  MRSA Next Gen by PCR, Nasal     Status: None   Collection Time: 05/28/23  5:35 PM   Specimen: Nasal Mucosa; Nasal Swab  Result Value Ref Range   MRSA by PCR Next Gen NOT DETECTED NOT DETECTED    Comment: (NOTE) The GeneXpert MRSA Assay (FDA approved for NASAL specimens only), is one component of a comprehensive MRSA colonization surveillance program. It is not intended to diagnose MRSA infection nor to guide or monitor treatment for MRSA infections. Test performance is not FDA approved in patients less than 58 years old. Performed at [facility], [address] [place], [place] [address]   Heparin  level (unfractionated)     Status: None   Collection Time: 05/28/23  6:19 PM  Result Value Ref Range   Heparin  Unfractionated 0.59 0.30 - 0.70 IU/mL    Comment: (NOTE) The clinical reportable range upper  limit is being lowered to >1.10 to align with the FDA approved guidance for the current laboratory assay.  If heparin  results are below expected values, and patient dosage has  been confirmed, suggest follow up testing of antithrombin III levels. Performed at [facility], [address] [place], [place] [address]   CBC     Status: Abnormal   Collection Time: 05/29/23 12:12 AM  Result Value Ref Range   WBC 9.9 4.0 - 10.5 K/uL   RBC 4.27 4.22 - 5.81 MIL/uL   Hemoglobin 11.0 (L) 13.0 - 17.0 g/dL  HCT 34.5 (L) 39.0 - 52.0 %   MCV 80.8 80.0 - 100.0 fL   MCH 25.8 (L) 26.0 - 34.0 pg   MCHC 31.9 30.0 - 36.0 g/dL   RDW 81.1 91.4 - 78.2 %   Platelets 185 150 - 400 K/uL   nRBC 0.0 0.0 - 0.2 %    Comment: Performed at [facility], [address] [place], [place] [address]  Basic metabolic panel     Status: Abnormal   Collection Time: 05/29/23 12:12 AM  Result Value Ref Range   Sodium 134 (L) 135 - 145 mmol/L   Potassium 3.4 (L) 3.5 - 5.1 mmol/L   Chloride 101 98 - 111 mmol/L   CO2 24 22 - 32 mmol/L   Glucose, Bld 122 (H) 70 - 99 mg/dL    Comment: Glucose reference range applies only to samples taken after fasting for at least 8 hours.   BUN 22 8 - 23 mg/dL   Creatinine, Ser 3.08 (H) 0.61 - 1.24 mg/dL   Calcium  8.5 (L) 8.9 - 10.3 mg/dL   GFR, Estimated 59 (L) >60 mL/min    Comment: (NOTE) Calculated using the CKD-EPI Creatinine Equation (2021)    Anion gap 9 5 - 15    Comment: Performed at [facility], [address] [place], [place] [address]  Heparin  level (unfractionated)     Status: None   Collection Time: 05/29/23 12:12 AM  Result Value Ref Range   Heparin  Unfractionated 0.46 0.30 - 0.70 IU/mL    Comment: (NOTE) The clinical reportable range upper limit is being lowered to >1.10 to align with the FDA approved guidance for the current laboratory assay.  If heparin  results are below expected values, and patient dosage has  been confirmed,  suggest follow up testing of antithrombin III levels. Performed at [facility], [address] [place], [place] [address]   Urinalysis, Routine w reflex microscopic -Urine, Random     Status: Abnormal   Collection Time: 05/29/23  1:53 AM  Result Value Ref Range   Color, Urine YELLOW (A) YELLOW   APPearance CLEAR (A) CLEAR   Specific Gravity, Urine 1.017 1.005 - 1.030   pH 5.0 5.0 - 8.0   Glucose, UA NEGATIVE NEGATIVE mg/dL   Hgb urine dipstick NEGATIVE NEGATIVE   Bilirubin Urine NEGATIVE NEGATIVE   Ketones, ur NEGATIVE NEGATIVE mg/dL   Protein, ur NEGATIVE NEGATIVE mg/dL   Nitrite NEGATIVE NEGATIVE   Leukocytes,Ua NEGATIVE NEGATIVE    Comment: Performed at [facility], [address] [place], [place] [address]  Creatinine, urine, random     Status: None   Collection Time: 05/29/23  1:53 AM  Result Value Ref Range   Creatinine, Urine 125 mg/dL    Comment: Performed at [facility], [address] [place], [place] [address]  Sodium, urine, random     Status: None   Collection Time: 05/29/23  1:53 AM  Result Value Ref Range   Sodium, Ur 90 mmol/L    Comment: Performed at [facility], [address] [place], [place] [address]  ECHOCARDIOGRAM COMPLETE     Status: None   Collection Time: 05/29/23 12:51 PM  Result Value Ref Range   Weight 3,841.3 oz   Height 66 in   BP 142/84 mmHg   Ao pk vel 1.51 m/s   AV Area VTI 3.04 cm2   AR max vel 2.60 cm2   AV Mean grad 5.0 mmHg   AV Peak grad  9.1 mmHg   S' Lateral 2.10 cm   AV Area mean vel 2.59 cm2   Area-P 1/2 7.09 cm2   Est EF 60 - 65%   Heparin  level (unfractionated)     Status: Abnormal   Collection Time: 05/30/23  4:30 AM  Result Value Ref Range   Heparin  Unfractionated 0.28 (L) 0.30 - 0.70 IU/mL    Comment: (NOTE) The clinical reportable range upper limit is being lowered to >1.10 to align with the FDA approved guidance for the current laboratory assay.  If heparin  results are  below expected values, and patient dosage has  been confirmed, suggest follow up testing of antithrombin III levels. Performed at [facility], [address] [place], [place] [address]   CBC     Status: Abnormal   Collection Time: 05/30/23  4:30 AM  Result Value Ref Range   WBC 10.3 4.0 - 10.5 K/uL   RBC 4.10 (L) 4.22 - 5.81 MIL/uL   Hemoglobin 10.6 (L) 13.0 - 17.0 g/dL   HCT 60.4 (L) 54.0 - 98.1 %   MCV 80.5 80.0 - 100.0 fL   MCH 25.9 (L) 26.0 - 34.0 pg   MCHC 32.1 30.0 - 36.0 g/dL   RDW 19.1 47.8 - 29.5 %   Platelets 173 150 - 400 K/uL   nRBC 0.0 0.0 - 0.2 %    Comment: Performed at [facility], [address] [place], [place] [address]  Glucose, capillary     Status: None   Collection Time: 05/30/23  8:08 AM  Result Value Ref Range   Glucose-Capillary 98 70 - 99 mg/dL    Comment: Glucose reference range applies only to samples taken after fasting for at least 8 hours.  CBC with Diff     Status: Abnormal   Collection Time: 07/25/23 10:42 AM  Result Value Ref Range   WBC 4.9 3.4 - 10.8 x10E3/uL   RBC 4.14 4.14 - 5.80 x10E6/uL   Hemoglobin 10.5 (L) 13.0 - 17.7 g/dL   Hematocrit 86.5 (L) 78.4 - 51.0 %   MCV 81 79 - 97 fL   MCH 25.4 (L) 26.6 - 33.0 pg   MCHC 31.4 (L) 31.5 - 35.7 g/dL   RDW 69.6 29.5 - 28.4 %   Platelets 268 150 - 450 x10E3/uL   Neutrophils 50 Not Estab. %   Lymphs 38 Not Estab. %   Monocytes 9 Not Estab. %   Eos 2 Not Estab. %   Basos 1 Not Estab. %   Neutrophils Absolute 2.4 1.4 - 7.0 x10E3/uL   Lymphocytes Absolute 1.9 0.7 - 3.1 x10E3/uL   Monocytes Absolute 0.4 0.1 - 0.9 x10E3/uL   EOS (ABSOLUTE) 0.1 0.0 - 0.4 x10E3/uL   Basophils Absolute 0.1 0.0 - 0.2 x10E3/uL   Immature Granulocytes 0 Not Estab. %   Immature Grans (Abs) 0.0 0.0 - 0.1 x10E3/uL  CMP14+EGFR     Status: Abnormal   Collection Time: 07/25/23 10:42 AM  Result Value Ref Range   Glucose 116 (H) 70 - 99 mg/dL   BUN 12 8 - 27 mg/dL   Creatinine, Ser 1.32 0.76 -  1.27 mg/dL   eGFR 64 >44 WN/UUV/2.53   BUN/Creatinine Ratio 10 10 - 24   Sodium 139 134 - 144 mmol/L   Potassium 3.6 3.5 - 5.2 mmol/L   Chloride 101 96 - 106 mmol/L   CO2 24 20 - 29 mmol/L   Calcium  9.0 8.6 - 10.2 mg/dL   Total Protein 7.0  6.0 - 8.5 g/dL   Albumin 4.3 3.9 - 4.9 g/dL   Globulin, Total 2.7 1.5 - 4.5 g/dL   Bilirubin Total 0.6 0.0 - 1.2 mg/dL   Alkaline Phosphatase 93 44 - 121 IU/L   AST 14 0 - 40 IU/L   ALT 15 0 - 44 IU/L  Lipid Panel w/o Chol/HDL Ratio     Status: Abnormal   Collection Time: 07/25/23 10:42 AM  Result Value Ref Range   Cholesterol, Total 133 100 - 199 mg/dL   Triglycerides 80 0 - 149 mg/dL   HDL 39 (L) >16 mg/dL   VLDL Cholesterol Cal 16 5 - 40 mg/dL   LDL Chol Calc (NIH) 78 0 - 99 mg/dL  PSA     Status: None   Collection Time: 07/25/23 10:42 AM  Result Value Ref Range   Prostate Specific Ag, Serum 1.3 0.0 - 4.0 ng/mL    Comment: Roche ECLIA methodology. According to the American Urological Association, Serum PSA should decrease and remain at undetectable levels after radical prostatectomy. The AUA defines biochemical recurrence as an initial PSA value 0.2 ng/mL or greater followed by a subsequent confirmatory PSA value 0.2 ng/mL or greater. Values obtained with different assay methods or kits cannot be used interchangeably. Results cannot be interpreted as absolute evidence of the presence or absence of malignant disease.   Protein S activity     Status: None   Collection Time: 07/25/23 10:42 AM  Result Value Ref Range   Protein S Activity 74 63 - 140 %    Comment: Protein S activity may be falsely increased (masking an abnormal, low result) in patients receiving direct Xa inhibitor (e.g., rivaroxaban, apixaban , edoxaban) or a direct thrombin inhibitor (e.g., dabigatran) anticoagulant treatment due to assay interference by these drugs.   Factor 5 Mutation Leiden     Status: None   Collection Time: 07/25/23 10:42 AM  Result Value Ref  Range   Factor V Leiden Comment     Comment: Result: c.1601G>A (p.Arg534Gln) - Not Detected This result is not associated with an increased risk for venous thromboembolism. See Additional Clinical Information and Comments. Additional Clinical Information: Venous thromboembolism is a multifactorial disease influenced by genetic, environmental, and circumstantial risk factors. The c.1601G>A (p. Arg534Gln) variant in the F5 gene, commonly referred to as Factor V Leiden, is a genetic risk factor for venous thromboembolism. Heterozygous carriers of this variant have a 6- to 8-fold increased risk for venous thromboembolism. Individuals homozygous for this variant (ie, with a copy of the variant on each chromosome) have an approximately 80-fold increased risk for venous thromboembolism. Individuals who carry both a c.*97G>A variant in the F2 gene and Factor V Leiden have an approximately 20-fold increased risk for venous thromboembolism. Risks are likely to be even higher in more complex genotype combinations involving t he F2 c.*97G>A variant and Factor V Leiden (PMID: 10960454). Additional risk factors include but are not limited to: deficiency of protein C, protein S, or antithrombin III, age, male sex, personal or family history of deep vein thromboembolism, smoking, surgery, prolonged immobilization, malignant neoplasm, tamoxifen treatment, raloxifene treatment, oral contraceptive use, hormone replacement therapy, and pregnancy. Management of thrombotic risk and thrombotic events should follow established guidelines and fit the clinical circumstance. This result cannot predict the occurrence or recurrence of a thrombotic event. Comment: Genetic counseling is recommended to discuss the potential clinical implications of positive results, as well as recommendations for testing family members. Genetic Coordinators are available for health care providers  to discuss results at  [phone]). 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 The diagnostic sensitivity is >99%. Results must be combined with clinical information for the most accurate interpretation. Molecular-based testing is highly accurate, but as in any laboratory test, diagnostic errors may occur. False positive or false negative results may occur for reasons that include genetic variants, blood transfusions, bone marrow transplantation, somatic or tissue-specific mosaicism, mislabeled samples, or erroneous representation of family relationships. This test was developed and its performance characteristics determined by [facility]. It has not been cleared or approved by the Food and Drug Administration. References: Bhatt S, Taylor AK, Lozano R, Grody [facility], [facility]; [facility] Professional Practice and Guidelines Committee. Addendum: [employer] of [employer] Genetics consensus statement on factor V Leide n mutation testing. Genet Med. 2021 Mar 5. doi: 10.1038/s41436-021-01108-x. PMID: 11914782. Nanetta Baars. Factor V Leiden Thrombophilia. 1999 May 14 (Updated 2018 Jan 4). In: Jerri Morale, Ardinger HH, Pagon RA, et al., editors. GeneReviews(R) (Internet). [address] [facility]): [employer] , [place]; 9562-1308. Available from: [email or web address] Lynda Sands, Huang X, Luo B, Spector EB, Nicolasa Barrett CS; [facility] Laboratory Quality Assurance Committee. Venous thromboembolism laboratory testing (factor V Leiden and factor II c.*97G>A), 2018 update: a technical standard of the [employer] of [employer] and [employer] ([facility]). Genet Med. 2018 Dec;20(12):1489-1498. doi: 10.1038/[id number]-z. Epub 2018 Oct 5. PMID: 65784696.    Reviewed By: Comment     Comment: Technical  Component performed at [facility] Professional Component performed by: Horald Lyme, PhD, [facility] [id number], [facility], [address] [place] [address]   Antiphospholipid syndrome eval, bld     Status: Abnormal   Collection Time: 07/25/23 10:42 AM  Result Value Ref Range   APTT PPP 22.9 22.9 - 30.2 sec   PT 9.5 9.1 - 12.0 sec   INR 0.9 0.9 - 1.2    Comment: Reference interval is for non-anticoagulated patients. Suggested INR therapeutic range for Vitamin K antagonist therapy:    Standard Dose (moderate intensity                   therapeutic range):       2.0 - 3.0    Higher intensity therapeutic range       2.5 - 3.5    Thrombin Time 24.3 (H) 0.0 - 23.0 sec   Dilute Viper Venom Time 37.5 0.0 - 47.0 sec   Hexagonal Phase Phospholipid 3 0 - 11 sec   Anticardiolipin IgG <9 0 - 14 GPL U/mL    Comment:                           Negative:              <15                           Indeterminate:     15 - 20                           Low-Med Positive: >20 - 80                           High Positive:         >  80    Anticardiolipin IgM <9 0 - 12 MPL U/mL    Comment:                           Negative:              <13                           Indeterminate:     13 - 20                           Low-Med Positive: >20 - 80                           High Positive:         >80    Beta-2 Glyco I IgG <9 0 - 20 GPI IgG units    Comment: The reference interval reflects a 3SD or 99th percentile interval, which is thought to represent a potentially clinically significant result in accordance with the International Consensus Statement on the classification criteria for definitive antiphospholipid syndrome (APS). J Thromb Haem 2006;4:295-306.    Beta-2 Glyco 1 IgM <9 0 - 32 GPI IgM units    Comment: The reference interval reflects a 3SD or 99th percentile interval, which is thought to represent a potentially clinically significant result in accordance with the International Consensus  Statement on the classification criteria for definitive antiphospholipid syndrome (APS). J Thromb Haem 2006;4:295-306.    APS Panel Interpretation Comment     Comment: Please refer to the Coag Studies Interp Report.  Sickle cell screen     Status: None   Collection Time: 07/25/23 10:42 AM  Result Value Ref Range   Sickle Cell Screen Negative Negative    Comment: Since a variety of conditions and other abnormal hemoglobins in addition to Hemoglobin S may give false-positive results, positive Hemoglobin Solubility tests should be confirmed by hemoglobin fractionation testing.   Coag Studies Interp Report     Status: None   Collection Time: 07/25/23 10:42 AM  Result Value Ref Range   Interpretation Note     Comment: ------------------------------- COAGULATION: ANTIPHOSPHOLIPID SYNDROME ASSESSMENT ASSESSMENT A lupus anticoagulant is not detected. aCL and B2GP1 antibodies are normal. ANTIPHOSPHOLIPID SYNDROME ASSESSMENT SUMMARY - No evidence of a lupus anticoagulant, B2GP1 or aCL antibodies. As antibody titers may fluctuate with time, repeat testing may be indicated if antiphospholipid syndrome is suspected. ANTIPHOSPHOLIPID SYNDROME ASSESSMENT DEFINITIONS - aCL- anticardiolipin (antibodies to cardiolipin); B2GP1- antibodies to Beta-2 Glycoprotein 1; LA- lupus anticoagulant (which is identified with the dRVVT and/or hexagonal phospholipid neutralization assays); aPL- antibodies to protein/phospholipid complexes such as LA, aCL, and B2GP1 antibodies; APS- antiphospholipid syndrome; DTI-direct thrombin inhibitors. - For questions regarding panel interpretation, please contact [facility] at [phone]. ------------------------------- DISCLAIMER These assessments and interpreta tions are provided as a convenience in support of the physician-patient relationship and are not intended to replace the physician's clinical judgment. They are derived from national guidelines  in addition to other evidence and expert opinion. The clinician should consider this information within the context of clinical opinion and the individual patient. SEE GUIDANCE FOR ANTIPHOSPHOLIPID SYNDROME ASSESSMENT:(1) Pengo V et al. Hinton Luis Haemost. 2009; 7(10):1737-1740. (2) Miyakis S et al. Hinton Luis Haemost. 2006;4(2):295-306. (3) Robbert Childes DA et al. Blood. [id number]): [id number].       Assessment & Plan:  Continue current medications.  Referral to ophthalmology for visual blurring. Problem List Items Addressed This Visit     PVD (peripheral vascular disease) (HCC)   Relevant Medications   atorvastatin  (LIPITOR) 80 MG tablet   CAD (coronary artery disease)   Relevant Medications   atorvastatin  (LIPITOR) 80 MG tablet   HLD (hyperlipidemia) - Primary   Relevant Medications   atorvastatin  (LIPITOR) 80 MG tablet   Other Visit Diagnoses       Blurring of visual image of left eye       Relevant Orders   Ambulatory referral to Ophthalmology       Return in about 3 months (around 11/08/2023).   Total time spent: 30 minutes  Aisha Hove, MD  08/08/2023   This document may have been prepared by [facility] Voice Recognition software and as such may include unintentional dictation errors.

## 2023-08-09 LAB — COLOGUARD: COLOGUARD: NEGATIVE

## 2023-08-23 DIAGNOSIS — H04123 Dry eye syndrome of bilateral lacrimal glands: Secondary | ICD-10-CM | POA: Diagnosis not present

## 2023-08-23 DIAGNOSIS — H2513 Age-related nuclear cataract, bilateral: Secondary | ICD-10-CM | POA: Diagnosis not present

## 2023-08-23 DIAGNOSIS — Z0100 Encounter for examination of eyes and vision without abnormal findings: Secondary | ICD-10-CM | POA: Diagnosis not present

## 2023-08-24 DIAGNOSIS — H524 Presbyopia: Secondary | ICD-10-CM | POA: Diagnosis not present

## 2023-09-12 ENCOUNTER — Ambulatory Visit (INDEPENDENT_AMBULATORY_CARE_PROVIDER_SITE_OTHER): Payer: Medicare HMO | Admitting: Neurology

## 2023-09-12 VITALS — BP 126/74 | HR 72 | Ht 66.0 in | Wt 234.0 lb

## 2023-09-12 DIAGNOSIS — I6529 Occlusion and stenosis of unspecified carotid artery: Secondary | ICD-10-CM | POA: Diagnosis not present

## 2023-09-12 NOTE — Patient Instructions (Signed)
 I had a long d/w patient about his remote stroke, risk for recurrent stroke/TIAs, personally independently reviewed imaging studies and stroke evaluation results and answered questions.Continue  aspirin  81 mg daily   for secondary stroke prevention and maintain strict control of hypertension with blood pressure goal below 130/90, diabetes with hemoglobin A1c goal below 6.5% and lipids with LDL cholesterol goal below 70 mg/dL. I also advised the patient to eat a healthy diet with plenty of whole grains, cereals, fruits and vegetables, exercise regularly and maintain ideal body weight .follow-up for CPAP titration in the sleep lab.  Check screening carotid ultrasound . followup in the future with me only as needed or call earlier if needed.

## 2023-09-12 NOTE — Progress Notes (Signed)
 [facility] [address] [place]. [place] [address] [phone]       OFFICE FOLLOW-UP NOTE  Travis Williams Date of Birth:  12/24/1955 Medical Record Number:  [id number]   HPI:  Last visit 03/04/2021: [name] is a 68 year old African-American male seen today for initial office follow-up visit following hospital admission for stroke in September 2022.  History is obtained from the patient and review of electronic medical records and opossum reviewed pertinent available imaging films in PACS.Travis Williams is a 68 y.o. male who only has PMH known of HTN.  He presented to [facility] 12/03/20 with R hemiplegia and slurred speech. NIHSS 16. Last known well at 1030 and found on floor at 1230CT head was negative. 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 He was given TNKase  and then required intubation. 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 IR team, Dr. Alona, emergently consulted and patient was emergently taken to IR and underwent successful mechanical thrombectomy with rescue stent placement.  He was admitted to the ICU blood pressure was tightly controlled.  He was extubated and did well.  MRI scan showed scattered bilateral punctate cerebellar, brainstem and occipital lobe infarcts.  2D echo showed ejection fraction of 70 to 75%.  Cardiac monitoring did not show significant arrhythmias.  LDL cholesterol 136 mg percent.  Hemoglobin A1c was 5.7.  He was started on aspirin  and Brilinta .  His only mild deficits included dysarthria and right facial droop.  He was discharged home and has done well.  His speech is recovered completely back to normal and he is tolerating aspirin  and Plavix  now as he was unable to afford Brilinta .  He has minor bruising but no bleeding.  He has quit smoking completely as well as alcohol.  His blood pressure is under good control today it is 118/86 6.  He is tolerating Lipitor but does complain of some  muscle aches.  He has not had any follow-up lipid profile checked.  He is able to ambulate independently without assistance.  He feels he is neurologically back to his baseline and has no deficits remaining from his stroke.  He has no new complaints today. Update 09/02/2021 ; He returns for follow-up after last visit 6 months ago.  He states she is doing well.  She has had no recurrent stroke or TIA symptoms.  He remains on aspirin  and Plavix  which is tolerating well without bruising or bleeding.  He has gained some weight and he knows he needs to lose it off.  He had a home sleep study done on 07/19/2021 which showed severe sleep apnea.  If is following up with Dr. Chalice for the same and needs to be scheduled to have CPAP titration.  He had lab work done at last visit on 02/22/2021 and LDL cholesterol is optimal at 64 mg percent.  He is tolerating Lipitor well without muscle aches and pains.  He is also on Pletal  for peripheral vascular disease.  His blood pressure is well controlled today it is 121/73.  Update 09/12/2022 : He returns for follow-up after last visit 1 year ago.  He is doing well.  He has had no recurrent stroke or TIA symptoms.  He is tolerating aspirin  well without bruising or bleeding.  He is tolerating Lipitor well without muscle aches and pains.  He does not know when he has his last lipid profile checked.  He does use a CPAP every night without fail.  He denies  any new recurrent neurological symptoms.  He is also not had any new health problems.  He has quit smoking since after his stroke.SABRAHe had follow-up   carotid ultrasound on 09/15/2021 which showed no significant extracranial stenosis.  Transcranial Doppler study was also normal.  He has no new complaints today.  Update 09/12/2023 : He returns for follow-up after last visit with me a year ago.  He is doing well from stroke standpoint without recurrent stroke or TIA symptoms.  He was admitted on 05/30/2023 for sudden onset of chest pain for  2 days and found to have acute DVT with saddle pulmonary embolus he was treated initially with IV heparin  and vascular surgery did mechanical thrombectomy of the pulmonary embolus.  Hypercoagulable panel labs were negative.  He was switched to Eliquis  which he is tolerating well without bruising or bleeding.  States blood pressure is also under good control.  Tolerating Lipitor well without muscle aches and pains.  He states he has been quite compliant with using his CPAP every night.  He does follow-up in the sleep clinic in office regularly.  His last carotid ultrasound was in July 2024 and had shown no significant bilateral extracranial carotid stenosis.  He has no new complaints.  ROS:   14 system review of systems is positive for chest pain, shortness of breath, pulmonary embolism, DVT dysarthria, slurred speech, weakness, hemiplegia, bruising, myalgias all other systems negative  PMH:  Past Medical History:  Diagnosis Date   Hypertension    Stroke [facility])     Social History:  Social History   Socioeconomic History   Marital status: Single    Spouse name: Not on file   Number of children: 2   Years of education: Not on file   Highest education level: High school graduate  Occupational History   Not on file  Tobacco Use   Smoking status: Former    Current packs/day: 0.00    Average packs/day: 1 pack/day for 5.0 years (5.0 ttl pk-yrs)    Types: Cigarettes    Start date: 33    Quit date: 58    Years since quitting: 28.4   Smokeless tobacco: Never  Vaping Use   Vaping status: Never Used  Substance and Sexual Activity   Alcohol use: Not Currently   Drug use: Never   Sexual activity: Not on file  Other Topics Concern   Not on file  Social History Narrative   Lives w roommate friend   Right handed   Caffeine: energy drink once a week   Social Drivers of Corporate investment banker Strain: Not on file  Food Insecurity: No Food Insecurity (05/28/2023)   Hunger Vital Sign     Worried About Running Out of Food in the Last Year: Never true    Ran Out of Food in the Last Year: Never true  Transportation Needs: No Transportation Needs (05/28/2023)   PRAPARE - Administrator, Civil Service (Medical): No    Lack of Transportation (Non-Medical): No  Physical Activity: Not on file  Stress: Not on file  Social Connections: Socially Integrated (05/28/2023)   Social Connection and Isolation Panel    Frequency of Communication with Friends and Family: More than three times a week    Frequency of Social Gatherings with Friends and Family: More than three times a week    Attends Religious Services: More than 4 times per year    Active Member of Clubs or Organizations: No  Attends Banker Meetings: More than 4 times per year    Marital Status: Living with partner  Intimate Partner Violence: Not At Risk (05/28/2023)   Humiliation, Afraid, Rape, and Kick questionnaire    Fear of Current or Ex-Partner: No    Emotionally Abused: No    Physically Abused: No    Sexually Abused: No    Medications:   Current Outpatient Medications on File Prior to Visit  Medication Sig Dispense Refill   amLODipine  (NORVASC ) 5 MG tablet Take 1 tablet (5 mg total) by mouth daily. 30 tablet 11   apixaban  (ELIQUIS ) 5 MG TABS tablet 2 tabs twice a day for 7 days followed by 1 tab twice a day (Patient taking differently: Take 5 mg by mouth daily. 2 tabs twice a day for 7 days followed by 1 tab twice a day) 120 tablet 1   atorvastatin  (LIPITOR) 80 MG tablet Take 1 tablet (80 mg total) by mouth daily. 90 tablet 3   labetalol  (NORMODYNE ) 300 MG tablet Take 1 tablet (300 mg total) by mouth 2 (two) times daily. 180 tablet 1   losartan -hydrochlorothiazide  (HYZAAR) 100-25 MG tablet Take 1 tablet by mouth daily. 30 tablet 11   sildenafil  (VIAGRA ) 50 MG tablet Take 0.5 tablets (25 mg total) by mouth as needed for erectile dysfunction. 5 tablet 0   Current Facility-Administered  Medications on File Prior to Visit  Medication Dose Route Frequency Provider Last Rate Last Admin   sodium chloride  flush (NS) 0.9 % injection 3 mL  3 mL Intravenous Q12H Fernand Denyse LABOR, MD        Allergies:  No Known Allergies  Physical Exam General: Mildly obese middle-aged African-American male, seated, in no evident distress Head: head normocephalic and atraumatic.  Neck: supple with no carotid or supraclavicular bruits Cardiovascular: regular rate and rhythm, no murmurs Musculoskeletal: no deformity Skin:  no rash/petichiae Vascular:  Normal pulses all extremities Vitals:   09/12/23 1250  BP: 126/74  Pulse: 72   Neurologic Exam Mental Status: Awake and fully alert. Oriented to place and time. Recent and remote memory intact. Attention span, concentration and fund of knowledge appropriate. Mood and affect appropriate.  . Cranial Nerves: Fundoscopic exam not done. Pupils equal, briskly reactive to light. Extraocular movements full without nystagmus. Visual fields full to confrontation. Hearing intact. Facial sensation intact. Face, tongue, palate moves normally and symmetrically.  Motor: Normal bulk and tone. Normal strength in all tested extremity muscles. Sensory.: intact to touch ,pinprick .position and vibratory sensation.  Coordination: Rapid alternating movements normal in all extremities. Finger-to-nose and heel-to-shin performed accurately bilaterally. Gait and Station: Arises from chair without difficulty. Stance is normal. Gait demonstrates normal stride length and balance . Able to heel, toe and tandem walk with only mild difficulty.  Reflexes: 1+ and symmetric. Toes downgoing.   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  Vascular risk  factors of hypertension, hyperlipidemia, smoking and intracranial stenosis.  Patient has done amazingly well with practically no residual deficits or symptoms.  New diagnosis of DVT and saddle pulmonary embolism in March 2025 treated with mechanical thrombectomy and now on anticoagulation with Eliquis      PLAN: I had a long d/w patient about his remote stroke, risk for recurrent stroke/TIAs, personally independently reviewed imaging studies and stroke evaluation results and  answered questions.Continue  a Eliquis  daily   for secondary stroke prevention  given recent pulmonary embolism and maintain strict control of hypertension with blood pressure goal below 130/90, diabetes with hemoglobin A1c goal below 6.5% and lipids with LDL cholesterol goal below 70 mg/dL. I also advised the patient to eat a healthy diet with plenty of whole grains, cereals, fruits and vegetables, exercise regularly and maintain ideal body weight .follow-up for CPAP titration in the sleep lab.  Check screening carotid ultrasound . followup in the future with me only as needed or call earlier if needed. Greater than 50% of time during this 35 minute visit was spent on counseling,explanation of diagnosis, planning of further management, discussion with patient and family and coordination of care Eather Popp, MD Note: This document was prepared with digital dictation and possible smart phrase technology. Any transcriptional errors that result from this process are unintentional

## 2023-10-05 ENCOUNTER — Ambulatory Visit (HOSPITAL_COMMUNITY)

## 2023-10-10 ENCOUNTER — Encounter: Payer: Self-pay | Admitting: Cardiovascular Disease

## 2023-10-10 ENCOUNTER — Ambulatory Visit: Admitting: Cardiovascular Disease

## 2023-10-10 VITALS — BP 122/77 | HR 78 | Ht 66.0 in | Wt 234.2 lb

## 2023-10-10 DIAGNOSIS — I10 Essential (primary) hypertension: Secondary | ICD-10-CM | POA: Diagnosis not present

## 2023-10-10 DIAGNOSIS — E782 Mixed hyperlipidemia: Secondary | ICD-10-CM | POA: Diagnosis not present

## 2023-10-10 DIAGNOSIS — I2602 Saddle embolus of pulmonary artery with acute cor pulmonale: Secondary | ICD-10-CM

## 2023-10-10 DIAGNOSIS — R0602 Shortness of breath: Secondary | ICD-10-CM

## 2023-10-10 DIAGNOSIS — G4730 Sleep apnea, unspecified: Secondary | ICD-10-CM

## 2023-10-10 DIAGNOSIS — I25110 Atherosclerotic heart disease of native coronary artery with unstable angina pectoris: Secondary | ICD-10-CM

## 2023-10-10 NOTE — Progress Notes (Signed)
 Cardiology Office Note   Date:  10/10/2023   ID:  Travis, Williams [birth year], MRN [id number]  PCP:  Travis Fredy RAMAN, MD  Cardiologist:  Travis Fernand, MD      History of Present Illness: Travis Williams is a 68 y.o. male who presents for  Chief Complaint  Patient presents with   Follow-up    Feeling ok.      Past Medical History:  Diagnosis Date   Hypertension    Stroke [facility])      Past Surgical History:  Procedure Laterality Date   CORONARY STENT INTERVENTION N/A 02/19/2021   Procedure: CORONARY STENT INTERVENTION;  Surgeon: Travis Cara BIRCH, MD;  Location: [facility] INVASIVE CV LAB;  Service: Cardiovascular;  Laterality: N/A;   IR CT HEAD LTD  12/03/2020   IR INTRA CRAN STENT  12/03/2020   IR PERCUTANEOUS ART THROMBECTOMY/INFUSION INTRACRANIAL INC DIAG ANGIO  12/03/2020   IR RADIOLOGIST EVAL & MGMT  02/23/2021   IR US  GUIDE VASC ACCESS RIGHT  12/03/2020   LEFT HEART CATH AND CORONARY ANGIOGRAPHY N/A 02/19/2021   Procedure: LEFT HEART CATH AND CORONARY ANGIOGRAPHY with intervention;  Surgeon: Travis Travis LABOR, MD;  Location: [facility] INVASIVE CV LAB;  Service: Cardiovascular;  Laterality: N/A;   PULMONARY THROMBECTOMY Bilateral 05/29/2023   Procedure: PULMONARY THROMBECTOMY;  Surgeon: Travis Selinda RAMAN, MD;  Location: [facility] INVASIVE CV LAB;  Service: Cardiovascular;  Laterality: Bilateral;   RADIOLOGY WITH ANESTHESIA N/A 12/03/2020   Procedure: IR WITH ANESTHESIA;  Surgeon: Radiologist, Medication, MD;  Location: [facility] OR;  Service: Radiology;  Laterality: N/A;     Current Outpatient Medications  Medication Sig Dispense Refill   amLODipine  (NORVASC ) 5 MG tablet Take 1 tablet (5 mg total) by mouth daily. 30 tablet 11   apixaban  (ELIQUIS ) 5 MG TABS tablet 2 tabs twice a day for 7 days followed by 1 tab twice a day 120 tablet 1   atorvastatin  (LIPITOR) 80 MG tablet Take 1 tablet (80 mg total) by mouth daily. 90 tablet 3   labetalol  (NORMODYNE ) 300 MG tablet Take 1 tablet (300 mg total) by  mouth 2 (two) times daily. 180 tablet 1   losartan -hydrochlorothiazide  (HYZAAR) 100-25 MG tablet Take 1 tablet by mouth daily. 30 tablet 11   sildenafil  (VIAGRA ) 50 MG tablet Take 0.5 tablets (25 mg total) by mouth as needed for erectile dysfunction. 5 tablet 0   No current facility-administered medications for this visit.   Facility-Administered Medications Ordered in Other Visits  Medication Dose Route Frequency Provider Last Rate Last Admin   sodium chloride  flush (NS) 0.9 % injection 3 mL  3 mL Intravenous Q12H Travis Travis LABOR, MD        Allergies:   Patient has no known allergies.    Social History:   reports that he quit smoking about 28 years ago. His smoking use included cigarettes. He started smoking about 33 years ago. He has a 5 pack-year smoking history. He has never used smokeless tobacco. He reports that he does not currently use alcohol. He reports that he does not use drugs.   Family History:  family history includes Hypertension in his brother.    ROS:     Review of Systems  Constitutional: Negative.   HENT: Negative.    Eyes: Negative.   Respiratory: Negative.    Gastrointestinal: Negative.   Genitourinary: Negative.   Musculoskeletal: Negative.   Skin: Negative.   Neurological: Negative.   Endo/Heme/Allergies: Negative.   Psychiatric/Behavioral: Negative.  All other systems reviewed and are negative.     All other systems are reviewed and negative.    PHYSICAL EXAM: VS:  BP 122/77   Pulse 78   Ht 5' 6 (1.676 m)   Wt 234 lb 3.2 oz (106.2 kg)   SpO2 96%   BMI 37.80 kg/m  , BMI Body mass index is 37.8 kg/m. Last weight:  Wt Readings from Last 3 Encounters:  10/10/23 234 lb 3.2 oz (106.2 kg)  09/12/23 234 lb (106.1 kg)  08/08/23 232 lb (105.2 kg)     Physical Exam Vitals reviewed.  Constitutional:      Appearance: Normal appearance. He is normal weight.  HENT:     Head: Normocephalic.     Nose: Nose normal.     Mouth/Throat:     Mouth:  Mucous membranes are moist.  Eyes:     Pupils: Pupils are equal, round, and reactive to light.  Cardiovascular:     Rate and Rhythm: Normal rate and regular rhythm.     Pulses: Normal pulses.     Heart sounds: Normal heart sounds.  Pulmonary:     Effort: Pulmonary effort is normal.  Abdominal:     General: Abdomen is flat. Bowel sounds are normal.  Musculoskeletal:        General: Normal range of motion.     Cervical back: Normal range of motion.  Skin:    General: Skin is warm.  Neurological:     General: No focal deficit present.     Mental Status: He is alert.  Psychiatric:        Mood and Affect: Mood normal.       EKG:   Recent Labs: 05/28/2023: B Natriuretic Peptide 52.5 07/25/2023: ALT 15; BUN 12; Creatinine, Ser 1.22; Hemoglobin 10.5; Platelets 268; Potassium 3.6; Sodium 139    Lipid Panel    Component Value Date/Time   CHOL 133 07/25/2023 1042   TRIG 80 07/25/2023 1042   HDL 39 (L) 07/25/2023 1042   CHOLHDL 3.3 09/12/2022 1341   CHOLHDL 5.1 12/06/2020 0317   VLDL 24 12/06/2020 0317   LDLCALC 78 07/25/2023 1042   LDLDIRECT UNABLE TO CALCULATE IF TRIGLYCERIDE IS >1293 mg/dL 90/83/7977 9483      Other studies Reviewed: Additional studies/ records that were reviewed today include:  Review of the above records demonstrates:       No data to display            ASSESSMENT AND PLAN:    ICD-10-CM   1. SOB (shortness of breath)  R06.02 MYOCARDIAL PERFUSION IMAGING   Has DOE, will do stress tesst as has CAD.    2. Coronary artery disease involving native coronary artery of native heart with unstable angina pectoris (HCC)  I25.110 MYOCARDIAL PERFUSION IMAGING   No chest pains. Advise stress test as not done in few years.    3. Acute saddle pulmonary embolism with acute cor pulmonale (HCC)  I26.02 MYOCARDIAL PERFUSION IMAGING    4. Primary hypertension  I10 MYOCARDIAL PERFUSION IMAGING    5. Severe sleep apnea  G47.30 MYOCARDIAL PERFUSION IMAGING     6. Mixed hyperlipidemia  E78.2 MYOCARDIAL PERFUSION IMAGING       Problem List Items Addressed This Visit       Cardiovascular and Mediastinum   Primary hypertension   Relevant Orders   MYOCARDIAL PERFUSION IMAGING   CAD (coronary artery disease)   Relevant Orders   MYOCARDIAL PERFUSION IMAGING   Acute pulmonary  embolism (HCC)   Relevant Orders   MYOCARDIAL PERFUSION IMAGING     Respiratory   Severe sleep apnea   Relevant Orders   MYOCARDIAL PERFUSION IMAGING     Other   HLD (hyperlipidemia)   Relevant Orders   MYOCARDIAL PERFUSION IMAGING   Other Visit Diagnoses       SOB (shortness of breath)    -  Primary   Has DOE, will do stress tesst as has CAD.   Relevant Orders   MYOCARDIAL PERFUSION IMAGING          Disposition:   Return in about 4 weeks (around 11/07/2023) for stress test and f/u.    Total time spent: 30 minutes  Signed,  Travis Bathe, MD  10/10/2023 9:49 AM    [facility]

## 2023-10-21 ENCOUNTER — Other Ambulatory Visit: Payer: Self-pay | Admitting: Obstetrics and Gynecology

## 2023-10-24 ENCOUNTER — Ambulatory Visit (HOSPITAL_COMMUNITY)

## 2023-10-30 ENCOUNTER — Ambulatory Visit

## 2023-10-30 DIAGNOSIS — I25110 Atherosclerotic heart disease of native coronary artery with unstable angina pectoris: Secondary | ICD-10-CM

## 2023-10-30 DIAGNOSIS — I10 Essential (primary) hypertension: Secondary | ICD-10-CM

## 2023-10-30 DIAGNOSIS — R0602 Shortness of breath: Secondary | ICD-10-CM

## 2023-10-30 DIAGNOSIS — E782 Mixed hyperlipidemia: Secondary | ICD-10-CM

## 2023-10-30 DIAGNOSIS — G4730 Sleep apnea, unspecified: Secondary | ICD-10-CM

## 2023-10-30 DIAGNOSIS — I2602 Saddle embolus of pulmonary artery with acute cor pulmonale: Secondary | ICD-10-CM

## 2023-11-09 ENCOUNTER — Encounter: Payer: Self-pay | Admitting: Internal Medicine

## 2023-11-09 ENCOUNTER — Ambulatory Visit (INDEPENDENT_AMBULATORY_CARE_PROVIDER_SITE_OTHER): Admitting: Internal Medicine

## 2023-11-09 VITALS — BP 118/68 | HR 90 | Ht 66.0 in | Wt 236.8 lb

## 2023-11-09 DIAGNOSIS — E782 Mixed hyperlipidemia: Secondary | ICD-10-CM

## 2023-11-09 DIAGNOSIS — H538 Other visual disturbances: Secondary | ICD-10-CM

## 2023-11-09 DIAGNOSIS — I639 Cerebral infarction, unspecified: Secondary | ICD-10-CM

## 2023-11-09 DIAGNOSIS — R7301 Impaired fasting glucose: Secondary | ICD-10-CM | POA: Diagnosis not present

## 2023-11-09 DIAGNOSIS — I10 Essential (primary) hypertension: Secondary | ICD-10-CM | POA: Diagnosis not present

## 2023-11-09 NOTE — Progress Notes (Signed)
 Established Patient Office Visit  Subjective:  Patient ID: Travis Williams, male    DOB: Mar 07, 1956  Age: 68 y.o. MRN: [id number]  Chief Complaint  Patient presents with   Follow-up    3 month follow up    Patient seen today for his 3 month follow up. He reports he saw the Ophthalmologist and he increased his glasses prescription strength but otherwise had no abnormal findings related to his left eye intermittently becoming blurry and being unable to see. He reports it happens frequently and he has stopped driving due to the concern. We discussed his history and since he has a history of stroke and blood clots, I recommended a CT of the brain to determine if the cause of his blurred vision. Otherwise he reports he is feeling well and denies any symptoms of chest pain, shortness of breath, headaches, nausea, vomiting, or diarrhea.    No other concerns at this time.   Past Medical History:  Diagnosis Date   Hypertension    Stroke [facility])     Past Surgical History:  Procedure Laterality Date   CORONARY STENT INTERVENTION N/A 02/19/2021   Procedure: CORONARY STENT INTERVENTION;  Surgeon: Florencio Cara BIRCH, MD;  Location: [facility] INV[facility]IVE CV LAB;  Service: Cardiovascular;  Laterality: N/A;   IR CT HEAD LTD  12/03/2020   IR INTRA CRAN STENT  12/03/2020   IR PERCUTANEOUS ART THROMBECTOMY/INFUSION INTRACRANIAL INC DIAG ANGIO  12/03/2020   IR RADIOLOGIST EVAL & MGMT  02/23/2021   IR US  GUIDE VASC ACCESS RIGHT  12/03/2020   LEFT HEART CATH AND CORONARY ANGIOGRAPHY N/A 02/19/2021   Procedure: LEFT HEART CATH AND CORONARY ANGIOGRAPHY with intervention;  Surgeon: Fernand Denyse LABOR, MD;  Location: [facility] INV[facility] CV [facility];  Service: Cardiovascular;  Laterality: N/A;   PULMONARY THROMBECTOMY Bilateral 05/29/2023   Procedure: PULMONARY THROMBECTOMY;  Surgeon: Marea Selinda RAMAN, MD;  Location: [facility] IN[facility];  Service: Cardiovascular;  Laterality: Bilateral;   RADIOLOGY WITH ANESTHESIA N/A 12/03/2020   Procedure:  IR WITH ANESTHESIA;  Surgeon: Radiologist, Medication, MD;  Location: [facility] OR;  Service: Radiology;  Laterality: N/A;    Social History   Socioeconomic History   Marital status: Single    Spouse name: Not on file   Number of children: 2   Years of education: Not on file   Highest education level: High school graduate  Occupational History   Not on file  Tobacco Use   Smoking status: Former    Current packs/day: 0.00    Average packs/day: 1 pack/day for 5.0 years (5.0 ttl pk-yrs)    Types: Cigarettes    Start date: 32    Quit date: 30    Years since quitting: 28.6   Smokeless tobacco: Never  Vaping Use   Vaping status: Never Used  Substance and Sexual Activity   Alcohol use: Not Currently   Drug use: Never   Sexual activity: Not on file  Other Topics Concern   Not on file  Social History Narrative   Lives w roommate friend   Right handed   Caffeine: energy drink once a week   Social Drivers of Corporate investment banker Strain: Not on file  Food Insecurity: No Food Insecurity (05/28/2023)   Hunger Vital Sign    Worried About Running Out of Food in the Last Year: Never true    Ran Out of Food in the Last Year: Never true  Transportation Needs: No Transportation Needs (05/28/2023)   PRAPARE - Transportation  Lack of Transportation (Medical): No    Lack of Transportation (Non-Medical): No  Physical Activity: Not on file  Stress: Not on file  Social Connections: Socially Integrated (05/28/2023)   Social Connection and Isolation Panel    Frequency of Communication with Friends and Family: More than three times a week    Frequency of Social Gatherings with Friends and Family: More than three times a week    Attends Religious Services: More than 4 times per year    Active Member of [employer] or Organizations: No    Attends Engineer, structural: More than 4 times per year    Marital Status: Living with partner  Intimate Partner Violence: Not At Risk (05/28/2023)    Humiliation, Afraid, Rape, and Kick questionnaire    Fear of Current or Ex-Partner: No    Emotionally Abused: No    Physically Abused: No    Sexually Abused: No    Family History  Problem Relation Age of Onset   Hypertension Brother     No Known Allergies  Outpatient Medications Prior to Visit  Medication Sig   amLODipine  (NORVASC ) 5 MG tablet Take 1 tablet (5 mg total) by mouth daily.   apixaban  (ELIQUIS ) 5 MG TABS tablet 2 tabs twice a day for 7 days followed by 1 tab twice a day   atorvastatin  (LIPITOR) 80 MG tablet Take 1 tablet (80 mg total) by mouth daily.   labetalol  (NORMODYNE ) 300 MG tablet Take 1 tablet (300 mg total) by mouth 2 (two) times daily.   losartan -hydrochlorothiazide  (HYZAAR) 100-25 MG tablet Take 1 tablet by mouth daily.   sildenafil  (VIAGRA ) 50 MG tablet Take 0.5 tablets (25 mg total) by mouth as needed for erectile dysfunction.   Facility-Administered Medications Prior to Visit  Medication Dose Route Frequency Provider   sodium chloride  flush (NS) 0.9 % injection 3 mL  3 mL Intravenous Q12H Fernand Denyse LABOR, MD    Review of Systems  Constitutional: Negative.   HENT: Negative.    Eyes:  Positive for blurred vision (L. eye).  Respiratory: Negative.  Negative for cough and shortness of breath.   Cardiovascular: Negative.  Negative for chest pain, palpitations and leg swelling.  Gastrointestinal: Negative.  Negative for abdominal pain, constipation, diarrhea, heartburn, nausea and vomiting.  Genitourinary: Negative.  Negative for dysuria and flank pain.  Musculoskeletal: Negative.  Negative for joint pain and myalgias.  Skin: Negative.   Neurological: Negative.  Negative for dizziness and headaches.  Endo/Heme/Allergies: Negative.   Psychiatric/Behavioral: Negative.  Negative for depression and suicidal ideas. The patient is not nervous/anxious.        Objective:   BP 118/68   Pulse 90   Ht 5' 6 (1.676 m)   Wt 236 lb 12.8 oz (107.4 kg)   SpO2 97%    BMI 38.22 kg/m   Vitals:   11/09/23 1133  BP: 118/68  Pulse: 90  Height: 5' 6 (1.676 m)  Weight: 236 lb 12.8 oz (107.4 kg)  SpO2: 97%  BMI (Calculated): 38.24    Physical Exam Vitals and nursing note reviewed.  Constitutional:      Appearance: Normal appearance.  HENT:     Head: Normocephalic and atraumatic.     Nose: Nose normal.     Mouth/Throat:     Mouth: Mucous membranes are moist.     Pharynx: Oropharynx is clear.  Eyes:     Conjunctiva/sclera: Conjunctivae normal.     Pupils: Pupils are equal, round, and reactive to light.  Cardiovascular:     Rate and Rhythm: Normal rate and regular rhythm.     Pulses: Normal pulses.     Heart sounds: Normal heart sounds.  Pulmonary:     Effort: Pulmonary effort is normal.     Breath sounds: Normal breath sounds.  Abdominal:     General: Bowel sounds are normal.     Palpations: Abdomen is soft.  Musculoskeletal:        General: Normal range of motion.     Cervical back: Normal range of motion.  Skin:    General: Skin is warm and dry.  Neurological:     General: No focal deficit present.     Mental Status: He is alert and oriented to person, place, and time.  Psychiatric:        Mood and Affect: Mood normal.        Behavior: Behavior normal.        Judgment: Judgment normal.      No results found for any visits on 11/09/23.  No results found for this or any previous visit (from the past 2160 hours).    Assessment & Plan:  Continue taking medications as prescribed. Ordered routine labs today. Will update patient with results. Placed an order for CT of the brain with contrast to determine cause of L. Eye blurred vision loss. Follow with ophthalmologist as recommended. 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

## 2023-11-10 ENCOUNTER — Ambulatory Visit: Payer: Self-pay | Admitting: Internal Medicine

## 2023-11-10 DIAGNOSIS — E876 Hypokalemia: Secondary | ICD-10-CM

## 2023-11-10 LAB — CMP14+EGFR
ALT: 17 IU/L (ref 0–44)
AST: 17 IU/L (ref 0–40)
Albumin: 4.3 g/dL (ref 3.9–4.9)
Alkaline Phosphatase: 87 IU/L (ref 44–121)
BUN/Creatinine Ratio: 13 (ref 10–24)
BUN: 19 mg/dL (ref 8–27)
Bilirubin Total: 0.7 mg/dL (ref 0.0–1.2)
CO2: 24 mmol/L (ref 20–29)
Calcium: 9.2 mg/dL (ref 8.6–10.2)
Chloride: 98 mmol/L (ref 96–106)
Creatinine, Ser: 1.51 mg/dL — ABNORMAL HIGH (ref 0.76–1.27)
Globulin, Total: 2.6 g/dL (ref 1.5–4.5)
Glucose: 121 mg/dL — ABNORMAL HIGH (ref 70–99)
Potassium: 3.3 mmol/L — ABNORMAL LOW (ref 3.5–5.2)
Sodium: 137 mmol/L (ref 134–144)
Total Protein: 6.9 g/dL (ref 6.0–8.5)
eGFR: 50 mL/min/1.73 — ABNORMAL LOW (ref >59)

## 2023-11-10 LAB — CBC WITH DIFFERENTIAL/PLATELET
Basophils Absolute: 0.1 x10E3/uL (ref 0.0–0.2)
Basos: 1 %
EOS (ABSOLUTE): 0.1 x10E3/uL (ref 0.0–0.4)
Eos: 2 %
Hematocrit: 36.2 % — ABNORMAL LOW (ref 37.5–51.0)
Hemoglobin: 10.8 g/dL — ABNORMAL LOW (ref 13.0–17.7)
Immature Grans (Abs): 0 x10E3/uL (ref 0.0–0.1)
Immature Granulocytes: 0 %
Lymphocytes Absolute: 2.4 x10E3/uL (ref 0.7–3.1)
Lymphs: 39 %
MCH: 25.5 pg — ABNORMAL LOW (ref 26.6–33.0)
MCHC: 29.8 g/dL — ABNORMAL LOW (ref 31.5–35.7)
MCV: 86 fL (ref 79–97)
Monocytes Absolute: 0.6 x10E3/uL (ref 0.1–0.9)
Monocytes: 10 %
Neutrophils Absolute: 2.9 x10E3/uL (ref 1.4–7.0)
Neutrophils: 48 %
Platelets: 286 x10E3/uL (ref 150–450)
RBC: 4.23 x10E6/uL (ref 4.14–5.80)
RDW: 15 % (ref 11.6–15.4)
WBC: 6.1 x10E3/uL (ref 3.4–10.8)

## 2023-11-10 LAB — LIPID PANEL W/O CHOL/HDL RATIO
Cholesterol, Total: 159 mg/dL (ref 100–199)
HDL: 43 mg/dL (ref >39)
LDL Chol Calc (NIH): 98 mg/dL (ref 0–99)
Triglycerides: 94 mg/dL (ref 0–149)
VLDL Cholesterol Cal: 18 mg/dL (ref 5–40)

## 2023-11-10 LAB — HEMOGLOBIN A1C
Est. average glucose Bld gHb Est-mCnc: 120 mg/dL
Hgb A1c MFr Bld: 5.8 % — ABNORMAL HIGH (ref 4.8–5.6)

## 2023-11-10 LAB — SEDIMENTATION RATE: Sed Rate: 16 mm/h (ref 0–30)

## 2023-11-10 MED ORDER — POTASSIUM CHLORIDE CRYS ER 10 MEQ PO TBCR: EXTENDED_RELEASE_TABLET | ORAL | 3 refills | Status: DC

## 2023-11-10 NOTE — Progress Notes (Signed)
 Patient notified

## 2023-11-16 ENCOUNTER — Ambulatory Visit: Admitting: Cardiovascular Disease

## 2023-11-16 ENCOUNTER — Encounter: Payer: Self-pay | Admitting: Cardiovascular Disease

## 2023-11-16 VITALS — BP 102/60 | HR 67 | Ht 66.0 in | Wt 236.0 lb

## 2023-11-16 DIAGNOSIS — G4730 Sleep apnea, unspecified: Secondary | ICD-10-CM

## 2023-11-16 DIAGNOSIS — R0602 Shortness of breath: Secondary | ICD-10-CM | POA: Diagnosis not present

## 2023-11-16 DIAGNOSIS — I10 Essential (primary) hypertension: Secondary | ICD-10-CM

## 2023-11-16 DIAGNOSIS — E782 Mixed hyperlipidemia: Secondary | ICD-10-CM

## 2023-11-16 DIAGNOSIS — I739 Peripheral vascular disease, unspecified: Secondary | ICD-10-CM

## 2023-11-16 DIAGNOSIS — I25110 Atherosclerotic heart disease of native coronary artery with unstable angina pectoris: Secondary | ICD-10-CM

## 2023-11-16 MED ORDER — APIXABAN 5 MG PO TABS: ORAL_TABLET | ORAL | 1 refills | Status: DC

## 2023-11-16 NOTE — Progress Notes (Signed)
 Cardiology Office Note   Date:  11/16/2023   ID:  Travis, Williams January 04, 1956, MRN [id number]  PCP:  Travis Fredy RAMAN, MD  Cardiologist:  Travis Fernand, MD      History of Present Illness: Travis Williams is a 68 y.o. male who presents for  Chief Complaint  Patient presents with   Results    4 week nst results     No chest pains or SOB.      Past Medical History:  Diagnosis Date   Hypertension    Stroke [facility])      Past Surgical History:  Procedure Laterality Date   CORONARY STENT INTERVENTION N/A 02/19/2021   Procedure: CORONARY STENT INTERVENTION;  Surgeon: Travis Cara BIRCH, MD;  Location: [facility];  Service: Cardiovascular;  Laterality: N/A;   IR CT HEAD LTD  12/03/2020   IR INTRA CRAN STENT  12/03/2020   IR PERCUTANEOUS ART THROMBECTOMY/INFUSION INTRACRANIAL INC DIAG ANGIO  12/03/2020   IR RADIOLOGIST EVAL & MGMT  02/23/2021   IR US  GUIDE VASC ACCESS RIGHT  12/03/2020   LEFT HEART CATH AND CORONARY ANGIOGRAPHY N/A 02/19/2021   Procedure: LEFT HEART CATH AND CORONARY ANGIOGRAPHY with intervention;  Surgeon: Travis Travis LABOR, MD;  Location: [facility];  Service: Cardiovascular;  Laterality: N/A;   PULMONARY THROMBECTOMY Bilateral 05/29/2023   Procedure: PULMONARY THROMBECTOMY;  Surgeon: Travis Selinda RAMAN, MD;  Location: [facility];  Service: Cardiovascular;  Laterality: Bilateral;   RADIOLOGY WITH ANESTHESIA N/A 12/03/2020   Procedure: IR WITH ANESTHESIA;  Surgeon: Radiologist, Medication, MD;  Location: [facility] OR;  Service: Radiology;  Laterality: N/A;     Current Outpatient Medications  Medication Sig Dispense Refill   amLODipine  (NORVASC ) 5 MG tablet Take 1 tablet (5 mg total) by mouth daily. 30 tablet 11   atorvastatin  (LIPITOR) 80 MG tablet Take 1 tablet (80 mg total) by mouth daily. 90 tablet 3   labetalol  (NORMODYNE ) 300 MG tablet Take 1 tablet (300 mg total) by mouth 2 (two) times daily. 180 tablet 1   losartan -hydrochlorothiazide  (HYZAAR)  100-25 MG tablet Take 1 tablet by mouth daily. 30 tablet 11   potassium chloride  (KLOR-CON  M) 10 MEQ tablet Take 2 tablets twice a day for 3 days. Then take 1 tablet a day on Monday, Wednesday, Fridays 30 tablet 3   sildenafil  (VIAGRA ) 50 MG tablet Take 0.5 tablets (25 mg total) by mouth as needed for erectile dysfunction. 5 tablet 0   apixaban  (ELIQUIS ) 5 MG TABS tablet 2 tabs twice a day for 7 days followed by 1 tab twice a day 120 tablet 1   No current facility-administered medications for this visit.   Facility-Administered Medications Ordered in Other Visits  Medication Dose Route Frequency Provider Last Rate Last Admin   sodium chloride  flush (NS) 0.9 % injection 3 mL  3 mL Intravenous Q12H Travis Travis LABOR, MD        Allergies:   Patient has no known allergies.    Social History:   reports that he quit smoking about 28 years ago. His smoking use included cigarettes. He started smoking about 33 years ago. He has a 5 pack-year smoking history. He has never used smokeless tobacco. He reports that he does not currently use alcohol. He reports that he does not use drugs.   Family History:  family history includes Hypertension in his brother.    ROS:     Review of Systems  Constitutional: Negative.  HENT: Negative.    Eyes: Negative.   Respiratory: Negative.    Gastrointestinal: Negative.   Genitourinary: Negative.   Musculoskeletal: Negative.   Skin: Negative.   Neurological: Negative.   Endo/Heme/Allergies: Negative.   Psychiatric/Behavioral: Negative.    All other systems reviewed and are negative.     All other systems are reviewed and negative.    PHYSICAL EXAM: VS:  BP 102/60   Pulse 67   Ht 5' 6 (1.676 m)   Wt 236 lb (107 kg)   SpO2 96%   BMI 38.09 kg/m  , BMI Body mass index is 38.09 kg/m. Last weight:  Wt Readings from Last 3 Encounters:  11/16/23 236 lb (107 kg)  11/09/23 236 lb 12.8 oz (107.4 kg)  10/10/23 234 lb 3.2 oz (106.2 kg)     Physical  Exam Vitals reviewed.  Constitutional:      Appearance: Normal appearance. He is normal weight.  HENT:     Head: Normocephalic.     Nose: Nose normal.     Mouth/Throat:     Mouth: Mucous membranes are moist.  Eyes:     Pupils: Pupils are equal, round, and reactive to light.  Cardiovascular:     Rate and Rhythm: Normal rate and regular rhythm.     Pulses: Normal pulses.     Heart sounds: Normal heart sounds.  Pulmonary:     Effort: Pulmonary effort is normal.  Abdominal:     General: Abdomen is flat. Bowel sounds are normal.  Musculoskeletal:        General: Normal range of motion.     Cervical back: Normal range of motion.  Skin:    General: Skin is warm.  Neurological:     General: No focal deficit present.     Mental Status: He is alert.  Psychiatric:        Mood and Affect: Mood normal.       EKG:   Recent Labs: 05/28/2023: B Natriuretic Peptide 52.5 11/09/2023: ALT 17; BUN 19; Creatinine, Ser 1.51; Hemoglobin 10.8; Platelets 286; Potassium 3.3; Sodium 137    Lipid Panel    Component Value Date/Time   CHOL 159 11/09/2023 1215   TRIG 94 11/09/2023 1215   HDL 43 11/09/2023 1215   CHOLHDL 3.3 09/12/2022 1341   CHOLHDL 5.1 12/06/2020 0317   VLDL 24 12/06/2020 0317   LDLCALC 98 11/09/2023 1215   LDLDIRECT UNABLE TO CALCULATE IF TRIGLYCERIDE IS >1293 mg/dL 90/83/7977 9483      Other studies Reviewed: Additional studies/ records that were reviewed today include:  Review of the above records demonstrates:       No data to display            ASSESSMENT AND PLAN:    ICD-10-CM   1. Coronary artery disease involving native coronary artery of native heart with unstable angina pectoris (HCC)  I25.110 apixaban  (ELIQUIS ) 5 MG TABS tablet   Stress test showed no ischaemia, with normal LVEF.    2. Mixed hyperlipidemia  E78.2 apixaban  (ELIQUIS ) 5 MG TABS tablet    3. Primary hypertension  I10 apixaban  (ELIQUIS ) 5 MG TABS tablet    4. SOB (shortness of breath)   R06.02 apixaban  (ELIQUIS ) 5 MG TABS tablet   No SOB as wearing CPAP    5. Severe sleep apnea  G47.30 apixaban  (ELIQUIS ) 5 MG TABS tablet    6. PVD (peripheral vascular disease) (HCC)  I73.9 apixaban  (ELIQUIS ) 5 MG TABS tablet  Problem List Items Addressed This Visit       Cardiovascular and Mediastinum   Primary hypertension   Relevant Medications   apixaban  (ELIQUIS ) 5 MG TABS tablet   PVD (peripheral vascular disease) (HCC)   Relevant Medications   apixaban  (ELIQUIS ) 5 MG TABS tablet   CAD (coronary artery disease) - Primary   Relevant Medications   apixaban  (ELIQUIS ) 5 MG TABS tablet     Respiratory   Severe sleep apnea   Relevant Medications   apixaban  (ELIQUIS ) 5 MG TABS tablet     Other   HLD (hyperlipidemia)   Relevant Medications   apixaban  (ELIQUIS ) 5 MG TABS tablet   Other Visit Diagnoses       SOB (shortness of breath)       No SOB as wearing CPAP   Relevant Medications   apixaban  (ELIQUIS ) 5 MG TABS tablet          Disposition:   Return in about 3 months (around 02/16/2024).    Total time spent: 30 minutes  Signed,  Travis Bathe, MD  11/16/2023 10:27 AM    [facility]

## 2023-11-17 ENCOUNTER — Encounter: Payer: Self-pay | Admitting: Internal Medicine

## 2023-11-17 ENCOUNTER — Ambulatory Visit (INDEPENDENT_AMBULATORY_CARE_PROVIDER_SITE_OTHER): Admitting: Internal Medicine

## 2023-11-17 VITALS — BP 114/72 | HR 78 | Ht 66.0 in | Wt 236.6 lb

## 2023-11-17 DIAGNOSIS — I10 Essential (primary) hypertension: Secondary | ICD-10-CM | POA: Diagnosis not present

## 2023-11-17 DIAGNOSIS — E876 Hypokalemia: Secondary | ICD-10-CM | POA: Diagnosis not present

## 2023-11-17 DIAGNOSIS — H538 Other visual disturbances: Secondary | ICD-10-CM

## 2023-11-17 DIAGNOSIS — I25110 Atherosclerotic heart disease of native coronary artery with unstable angina pectoris: Secondary | ICD-10-CM | POA: Diagnosis not present

## 2023-11-17 DIAGNOSIS — T502X5A Adverse effect of carbonic-anhydrase inhibitors, benzothiadiazides and other diuretics, initial encounter: Secondary | ICD-10-CM

## 2023-11-17 DIAGNOSIS — I639 Cerebral infarction, unspecified: Secondary | ICD-10-CM

## 2023-11-17 NOTE — Progress Notes (Signed)
 Established Patient Office Visit  Subjective:  Patient ID: Travis Williams, male    DOB: 12-31-1955  Age: 68 y.o. MRN: [id number]  Chief Complaint  Patient presents with   Follow-up    1 week follow up    Patient seen today for follow up; Overall he is doing well and has no new complaints. He was scheduled for Head CT due to increased frequency of L eye blurred vision. He reports left eye blurred vision is still present and unchanged since last appointment. Patient never received phone call to schedule imaging. Will provide patient with phone number to contact ORRIN Seals to schedule head CT. Will recheck potassium today as labs on 11/10/23 showed hypokalemia. Patient has been taking potassium replacement supplements as prescribed.     No other concerns at this time.   Past Medical History:  Diagnosis Date   Hypertension    Stroke [facility])     Past Surgical History:  Procedure Laterality Date   CORONARY STENT INTERVENTION N/A 02/19/2021   Procedure: CORONARY STENT INTERVENTION;  Surgeon: Florencio Cara BIRCH, MD;  Location: [facility] INVASIVE CV LAB;  Service: Cardiovascular;  Laterality: N/A;   IR CT HEAD LTD  12/03/2020   IR INTRA CRAN STENT  12/03/2020   IR PERCUTANEOUS ART THROMBECTOMY/INFUSION INTRACRANIAL INC DIAG ANGIO  12/03/2020   IR RADIOLOGIST EVAL & MGMT  02/23/2021   IR US  GUIDE VASC ACCESS RIGHT  12/03/2020   LEFT HEART CATH AND CORONARY ANGIOGRAPHY N/A 02/19/2021   Procedure: LEFT HEART CATH AND CORONARY ANGIOGRAPHY with intervention;  Surgeon: Fernand Denyse LABOR, MD;  Location: [facility] INVASIVE CV LAB;  Service: Cardiovascular;  Laterality: N/A;   PULMONARY THROMBECTOMY Bilateral 05/29/2023   Procedure: PULMONARY THROMBECTOMY;  Surgeon: Marea Selinda RAMAN, MD;  Location: [facility] INVASIVE CV LAB;  Service: Cardiovascular;  Laterality: Bilateral;   RADIOLOGY WITH ANESTHESIA N/A 12/03/2020   Procedure: IR WITH ANESTHESIA;  Surgeon: Radiologist, Medication, MD;  Location: [facility] OR;  Service:  Radiology;  Laterality: N/A;    Social History   Socioeconomic History   Marital status: Single    Spouse name: Not on file   Number of children: 2   Years of education: Not on file   Highest education level: High school graduate  Occupational History   Not on file  Tobacco Use   Smoking status: Former    Current packs/day: 0.00    Average packs/day: 1 pack/day for 5.0 years (5.0 ttl pk-yrs)    Types: Cigarettes    Start date: 9    Quit date: 45    Years since quitting: 28.6   Smokeless tobacco: Never  Vaping Use   Vaping status: Never Used  Substance and Sexual Activity   Alcohol use: Not Currently   Drug use: Never   Sexual activity: Not on file  Other Topics Concern   Not on file  Social History Narrative   Lives w roommate friend   Right handed   Caffeine: energy drink once a week   Social Drivers of Corporate investment banker Strain: Not on file  Food Insecurity: No Food Insecurity (05/28/2023)   Hunger Vital Sign    Worried About Running Out of Food in the Last Year: Never true    Ran Out of Food in the Last Year: Never true  Transportation Needs: No Transportation Needs (05/28/2023)   PRAPARE - Administrator, Civil Service (Medical): No    Lack of Transportation (Non-Medical): No  Physical Activity: Not on  file  Stress: Not on file  Social Connections: Socially Integrated (05/28/2023)   Social Connection and Isolation Panel    Frequency of Communication with Friends and Family: More than three times a week    Frequency of Social Gatherings with Friends and Family: More than three times a week    Attends Religious Services: More than 4 times per year    Active Member of [employer] or Organizations: No    Attends Engineer, structural: More than 4 times per year    Marital Status: Living with partner  Intimate Partner Violence: Not At Risk (05/28/2023)   Humiliation, Afraid, Rape, and Kick questionnaire    Fear of Current or Ex-Partner: No     Emotionally Abused: No    Physically Abused: No    Sexually Abused: No    Family History  Problem Relation Age of Onset   Hypertension Brother     No Known Allergies  Outpatient Medications Prior to Visit  Medication Sig   amLODipine  (NORVASC ) 5 MG tablet Take 1 tablet (5 mg total) by mouth daily.   apixaban  (ELIQUIS ) 5 MG TABS tablet 2 tabs twice a day for 7 days followed by 1 tab twice a day   atorvastatin  (LIPITOR) 80 MG tablet Take 1 tablet (80 mg total) by mouth daily.   labetalol  (NORMODYNE ) 300 MG tablet Take 1 tablet (300 mg total) by mouth 2 (two) times daily.   losartan -hydrochlorothiazide  (HYZAAR) 100-25 MG tablet Take 1 tablet by mouth daily.   potassium chloride  (KLOR-CON  M) 10 MEQ tablet Take 2 tablets twice a day for 3 days. Then take 1 tablet a day on Monday, Wednesday, Fridays   sildenafil  (VIAGRA ) 50 MG tablet Take 0.5 tablets (25 mg total) by mouth as needed for erectile dysfunction.   Facility-Administered Medications Prior to Visit  Medication Dose Route Frequency Provider   sodium chloride  flush (NS) 0.9 % injection 3 mL  3 mL Intravenous Q12H Fernand Denyse LABOR, MD    Review of Systems  Constitutional: Negative.   HENT: Negative.    Eyes:  Positive for blurred vision (L eye).  Respiratory: Negative.  Negative for cough and shortness of breath.   Cardiovascular: Negative.  Negative for chest pain, palpitations and leg swelling.  Gastrointestinal: Negative.  Negative for abdominal pain, constipation, diarrhea, heartburn, nausea and vomiting.  Genitourinary: Negative.  Negative for dysuria and flank pain.  Musculoskeletal: Negative.  Negative for joint pain and myalgias.  Skin: Negative.   Neurological: Negative.  Negative for dizziness and headaches.  Endo/Heme/Allergies: Negative.   Psychiatric/Behavioral: Negative.  Negative for depression and suicidal ideas. The patient is not nervous/anxious.        Objective:   BP 114/72   Pulse 78   Ht 5' 6  (1.676 m)   Wt 236 lb 9.6 oz (107.3 kg)   SpO2 98%   BMI 38.19 kg/m   Vitals:   11/17/23 1324  BP: 114/72  Pulse: 78  Height: 5' 6 (1.676 m)  Weight: 236 lb 9.6 oz (107.3 kg)  SpO2: 98%  BMI (Calculated): 38.21    Physical Exam Vitals and nursing note reviewed.  Constitutional:      Appearance: Normal appearance.  HENT:     Head: Normocephalic and atraumatic.     Nose: Nose normal.     Mouth/Throat:     Mouth: Mucous membranes are moist.     Pharynx: Oropharynx is clear.  Eyes:     Conjunctiva/sclera: Conjunctivae normal.  Pupils: Pupils are equal, round, and reactive to light.  Cardiovascular:     Rate and Rhythm: Normal rate and regular rhythm.     Pulses: Normal pulses.     Heart sounds: Normal heart sounds.  Pulmonary:     Effort: Pulmonary effort is normal.     Breath sounds: Normal breath sounds.  Abdominal:     General: Bowel sounds are normal.     Palpations: Abdomen is soft.  Musculoskeletal:        General: Normal range of motion.     Cervical back: Normal range of motion.  Skin:    General: Skin is warm and dry.  Neurological:     General: No focal deficit present.     Mental Status: He is alert and oriented to person, place, and time.  Psychiatric:        Mood and Affect: Mood normal.        Behavior: Behavior normal.        Judgment: Judgment normal.      No results found for any visits on 11/17/23.  Recent Results (from the past 2160 hours)  CBC with Diff     Status: Abnormal   Collection Time: 11/09/23 12:15 PM  Result Value Ref Range   WBC 6.1 3.4 - 10.8 x10E3/uL   RBC 4.23 4.14 - 5.80 x10E6/uL   Hemoglobin 10.8 (L) 13.0 - 17.7 g/dL   Hematocrit 63.7 (L) 62.4 - 51.0 %   MCV 86 79 - 97 fL   MCH 25.5 (L) 26.6 - 33.0 pg   MCHC 29.8 (L) 31.5 - 35.7 g/dL   RDW 84.9 88.3 - 84.5 %   Platelets 286 150 - 450 x10E3/uL   Neutrophils 48 Not Estab. %   Lymphs 39 Not Estab. %   Monocytes 10 Not Estab. %   Eos 2 Not Estab. %   Basos 1 Not  Estab. %   Neutrophils Absolute 2.9 1.4 - 7.0 x10E3/uL   Lymphocytes Absolute 2.4 0.7 - 3.1 x10E3/uL   Monocytes Absolute 0.6 0.1 - 0.9 x10E3/uL   EOS (ABSOLUTE) 0.1 0.0 - 0.4 x10E3/uL   Basophils Absolute 0.1 0.0 - 0.2 x10E3/uL   Immature Granulocytes 0 Not Estab. %   Immature Grans (Abs) 0.0 0.0 - 0.1 x10E3/uL  CMP14+EGFR     Status: Abnormal   Collection Time: 11/09/23 12:15 PM  Result Value Ref Range   Glucose 121 (H) 70 - 99 mg/dL   BUN 19 8 - 27 mg/dL   Creatinine, Ser 8.48 (H) 0.76 - 1.27 mg/dL   eGFR 50 (L) >40 fO/fpw/8.26   BUN/Creatinine Ratio 13 10 - 24   Sodium 137 134 - 144 mmol/L   Potassium 3.3 (L) 3.5 - 5.2 mmol/L   Chloride 98 96 - 106 mmol/L   CO2 24 20 - 29 mmol/L   Calcium  9.2 8.6 - 10.2 mg/dL   Total Protein 6.9 6.0 - 8.5 g/dL   Albumin 4.3 3.9 - 4.9 g/dL   Globulin, Total 2.6 1.5 - 4.5 g/dL   Bilirubin Total 0.7 0.0 - 1.2 mg/dL   Alkaline Phosphatase 87 44 - 121 IU/L   AST 17 0 - 40 IU/L   ALT 17 0 - 44 IU/L  Lipid Panel w/o Chol/HDL Ratio     Status: None   Collection Time: 11/09/23 12:15 PM  Result Value Ref Range   Cholesterol, Total 159 100 - 199 mg/dL   Triglycerides 94 0 - 149 mg/dL   HDL 43 >  39 mg/dL   VLDL Cholesterol Cal 18 5 - 40 mg/dL   LDL Chol Calc (NIH) 98 0 - 99 mg/dL  Hemoglobin J8r     Status: Abnormal   Collection Time: 11/09/23 12:15 PM  Result Value Ref Range   Hgb A1c MFr Bld 5.8 (H) 4.8 - 5.6 %    Comment:          Prediabetes: 5.7 - 6.4          Diabetes: >6.4          Glycemic control for adults with diabetes: <7.0    Est. average glucose Bld gHb Est-mCnc 120 mg/dL  Sed Rate (ESR)     Status: None   Collection Time: 11/09/23 12:15 PM  Result Value Ref Range   Sed Rate 16 0 - 30 mm/hr      Assessment & Plan:  Continue medications as prescribed. Will recheck potassium levels today. Patient given phone number to schedule head CT; will reach out to review results. Problem List Items Addressed This Visit     Ischemic  stroke [facility])   Primary hypertension - Primary   CAD (coronary artery disease)   Diuretic-induced hypokalemia   Relevant Orders   Potassium   Blurring of visual image of left eye    Return in about 3 months (around 02/17/2024).   Total time spent: 25  minutes  FERNAND FREDY RAMAN, MD  11/17/2023   This document may have been prepared by [facility] Voice Recognition software and as such may include unintentional dictation errors.

## 2023-11-21 ENCOUNTER — Other Ambulatory Visit: Payer: Self-pay | Admitting: Internal Medicine

## 2023-11-21 ENCOUNTER — Ambulatory Visit
Admission: RE | Admit: 2023-11-21 | Discharge: 2023-11-21 | Disposition: A | Discharge status: Home / Self Care | Source: Ambulatory Visit | Attending: Internal Medicine | Admitting: Internal Medicine

## 2023-11-21 DIAGNOSIS — I10 Essential (primary) hypertension: Secondary | ICD-10-CM

## 2023-11-21 DIAGNOSIS — H538 Other visual disturbances: Secondary | ICD-10-CM | POA: Diagnosis not present

## 2023-11-21 DIAGNOSIS — I639 Cerebral infarction, unspecified: Secondary | ICD-10-CM

## 2023-11-21 DIAGNOSIS — E782 Mixed hyperlipidemia: Secondary | ICD-10-CM

## 2023-11-21 MED ORDER — IOHEXOL 300 MG/ML  SOLN
75.0000 mL | Freq: Once | INTRAMUSCULAR | Status: AC | PRN
  Administered 2023-11-21: 75 mL via INTRAVENOUS

## 2023-11-22 LAB — POTASSIUM: Potassium: 4.3 mmol/L (ref 3.5–5.2)

## 2023-11-23 ENCOUNTER — Ambulatory Visit: Payer: Self-pay | Admitting: Internal Medicine

## 2023-11-23 ENCOUNTER — Other Ambulatory Visit: Payer: Self-pay | Admitting: Cardiovascular Disease

## 2023-11-23 DIAGNOSIS — I10 Essential (primary) hypertension: Secondary | ICD-10-CM

## 2023-11-28 NOTE — Progress Notes (Signed)
Pt informed

## 2023-11-29 ENCOUNTER — Other Ambulatory Visit: Payer: Self-pay | Admitting: Cardiovascular Disease

## 2023-11-29 DIAGNOSIS — I739 Peripheral vascular disease, unspecified: Secondary | ICD-10-CM

## 2023-11-29 DIAGNOSIS — I10 Essential (primary) hypertension: Secondary | ICD-10-CM

## 2023-11-29 DIAGNOSIS — E782 Mixed hyperlipidemia: Secondary | ICD-10-CM

## 2023-11-29 DIAGNOSIS — I651 Occlusion and stenosis of basilar artery: Secondary | ICD-10-CM

## 2023-11-29 DIAGNOSIS — I639 Cerebral infarction, unspecified: Secondary | ICD-10-CM

## 2023-11-29 DIAGNOSIS — I25110 Atherosclerotic heart disease of native coronary artery with unstable angina pectoris: Secondary | ICD-10-CM

## 2023-11-29 DIAGNOSIS — G4733 Obstructive sleep apnea (adult) (pediatric): Secondary | ICD-10-CM

## 2023-11-30 ENCOUNTER — Ambulatory Visit: Payer: Self-pay | Admitting: Internal Medicine

## 2023-11-30 ENCOUNTER — Telehealth: Payer: Self-pay

## 2023-11-30 DIAGNOSIS — H538 Other visual disturbances: Secondary | ICD-10-CM

## 2023-11-30 NOTE — Telephone Encounter (Signed)
 Tracey with [facility] Radiology called asking for call back from for CT head results please call her back at [phone]

## 2023-11-30 NOTE — Progress Notes (Signed)
 Patient notified

## 2023-12-04 ENCOUNTER — Ambulatory Visit: Admitting: Adult Health

## 2023-12-05 ENCOUNTER — Ambulatory Visit (HOSPITAL_COMMUNITY)
Admission: RE | Admit: 2023-12-05 | Discharge: 2023-12-05 | Disposition: A | Discharge status: Home / Self Care | Source: Ambulatory Visit | Attending: Neurology | Admitting: Neurology

## 2023-12-05 DIAGNOSIS — I6529 Occlusion and stenosis of unspecified carotid artery: Secondary | ICD-10-CM | POA: Diagnosis not present

## 2023-12-12 ENCOUNTER — Ambulatory Visit: Payer: Self-pay | Admitting: Neurology

## 2023-12-12 NOTE — Progress Notes (Signed)
 Kindly inform the patient that carotid ultrasound study shows no significant narrowing of either carotid arteries in the neck

## 2023-12-18 ENCOUNTER — Encounter: Payer: Self-pay | Admitting: Neurology

## 2023-12-18 ENCOUNTER — Ambulatory Visit: Admitting: Neurology

## 2023-12-18 ENCOUNTER — Telehealth: Payer: Self-pay | Admitting: Neurology

## 2023-12-18 VITALS — BP 129/77 | HR 68 | Ht 66.0 in | Wt 240.0 lb

## 2023-12-18 DIAGNOSIS — I651 Occlusion and stenosis of basilar artery: Secondary | ICD-10-CM

## 2023-12-18 DIAGNOSIS — H53462 Homonymous bilateral field defects, left side: Secondary | ICD-10-CM | POA: Diagnosis not present

## 2023-12-18 DIAGNOSIS — Z8673 Personal history of transient ischemic attack (TIA), and cerebral infarction without residual deficits: Secondary | ICD-10-CM | POA: Diagnosis not present

## 2023-12-18 DIAGNOSIS — I639 Cerebral infarction, unspecified: Secondary | ICD-10-CM | POA: Diagnosis not present

## 2023-12-18 MED ORDER — CLOPIDOGREL BISULFATE 75 MG PO TABS: 75.0000 mg | ORAL_TABLET | Freq: Every day | ORAL | 11 refills | Status: AC

## 2023-12-18 MED ORDER — EZETIMIBE 10 MG PO TABS: 10.0000 mg | ORAL_TABLET | Freq: Every day | ORAL | 3 refills | Status: AC

## 2023-12-18 NOTE — Patient Instructions (Addendum)
 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 I also advised the patient to eat a healthy diet with plenty of whole grains, cereals, fruits and vegetables, exercise regularly and maintain ideal body weight .follow-up for CPAP titration in the sleep lab.  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 followup in the future with me in 6 months as needed or call earlier if needed.   Stroke Prevention Some medical conditions and behaviors can lead to a higher chance of having a stroke. You can help prevent a stroke by eating healthy, exercising, not smoking, and managing any medical conditions you have. Stroke is a leading cause of functional impairment. Primary prevention is particularly important because a majority of strokes are first-time events. Stroke changes the lives of not only those who experience a stroke but also their family and other caregivers. How can this condition affect me? A stroke is a medical emergency and should be treated right away. A stroke can lead to brain damage and can sometimes be life-threatening. If a person gets medical treatment right away, there is a better chance of surviving and recovering from a stroke. What can increase my risk? The following medical conditions may increase your risk of a  stroke: Cardiovascular disease. High blood pressure (hypertension). Diabetes. High cholesterol. Sickle cell disease. Blood clotting disorders (hypercoagulable state). Obesity. Sleep disorders (obstructive sleep apnea). Other risk factors include: Being older than age 38. Having a history of blood clots, stroke, or mini-stroke (transient ischemic attack, TIA). Genetic factors, such as race, ethnicity, or a family history of stroke. Smoking cigarettes or using other tobacco products. Taking birth control pills, especially if you also use tobacco. Heavy use of alcohol or drugs, especially cocaine and methamphetamine. Physical inactivity. What actions can I take to prevent this? Manage your health conditions High cholesterol levels. Eating a healthy diet is important for preventing high cholesterol. If cholesterol cannot be managed through diet alone, you may need to take medicines. Take any prescribed medicines to control your cholesterol as told by your health care provider. Hypertension. To reduce your risk of stroke, try to keep your blood pressure below 130/80. Eating a healthy diet and exercising regularly are important for controlling blood pressure. If these steps are not enough to manage your blood pressure, you may need to take medicines. Take any prescribed medicines to control hypertension as told by your health care provider. Ask your health care provider if you should monitor your blood pressure at home. Have your blood pressure checked every year, even if your blood pressure is normal. Blood pressure increases with age and some medical conditions. Diabetes. Eating a healthy diet and exercising regularly are important parts of managing your blood sugar (glucose). If your blood sugar cannot be managed through diet and exercise, you may need to take medicines. Take any prescribed medicines  to control your diabetes as told by your health care provider. Get evaluated for  obstructive sleep apnea. Talk to your health care provider about getting a sleep evaluation if you snore a lot or have excessive sleepiness. Make sure that any other medical conditions you have, such as atrial fibrillation or atherosclerosis, are managed. Nutrition Follow instructions from your health care provider about what to eat or drink to help manage your health condition. These instructions may include: Reducing your daily calorie intake. Limiting how much salt (sodium) you use to 1,500 milligrams (mg) each day. Using only healthy fats for cooking, such as olive oil, canola oil, or sunflower oil. Eating healthy foods. You can do this by: Choosing foods that are high in fiber, such as whole grains, and fresh fruits and vegetables. Eating at least 5 servings of fruits and vegetables a day. Try to fill one-half of your plate with fruits and vegetables at each meal. Choosing lean protein foods, such as lean cuts of meat, poultry without skin, fish, tofu, beans, and nuts. Eating low-fat dairy products. Avoiding foods that are high in sodium. This can help lower blood pressure. Avoiding foods that have saturated fat, trans fat, and cholesterol. This can help prevent high cholesterol. Avoiding processed and prepared foods. Counting your daily carbohydrate intake.  Lifestyle If you drink alcohol: Limit how much you have to: 0-1 drink a day for women who are not pregnant. 0-2 drinks a day for men. Know how much alcohol is in your drink. In the [place] one drink equals one 12 oz bottle of beer ( ), one 5 oz glass of wine ( ), or one 1 oz glass of hard liquor (44mL). Do not use any products that contain nicotine or tobacco. These products include cigarettes, chewing tobacco, and vaping devices, such as e-cigarettes. If you need help quitting, ask your health care provider. Avoid secondhand smoke. Do not use drugs. Activity  Try to stay at a healthy weight. Get at least 30 minutes of  exercise on most days, such as: Fast walking. Biking. Swimming. Medicines Take over-the-counter and prescription medicines only as told by your health care provider. Aspirin  or blood thinners (antiplatelets or anticoagulants) may be recommended to reduce your risk of forming blood clots that can lead to stroke. Avoid taking birth control pills. Talk to your health care provider about the risks of taking birth control pills if: You are over 46 years old. You smoke. You get very bad headaches. You have had a blood clot. Where to find more information American Stroke Association: www.strokeassociation.org Get help right away if: You or a loved one has any symptoms of a stroke. BE FAST is an easy way to remember the main warning signs of a stroke: B - Balance. Signs are dizziness, sudden trouble walking, or loss of balance. E - Eyes. Signs are trouble seeing or a sudden change in vision. F - Face. Signs are sudden weakness or numbness of the face, or the face or eyelid drooping on one side. A - Arms. Signs are weakness or numbness in an arm. This happens suddenly and usually on one side of the body. S - Speech. Signs are sudden trouble speaking, slurred speech, or trouble understanding what people say. T - Time. Time to call emergency services. Write down what time symptoms started. You or a loved one has other signs of a stroke, such as: A sudden, severe headache with no known cause. Nausea or vomiting. Seizure. These symptoms may represent a serious problem that  is an emergency. Do not wait to see if the symptoms will go away. Get medical help right away. Call your local emergency services (911 in the [place] Do not drive yourself to the hospital. Summary You can help to prevent a stroke by eating healthy, exercising, not smoking, limiting alcohol intake, and managing any medical conditions you may have. Do not use any products that contain nicotine or tobacco. These include cigarettes,  chewing tobacco, and vaping devices, such as e-cigarettes. If you need help quitting, ask your health care provider. Remember BE FAST for warning signs of a stroke. Get help right away if you or a loved one has any of these signs. This information is not intended to replace advice given to you by your health care provider. Make sure you discuss any questions you have with your health care provider. Document Revised: 02/07/2022 Document Reviewed: 02/07/2022 Elsevier Patient Education  2024 [employer].

## 2023-12-18 NOTE — Progress Notes (Signed)
 [facility] [address] [place]. [place] [address] [phone]       OFFICE FOLLOW-UP NOTE  [name] Date of Birth:  Jun 11, 1955 Medical Record Number:  [id number]   HPI:  Last visit 03/04/2021: [name] is a 68 year old African-American male seen today for initial office follow-up visit following hospital admission for stroke in September 2022.  History is obtained from the patient and review of electronic medical records and opossum reviewed pertinent available imaging films in PACS.Travis Williams is a 68 y.o. male who only has PMH known of HTN.  He presented to [facility] 12/03/20 with R hemiplegia and slurred speech. NIHSS 16. Last known well at 1030 and found on floor at 1230CT head was negative. 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 He was given TNKase  and then required intubation. 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 IR team, Dr. Alona, emergently consulted and patient was emergently taken to IR and underwent successful mechanical thrombectomy with rescue stent placement.  He was admitted to the ICU blood pressure was tightly controlled.  He was extubated and did well.  MRI scan showed scattered bilateral punctate cerebellar, brainstem and occipital lobe infarcts.  2D echo showed ejection fraction of 70 to 75%.  Cardiac monitoring did not show significant arrhythmias.  LDL cholesterol 136 mg percent.  Hemoglobin A1c was 5.7.  He was started on aspirin  and Brilinta .  His only mild deficits included dysarthria and right facial droop.  He was discharged home and has done well.  His speech is recovered completely back to normal and he is tolerating aspirin  and Plavix  now as he was unable to afford Brilinta .  He has minor bruising but no bleeding.  He has quit smoking completely as well as alcohol.  His blood pressure is under good control today it is 118/86 6.  He is tolerating Lipitor but does complain of some  muscle aches.  He has not had any follow-up lipid profile checked.  He is able to ambulate independently without assistance.  He feels he is neurologically back to his baseline and has no deficits remaining from his stroke.  He has no new complaints today. Update 09/02/2021 ; He returns for follow-up after last visit 6 months ago.  He states she is doing well.  She has had no recurrent stroke or TIA symptoms.  He remains on aspirin  and Plavix  which is tolerating well without bruising or bleeding.  He has gained some weight and he knows he needs to lose it off.  He had a home sleep study done on 07/19/2021 which showed severe sleep apnea.  If is following up with Dr. Chalice for the same and needs to be scheduled to have CPAP titration.  He had lab work done at last visit on 02/22/2021 and LDL cholesterol is optimal at 64 mg percent.  He is tolerating Lipitor well without muscle aches and pains.  He is also on Pletal  for peripheral vascular disease.  His blood pressure is well controlled today it is 121/73.  Update 09/12/2022 : He returns for follow-up after last visit 1 year ago.  He is doing well.  He has had no recurrent stroke or TIA symptoms.  He is tolerating aspirin  well without bruising or bleeding.  He is tolerating Lipitor well without muscle aches and pains.  He does not know when he has his last lipid profile checked.  He does use a CPAP every night without fail.  He denies  any new recurrent neurological symptoms.  He is also not had any new health problems.  He has quit smoking since after his stroke.SABRAHe had follow-up   carotid ultrasound on 09/15/2021 which showed no significant extracranial stenosis.  Transcranial Doppler study was also normal.  He has no new complaints today.  Update 09/12/2023 : He returns for follow-up after last visit with me a year ago.  He is doing well from stroke standpoint without recurrent stroke or TIA symptoms.  He was admitted on 05/30/2023 for sudden onset of chest pain for  2 days and found to have acute DVT with saddle pulmonary embolus he was treated initially with IV heparin  and vascular surgery did mechanical thrombectomy of the pulmonary embolus.  Hypercoagulable panel labs were negative.  He was switched to Eliquis  which he is tolerating well without bruising or bleeding.  States blood pressure is also under good control.  Tolerating Lipitor well without muscle aches and pains.  He states he has been quite compliant with using his CPAP every night.  He does follow-up in the sleep clinic in office regularly.  His last carotid ultrasound was in July 2024 and had shown no significant bilateral extracranial carotid stenosis.  He has no new complaints. Update 12/18/2023 : Patient is referred back for evaluation today by her primary physician Dr. Fernand for vision changes.  Patient states he woke up 1 day from sleep about a month and a half ago with sudden onset of decreased vision on the left.  Patient initially thought getting new glasses would help but when this was not helpful he saw his primary care physician who ordered a CT scan of the head on 11/30/2023 which I personally reviewed showed bilateral left greater than right occipital infarct with some gyriform enhancement suggestive of subacute stroke.  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  Patient had developed pulmonary embolism in was switched from antiplatelet agents to Eliquis  which she took for a while and has not been off it for 4 weeks and started taking aspirin  81 mg daily.  He has not had any recent lipid profile checked.  He states he is compliant with his CPAP every night for his sleep apnea.  He has not been driving due to vision difficulties.  He states he has had no memory concerns.  He is has no problems with balance or falls or injuries.  Lab work on  11/09/2023 shows hemoglobin A1c to be optimal at 5.8 but LDL is elevated at 98 mg percent.    ROS:   14 system review of systems is positive for chest pain, shortness of breath, pulmonary embolism, DVT dysarthria, slurred speech, weakness, hemiplegia, bruising, myalgias all other systems negative  PMH:  Past Medical History:  Diagnosis Date   Hypertension    Stroke [facility]     Social History:  Social History   Socioeconomic History   Marital status: Single    Spouse name: Not on file   Number of children: 2   Years of education: Not on file   Highest education level: High school graduate  Occupational History   Not on file  Tobacco Use   Smoking status: Former    Current packs/day: 0.00    Average packs/day: 1 pack/day for 5.0 years (5.0 ttl pk-yrs)    Types: Cigarettes    Start date: 19  Quit date: 30    Years since quitting: 28.7   Smokeless tobacco: Never  Vaping Use   Vaping status: Never Used  Substance and Sexual Activity   Alcohol use: Not Currently   Drug use: Never   Sexual activity: Not on file  Other Topics Concern   Not on file  Social History Narrative   Lives w roommate friend   Right handed   Caffeine: energy drink once a week   Social Drivers of Corporate investment banker Strain: Not on file  Food Insecurity: No Food Insecurity (05/28/2023)   Hunger Vital Sign    Worried About Running Out of Food in the Last Year: Never true    Ran Out of Food in the Last Year: Never true  Transportation Needs: No Transportation Needs (05/28/2023)   PRAPARE - Administrator, Civil Service (Medical): No    Lack of Transportation (Non-Medical): No  Physical Activity: Not on file  Stress: Not on file  Social Connections: Socially Integrated (05/28/2023)   Social Connection and Isolation Panel    Frequency of Communication with Friends and Family: More than three times a week    Frequency of Social Gatherings with Friends and Family: More than three  times a week    Attends Religious Services: More than 4 times per year    Active Member of [employer] or Organizations: No    Attends Engineer, structural: More than 4 times per year    Marital Status: Living with partner  Intimate Partner Violence: Not At Risk (05/28/2023)   Humiliation, Afraid, Rape, and Kick questionnaire    Fear of Current or Ex-Partner: No    Emotionally Abused: No    Physically Abused: No    Sexually Abused: No    Medications:   Current Outpatient Medications on File Prior to Visit  Medication Sig Dispense Refill   amLODipine  (NORVASC ) 5 MG tablet Take 1 tablet (5 mg total) by mouth daily. 30 tablet 11   atorvastatin  (LIPITOR) 80 MG tablet Take 1 tablet (80 mg total) by mouth daily. 90 tablet 3   labetalol  (NORMODYNE ) 300 MG tablet Take 1 tablet (300 mg total) by mouth 2 (two) times daily. 180 tablet 1   losartan -hydrochlorothiazide  (HYZAAR) 100-25 MG tablet TAKE 1 TABLET BY MOUTH DAILY 30 tablet 11   potassium chloride  (KLOR-CON  M) 10 MEQ tablet Take 2 tablets twice a day for 3 days. Then take 1 tablet a day on Monday, Wednesday, Fridays 30 tablet 3   sildenafil  (VIAGRA ) 50 MG tablet Take 0.5 tablets (25 mg total) by mouth as needed for erectile dysfunction. 5 tablet 0   Current Facility-Administered Medications on File Prior to Visit  Medication Dose Route Frequency Provider Last Rate Last Admin   sodium chloride  flush (NS) 0.9 % injection 3 mL  3 mL Intravenous Q12H Fernand Denyse LABOR, MD        Allergies:  No Known Allergies  Physical Exam General: Mildly obese middle-aged African-American male, seated, in no evident distress Head: head normocephalic and atraumatic.  Neck: supple with no carotid or supraclavicular bruits Cardiovascular: regular rate and rhythm, no murmurs Musculoskeletal: no deformity Skin:  no rash/petichiae Vascular:  Normal pulses all extremities Vitals:   12/18/23 0741  BP: 129/77  Pulse: 68   Neurologic Exam Mental Status:  Awake and fully alert. Oriented to place and time. Recent and remote memory intact. Attention span, concentration and fund of knowledge appropriate. Mood and affect appropriate.  Travis Williams  Diminished recall 2/3.  Able to name only 7 animals which can walk on forelegs.  Clock drawing 4/4. Cranial Nerves: Fundoscopic exam not done. Pupils equal, briskly reactive to light. Extraocular movements full without nystagmus. Visual fields show moderate left homonymous hemianopsia to confrontation. Hearing intact. Facial sensation intact. Face, tongue, palate moves normally and symmetrically.  Motor: Normal bulk and tone. Normal strength in all tested extremity muscles. Sensory.: intact to touch ,pinprick .position and vibratory sensation.  Coordination: Rapid alternating movements normal in all extremities. Finger-to-nose and heel-to-shin performed accurately bilaterally. Gait and Station: Arises from chair without difficulty. Stance is normal. Gait demonstrates normal stride length and balance . Able to heel, toe and tandem walk with only mild difficulty.  Reflexes: 1+ and symmetric. Toes downgoing.   NIHSS  2 Modified Rankin  2   ASSESSMENT: 68 year old African-American male with scattered posterior circulation infarcts in September 2022 secondary to right vertebral and basilar artery occlusion treated with successful mechanical thrombectomy followed by rescue basilar artery stenting.  Vascular risk factors of hypertension, hyperlipidemia, smoking, sleep apnea and intracranial stenosis.  Patient had done amazingly well with practically no residual deficits or symptoms.  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    PLAN: I had a long d/w patient and his wife about his vision loss episodes likely from occipital infarct his prior history of remote stroke,  risk for recurrent stroke/TIAs, personally independently reviewed imaging studies and stroke evaluation results and answered questions.I recommend we add Plavix  75 mg daily to his aspirin  81 mg daily for 3 months and then Plavix  alone daily   for secondary stroke prevention  given prior history of basilar artery thrombectomy and stenting and maintain strict control of hypertension with blood pressure goal below 130/90, diabetes with hemoglobin A1c goal below 6.5% and lipids with LDL cholesterol goal below 70 mg/dL. I also advised the patient to eat a healthy diet with plenty of whole grains, cereals, fruits and vegetables, exercise regularly and maintain ideal body weight .follow-up for CPAP titration in the sleep lab.  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  Patient advised not to drive till peripheral vision loss improves.  Followup in the future with me in 6 months as needed or call earlier if needed. Greater than 50% of time during this 40 minute visit was spent on counseling,explanation of diagnosis, planning of further management, discussion with patient and family and coordination of care Eather Popp, MD Note: This document was prepared with digital dictation and possible smart phrase technology. Any transcriptional errors that result from this process are unintentional

## 2023-12-18 NOTE — Telephone Encounter (Signed)
 sent to [facility] [phone]

## 2024-01-05 ENCOUNTER — Encounter: Payer: Self-pay | Admitting: Neurology

## 2024-01-11 ENCOUNTER — Ambulatory Visit: Payer: Medicare HMO | Admitting: Neurology

## 2024-01-12 ENCOUNTER — Ambulatory Visit
Admission: RE | Admit: 2024-01-12 | Discharge: 2024-01-12 | Disposition: A | Discharge status: Home / Self Care | Source: Ambulatory Visit | Attending: Neurology | Admitting: Neurology

## 2024-01-12 DIAGNOSIS — I651 Occlusion and stenosis of basilar artery: Secondary | ICD-10-CM | POA: Diagnosis not present

## 2024-01-12 MED ORDER — GADOPICLENOL 0.5 MMOL/ML IV SOLN
10.0000 mL | Freq: Once | INTRAVENOUS | Status: AC | PRN
  Administered 2024-01-12: 10 mL via INTRAVENOUS

## 2024-01-15 ENCOUNTER — Ambulatory Visit: Payer: Self-pay | Admitting: Neurology

## 2024-01-15 NOTE — Progress Notes (Signed)
 Kindly inform the patient that MRI scan of the brain shows multiple old strokes without any new or worrisome findings.  There is slight shrinkage of the brain and hardening of the arteries which is expectedly slightly worse compared to previous MRI from 3 years ago.  Nothing to worry about

## 2024-01-15 NOTE — Progress Notes (Signed)
 Kindly inform the patient that MR angiogram study of the blood vessels of the neck shows only minor plaque in the carotid artery on the left without any significant narrowing. The MR angiogram study of the brain images were not available for reporting at this time

## 2024-01-18 DIAGNOSIS — E785 Hyperlipidemia, unspecified: Secondary | ICD-10-CM | POA: Diagnosis not present

## 2024-01-18 DIAGNOSIS — Z87891 Personal history of nicotine dependence: Secondary | ICD-10-CM | POA: Diagnosis not present

## 2024-01-18 DIAGNOSIS — I2510 Atherosclerotic heart disease of native coronary artery without angina pectoris: Secondary | ICD-10-CM | POA: Diagnosis not present

## 2024-01-18 DIAGNOSIS — N1831 Chronic kidney disease, stage 3a: Secondary | ICD-10-CM | POA: Diagnosis not present

## 2024-01-18 DIAGNOSIS — I69398 Other sequelae of cerebral infarction: Secondary | ICD-10-CM | POA: Diagnosis not present

## 2024-01-18 DIAGNOSIS — Z7902 Long term (current) use of antithrombotics/antiplatelets: Secondary | ICD-10-CM | POA: Diagnosis not present

## 2024-01-18 DIAGNOSIS — I129 Hypertensive chronic kidney disease with stage 1 through stage 4 chronic kidney disease, or unspecified chronic kidney disease: Secondary | ICD-10-CM | POA: Diagnosis not present

## 2024-01-18 DIAGNOSIS — Z6838 Body mass index (BMI) 38.0-38.9, adult: Secondary | ICD-10-CM | POA: Diagnosis not present

## 2024-01-18 NOTE — Progress Notes (Signed)
 Your MR angiogram study of the blood vessels of the brain shows no significant blockage in your stent.  You do still have blockages of the blood vessels lower down towards the back on the right and on the top towards the back on the left which are unchanged from before.  Nothing to worry about

## 2024-01-30 ENCOUNTER — Other Ambulatory Visit: Payer: Self-pay | Admitting: Internal Medicine

## 2024-01-30 DIAGNOSIS — I10 Essential (primary) hypertension: Secondary | ICD-10-CM

## 2024-02-19 ENCOUNTER — Encounter: Payer: Self-pay | Admitting: Internal Medicine

## 2024-02-19 ENCOUNTER — Ambulatory Visit: Admitting: Internal Medicine

## 2024-02-19 VITALS — BP 114/70 | HR 75 | Ht 66.0 in | Wt 241.2 lb

## 2024-02-19 DIAGNOSIS — I6930 Unspecified sequelae of cerebral infarction: Secondary | ICD-10-CM | POA: Diagnosis not present

## 2024-02-19 DIAGNOSIS — E782 Mixed hyperlipidemia: Secondary | ICD-10-CM

## 2024-02-19 DIAGNOSIS — G4733 Obstructive sleep apnea (adult) (pediatric): Secondary | ICD-10-CM

## 2024-02-19 DIAGNOSIS — H538 Other visual disturbances: Secondary | ICD-10-CM

## 2024-02-19 DIAGNOSIS — Z23 Encounter for immunization: Secondary | ICD-10-CM

## 2024-02-19 DIAGNOSIS — Z6838 Body mass index (BMI) 38.0-38.9, adult: Secondary | ICD-10-CM | POA: Diagnosis not present

## 2024-02-19 DIAGNOSIS — Z131 Encounter for screening for diabetes mellitus: Secondary | ICD-10-CM

## 2024-02-19 DIAGNOSIS — I10 Essential (primary) hypertension: Secondary | ICD-10-CM | POA: Diagnosis not present

## 2024-02-19 DIAGNOSIS — E66812 Obesity, class 2: Secondary | ICD-10-CM

## 2024-02-19 NOTE — Progress Notes (Signed)
 Established Patient Office Visit  Subjective:  Patient ID: Travis Williams, male    DOB: 29-Oct-1955  Age: 68 y.o. MRN: [id number]  Chief Complaint  Patient presents with   Follow-up    3 month follow up    Patient is here today for follow up. He reports feeling well and has no new complaints today.He is still having visual problems due to Occipital infarcts. He recently had head CT completed. He has gone to Neurologist since his last appointment and they ordered MRI head/MRA neck. Imaging showed multiple infarcts. Medical treatment as per Neurologist. Patient is due for routine blood work today. Reinforced strict diet control and exercise as tolerated to improve blood pressure, reduce cholesterol and promote weight loss. Will adjust medications based off lab results.  Patient completed cologuard 07/2023 that was negative. He would like a flu shot today.    No other concerns at this time.   Past Medical History:  Diagnosis Date   Hypertension    Stroke [facility])     Past Surgical History:  Procedure Laterality Date   CORONARY STENT INTERVENTION N/A 02/19/2021   Procedure: CORONARY STENT INTERVENTION;  Surgeon: Florencio Cara BIRCH, MD;  Location: [facility] INVASIVE CV LAB;  Service: Cardiovascular;  Laterality: N/A;   IR CT HEAD LTD  12/03/2020   IR INTRA CRAN STENT  12/03/2020   IR PERCUTANEOUS ART THROMBECTOMY/INFUSION INTRACRANIAL INC DIAG ANGIO  12/03/2020   IR RADIOLOGIST EVAL & MGMT  02/23/2021   IR US  GUIDE VASC ACCESS RIGHT  12/03/2020   LEFT HEART CATH AND CORONARY ANGIOGRAPHY N/A 02/19/2021   Procedure: LEFT HEART CATH AND CORONARY ANGIOGRAPHY with intervention;  Surgeon: Fernand Denyse LABOR, MD;  Location: [facility] INVASIVE CV LAB;  Service: Cardiovascular;  Laterality: N/A;   PULMONARY THROMBECTOMY Bilateral 05/29/2023   Procedure: PULMONARY THROMBECTOMY;  Surgeon: Marea Selinda RAMAN, MD;  Location: [facility] INVASIVE CV LAB;  Service: Cardiovascular;  Laterality: Bilateral;   RADIOLOGY WITH ANESTHESIA  N/A 12/03/2020   Procedure: IR WITH ANESTHESIA;  Surgeon: Radiologist, Medication, MD;  Location: [facility] OR;  Service: Radiology;  Laterality: N/A;    Social History   Socioeconomic History   Marital status: Single    Spouse name: Not on file   Number of children: 2   Years of education: Not on file   Highest education level: High school graduate  Occupational History   Not on file  Tobacco Use   Smoking status: Former    Current packs/day: 0.00    Average packs/day: 1 pack/day for 5.0 years (5.0 ttl pk-yrs)    Types: Cigarettes    Start date: 78    Quit date: 33    Years since quitting: 28.9   Smokeless tobacco: Never  Vaping Use   Vaping status: Never Used  Substance and Sexual Activity   Alcohol use: Not Currently   Drug use: Never   Sexual activity: Not on file  Other Topics Concern   Not on file  Social History Narrative   Lives w roommate friend   Right handed   Caffeine: energy drink once a week   Social Drivers of Corporate Investment Banker Strain: Not on file  Food Insecurity: No Food Insecurity (05/28/2023)   Hunger Vital Sign    Worried About Running Out of Food in the Last Year: Never true    Ran Out of Food in the Last Year: Never true  Transportation Needs: No Transportation Needs (05/28/2023)   PRAPARE - Transportation    Lack  of Transportation (Medical): No    Lack of Transportation (Non-Medical): No  Physical Activity: Not on file  Stress: Not on file  Social Connections: Socially Integrated (05/28/2023)   Social Connection and Isolation Panel    Frequency of Communication with Friends and Family: More than three times a week    Frequency of Social Gatherings with Friends and Family: More than three times a week    Attends Religious Services: More than 4 times per year    Active Member of [employer] or Organizations: No    Attends Engineer, Structural: More than 4 times per year    Marital Status: Living with partner  Intimate Partner Violence:  Not At Risk (05/28/2023)   Humiliation, Afraid, Rape, and Kick questionnaire    Fear of Current or Ex-Partner: No    Emotionally Abused: No    Physically Abused: No    Sexually Abused: No    Family History  Problem Relation Age of Onset   Hypertension Brother     No Known Allergies  Outpatient Medications Prior to Visit  Medication Sig   amLODipine  (NORVASC ) 5 MG tablet Take 1 tablet (5 mg total) by mouth daily.   atorvastatin  (LIPITOR) 80 MG tablet Take 1 tablet (80 mg total) by mouth daily.   clopidogrel  (PLAVIX ) 75 MG tablet Take 1 tablet (75 mg total) by mouth daily.   ezetimibe  (ZETIA ) 10 MG tablet Take 1 tablet (10 mg total) by mouth daily.   labetalol  (NORMODYNE ) 300 MG tablet TAKE 1 TABLET(300 MG) BY MOUTH TWICE DAILY   losartan -hydrochlorothiazide  (HYZAAR) 100-25 MG tablet TAKE 1 TABLET BY MOUTH DAILY   sildenafil  (VIAGRA ) 50 MG tablet Take 0.5 tablets (25 mg total) by mouth as needed for erectile dysfunction.   potassium chloride  (KLOR-CON  M) 10 MEQ tablet Take 2 tablets twice a day for 3 days. Then take 1 tablet a day on Monday, Wednesday, Fridays (Patient not taking: Reported on 02/19/2024)   Facility-Administered Medications Prior to Visit  Medication Dose Route Frequency Provider   sodium chloride  flush (NS) 0.9 % injection 3 mL  3 mL Intravenous Q12H Fernand Denyse LABOR, MD    Review of Systems  Constitutional: Negative.  Negative for chills, fever and malaise/fatigue.  HENT: Negative.  Negative for congestion and sore throat.   Eyes:  Positive for blurred vision. Negative for pain.  Respiratory: Negative.  Negative for cough and shortness of breath.   Cardiovascular: Negative.  Negative for chest pain, palpitations and leg swelling.  Gastrointestinal: Negative.  Negative for abdominal pain, blood in stool, constipation, diarrhea, heartburn, melena, nausea and vomiting.  Genitourinary: Negative.  Negative for dysuria, flank pain, frequency and urgency.  Musculoskeletal:  Negative.  Negative for joint pain and myalgias.  Skin: Negative.   Neurological: Negative.  Negative for dizziness, tingling, sensory change, weakness and headaches.  Endo/Heme/Allergies: Negative.   Psychiatric/Behavioral: Negative.  Negative for depression and suicidal ideas. The patient is not nervous/anxious.        Objective:   BP 114/70   Pulse 75   Ht 5' 6 (1.676 m)   Wt 241 lb 3.2 oz (109.4 kg)   SpO2 97%   BMI 38.93 kg/m   Vitals:   02/19/24 1055  BP: 114/70  Pulse: 75  Height: 5' 6 (1.676 m)  Weight: 241 lb 3.2 oz (109.4 kg)  SpO2: 97%  BMI (Calculated): 38.95    Physical Exam Vitals and nursing note reviewed.  Constitutional:      General: He is  not in acute distress.    Appearance: Normal appearance. He is not ill-appearing.  HENT:     Head: Normocephalic and atraumatic.     Nose: Nose normal.     Mouth/Throat:     Mouth: Mucous membranes are moist.     Pharynx: Oropharynx is clear.  Eyes:     Conjunctiva/sclera: Conjunctivae normal.     Pupils: Pupils are equal, round, and reactive to light.  Cardiovascular:     Rate and Rhythm: Normal rate and regular rhythm.     Pulses: Normal pulses.     Heart sounds: Normal heart sounds.  Pulmonary:     Effort: Pulmonary effort is normal.     Breath sounds: Normal breath sounds. No wheezing or rhonchi.  Abdominal:     General: Bowel sounds are normal. There is no distension.     Palpations: Abdomen is soft.     Tenderness: There is no abdominal tenderness.  Musculoskeletal:        General: Normal range of motion.     Cervical back: Normal range of motion and neck supple.     Right lower leg: No edema.     Left lower leg: No edema.  Skin:    General: Skin is warm and dry.     Capillary Refill: Capillary refill takes less than 2 seconds.  Neurological:     General: No focal deficit present.     Mental Status: He is alert and oriented to person, place, and time.     Sensory: No sensory deficit.      Motor: No weakness.  Psychiatric:        Mood and Affect: Mood normal.        Behavior: Behavior normal.        Judgment: Judgment normal.      No results found for any visits on 02/19/24.  No results found for this or any previous visit (from the past 2160 hours).    Assessment & Plan:  Continue medications as prescribed. Check routine blood work today and FU with patient on results. Reinforced strict diet control and exercise as tolerated to promote weight loss. Keep specialists appointments as scheduled. Patient due for AWV. Flu shot given. Problem List Items Addressed This Visit     Primary hypertension   HLD (hyperlipidemia) - Primary   Severe sleep apnea   Blurring of visual image of left eye   Class 2 severe obesity due to excess calories with serious comorbidity and body mass index (BMI) of 38.0 to 38.9 in adult    Return in about 4 weeks (around 03/18/2024) for AWV.   Total time spent: 25 minutes. This time includes review of previous notes and results and patient face to face interaction during today's visit.    FERNAND FREDY RAMAN, MD  02/19/2024   This document may have been prepared by [facility] Voice Recognition software and as such may include unintentional dictation errors.

## 2024-02-20 ENCOUNTER — Ambulatory Visit: Payer: Self-pay | Admitting: Internal Medicine

## 2024-02-20 DIAGNOSIS — D649 Anemia, unspecified: Secondary | ICD-10-CM

## 2024-02-20 DIAGNOSIS — I10 Essential (primary) hypertension: Secondary | ICD-10-CM

## 2024-02-20 DIAGNOSIS — K219 Gastro-esophageal reflux disease without esophagitis: Secondary | ICD-10-CM

## 2024-02-20 LAB — CBC WITH DIFFERENTIAL/PLATELET
Basophils Absolute: 0.1 x10E3/uL (ref 0.0–0.2)
Basos: 1 %
EOS (ABSOLUTE): 0.1 x10E3/uL (ref 0.0–0.4)
Eos: 2 %
Hematocrit: 32 % — ABNORMAL LOW (ref 37.5–51.0)
Hemoglobin: 9.9 g/dL — ABNORMAL LOW (ref 13.0–17.7)
Immature Grans (Abs): 0 x10E3/uL (ref 0.0–0.1)
Immature Granulocytes: 0 %
Lymphocytes Absolute: 2.5 x10E3/uL (ref 0.7–3.1)
Lymphs: 36 %
MCH: 25.4 pg — ABNORMAL LOW (ref 26.6–33.0)
MCHC: 30.9 g/dL — ABNORMAL LOW (ref 31.5–35.7)
MCV: 82 fL (ref 79–97)
Monocytes Absolute: 0.7 x10E3/uL (ref 0.1–0.9)
Monocytes: 11 %
Neutrophils Absolute: 3.4 x10E3/uL (ref 1.4–7.0)
Neutrophils: 50 %
Platelets: 307 x10E3/uL (ref 150–450)
RBC: 3.89 x10E6/uL — ABNORMAL LOW (ref 4.14–5.80)
RDW: 14.9 % (ref 11.6–15.4)
WBC: 6.8 x10E3/uL (ref 3.4–10.8)

## 2024-02-20 LAB — CMP14+EGFR
ALT: 18 IU/L (ref 0–44)
AST: 15 IU/L (ref 0–40)
Albumin: 4.2 g/dL (ref 3.9–4.9)
Alkaline Phosphatase: 87 IU/L (ref 47–123)
BUN/Creatinine Ratio: 10 (ref 10–24)
BUN: 16 mg/dL (ref 8–27)
Bilirubin Total: 0.9 mg/dL (ref 0.0–1.2)
CO2: 25 mmol/L (ref 20–29)
Calcium: 9.5 mg/dL (ref 8.6–10.2)
Chloride: 98 mmol/L (ref 96–106)
Creatinine, Ser: 1.66 mg/dL — ABNORMAL HIGH (ref 0.76–1.27)
Globulin, Total: 3 g/dL (ref 1.5–4.5)
Glucose: 110 mg/dL — ABNORMAL HIGH (ref 70–99)
Potassium: 3.9 mmol/L (ref 3.5–5.2)
Sodium: 137 mmol/L (ref 134–144)
Total Protein: 7.2 g/dL (ref 6.0–8.5)
eGFR: 44 mL/min/1.73 — ABNORMAL LOW (ref >59)

## 2024-02-20 LAB — LIPID PANEL W/O CHOL/HDL RATIO
Cholesterol, Total: 129 mg/dL (ref 100–199)
HDL: 41 mg/dL (ref >39)
LDL Chol Calc (NIH): 66 mg/dL (ref 0–99)
Triglycerides: 119 mg/dL (ref 0–149)
VLDL Cholesterol Cal: 22 mg/dL (ref 5–40)

## 2024-02-20 LAB — HEMOGLOBIN A1C
Est. average glucose Bld gHb Est-mCnc: 117 mg/dL
Hgb A1c MFr Bld: 5.7 % — ABNORMAL HIGH (ref 4.8–5.6)

## 2024-02-20 MED ORDER — PANTOPRAZOLE SODIUM 40 MG PO TBEC: 40.0000 mg | DELAYED_RELEASE_TABLET | Freq: Every day | ORAL | 1 refills | Status: DC

## 2024-02-22 ENCOUNTER — Encounter: Payer: Self-pay | Admitting: Cardiovascular Disease

## 2024-02-22 ENCOUNTER — Other Ambulatory Visit: Payer: Self-pay

## 2024-02-22 ENCOUNTER — Ambulatory Visit: Admitting: Cardiovascular Disease

## 2024-02-22 VITALS — BP 122/76 | HR 86 | Ht 66.0 in | Wt 241.6 lb

## 2024-02-22 DIAGNOSIS — I6930 Unspecified sequelae of cerebral infarction: Secondary | ICD-10-CM

## 2024-02-22 DIAGNOSIS — I25110 Atherosclerotic heart disease of native coronary artery with unstable angina pectoris: Secondary | ICD-10-CM | POA: Diagnosis not present

## 2024-02-22 DIAGNOSIS — K219 Gastro-esophageal reflux disease without esophagitis: Secondary | ICD-10-CM | POA: Diagnosis not present

## 2024-02-22 DIAGNOSIS — I10 Essential (primary) hypertension: Secondary | ICD-10-CM | POA: Diagnosis not present

## 2024-02-22 DIAGNOSIS — E782 Mixed hyperlipidemia: Secondary | ICD-10-CM | POA: Diagnosis not present

## 2024-02-22 DIAGNOSIS — Z955 Presence of coronary angioplasty implant and graft: Secondary | ICD-10-CM | POA: Diagnosis not present

## 2024-02-22 DIAGNOSIS — I739 Peripheral vascular disease, unspecified: Secondary | ICD-10-CM

## 2024-02-22 DIAGNOSIS — D649 Anemia, unspecified: Secondary | ICD-10-CM | POA: Diagnosis not present

## 2024-02-22 DIAGNOSIS — G4733 Obstructive sleep apnea (adult) (pediatric): Secondary | ICD-10-CM

## 2024-02-22 DIAGNOSIS — Z131 Encounter for screening for diabetes mellitus: Secondary | ICD-10-CM

## 2024-02-22 NOTE — Progress Notes (Signed)
 Cardiology Office Note   Date:  02/22/2024   ID:  Travis Williams, DOB 1955/08/13, MRN [id number]  PCP:  Fernand Fredy RAMAN, MD  Cardiologist:  Denyse Fernand, MD      History of Present Illness: Travis Williams is a 68 y.o. male who presents for  Chief Complaint  Patient presents with   Follow-up    3 month follow up    DOE with climbing staires.      Past Medical History:  Diagnosis Date   Hypertension    Stroke [facility])      Past Surgical History:  Procedure Laterality Date   CORONARY STENT INTERVENTION N/A 02/19/2021   Procedure: CORONARY STENT INTERVENTION;  Surgeon: Florencio Cara BIRCH, MD;  Location: [facility] INVASIVE CV LAB;  Service: Cardiovascular;  Laterality: N/A;   IR CT HEAD LTD  12/03/2020   IR INTRA CRAN STENT  12/03/2020   IR PERCUTANEOUS ART THROMBECTOMY/INFUSION INTRACRANIAL INC DIAG ANGIO  12/03/2020   IR RADIOLOGIST EVAL & MGMT  02/23/2021   IR US  GUIDE VASC ACCESS RIGHT  12/03/2020   LEFT HEART CATH AND CORONARY ANGIOGRAPHY N/A 02/19/2021   Procedure: LEFT HEART CATH AND CORONARY ANGIOGRAPHY with intervention;  Surgeon: Fernand Denyse LABOR, MD;  Location: [facility] INVASIVE CV LAB;  Service: Cardiovascular;  Laterality: N/A;   PULMONARY THROMBECTOMY Bilateral 05/29/2023   Procedure: PULMONARY THROMBECTOMY;  Surgeon: Marea Selinda RAMAN, MD;  Location: [facility] INVASIVE CV LAB;  Service: Cardiovascular;  Laterality: Bilateral;   RADIOLOGY WITH ANESTHESIA N/A 12/03/2020   Procedure: IR WITH ANESTHESIA;  Surgeon: Radiologist, Medication, MD;  Location: [facility] OR;  Service: Radiology;  Laterality: N/A;     Current Outpatient Medications  Medication Sig Dispense Refill   amLODipine  (NORVASC ) 5 MG tablet Take 1 tablet (5 mg total) by mouth daily. 30 tablet 11   atorvastatin  (LIPITOR) 80 MG tablet Take 1 tablet (80 mg total) by mouth daily. 90 tablet 3   clopidogrel  (PLAVIX ) 75 MG tablet Take 1 tablet (75 mg total) by mouth daily. 30 tablet 11   ezetimibe  (ZETIA ) 10 MG tablet Take 1 tablet (10 mg  total) by mouth daily. 30 tablet 3   labetalol  (NORMODYNE ) 300 MG tablet TAKE 1 TABLET(300 MG) BY MOUTH TWICE DAILY 180 tablet 1   losartan -hydrochlorothiazide  (HYZAAR) 100-25 MG tablet TAKE 1 TABLET BY MOUTH DAILY 30 tablet 11   pantoprazole  (PROTONIX ) 40 MG tablet Take 1 tablet (40 mg total) by mouth daily. 30 tablet 1   sildenafil  (VIAGRA ) 50 MG tablet Take 0.5 tablets (25 mg total) by mouth as needed for erectile dysfunction. 5 tablet 0   No current facility-administered medications for this visit.   Facility-Administered Medications Ordered in Other Visits  Medication Dose Route Frequency Provider Last Rate Last Admin   sodium chloride  flush (NS) 0.9 % injection 3 mL  3 mL Intravenous Q12H Fernand Denyse LABOR, MD        Allergies:   Patient has no known allergies.    Social History:   reports that he quit smoking about 28 years ago. His smoking use included cigarettes. He started smoking about 33 years ago. He has a 5 pack-year smoking history. He has never used smokeless tobacco. He reports that he does not currently use alcohol. He reports that he does not use drugs.   Family History:  family history includes Hypertension in his brother.    ROS:     Review of Systems  Constitutional: Negative.   HENT: Negative.    Eyes:  Negative.   Respiratory: Negative.    Gastrointestinal: Negative.   Genitourinary: Negative.   Musculoskeletal: Negative.   Skin: Negative.   Neurological: Negative.   Endo/Heme/Allergies: Negative.   Psychiatric/Behavioral: Negative.    All other systems reviewed and are negative.     All other systems are reviewed and negative.    PHYSICAL EXAM: VS:  BP 122/76   Pulse 86   Ht 5' 6 (1.676 m)   Wt 241 lb 9.6 oz (109.6 kg)   SpO2 97%   BMI 39.00 kg/m  , BMI Body mass index is 39 kg/m. Last weight:  Wt Readings from Last 3 Encounters:  02/22/24 241 lb 9.6 oz (109.6 kg)  02/19/24 241 lb 3.2 oz (109.4 kg)  12/18/23 240 lb (108.9 kg)      Physical Exam Vitals reviewed.  Constitutional:      Appearance: Normal appearance. He is normal weight.  HENT:     Head: Normocephalic.     Nose: Nose normal.     Mouth/Throat:     Mouth: Mucous membranes are moist.  Eyes:     Pupils: Pupils are equal, round, and reactive to light.  Cardiovascular:     Rate and Rhythm: Normal rate and regular rhythm.     Pulses: Normal pulses.     Heart sounds: Normal heart sounds.  Pulmonary:     Effort: Pulmonary effort is normal.  Abdominal:     General: Abdomen is flat. Bowel sounds are normal.  Musculoskeletal:        General: Normal range of motion.     Cervical back: Normal range of motion.  Skin:    General: Skin is warm.  Neurological:     General: No focal deficit present.     Mental Status: He is alert.  Psychiatric:        Mood and Affect: Mood normal.       EKG:   Recent Labs: 05/28/2023: B Natriuretic Peptide 52.5 02/19/2024: ALT 18; BUN 16; Creatinine, Ser 1.66; Hemoglobin 9.9; Platelets 307; Potassium 3.9; Sodium 137    Lipid Panel    Component Value Date/Time   CHOL 129 02/19/2024 1207   TRIG 119 02/19/2024 1207   HDL 41 02/19/2024 1207   CHOLHDL 3.3 09/12/2022 1341   CHOLHDL 5.1 12/06/2020 0317   VLDL 24 12/06/2020 0317   LDLCALC 66 02/19/2024 1207   LDLDIRECT UNABLE TO CALCULATE IF TRIGLYCERIDE IS >1293 mg/dL 90/83/7977 9483      Other studies Reviewed: Additional studies/ records that were reviewed today include:  Review of the above records demonstrates:       No data to display            ASSESSMENT AND PLAN:    ICD-10-CM   1. History of placement of stent in LAD coronary artery  Z95.5    2022 had distal LAD stent of 80% lesion., , stress test 8/25 normal EF 66%..    2. Essential hypertension, benign  I10     3. GERD without esophagitis  K21.9     4. Primary hypertension  I10     5. Mixed hyperlipidemia  E78.2     6. OSA on CPAP  G47.33     7. PVD (peripheral vascular  disease)  I73.9     8. History of CVA with residual deficit  I69.30     9. Coronary artery disease involving native coronary artery of native heart with unstable angina pectoris (HCC)  I25.110  Problem List Items Addressed This Visit       Cardiovascular and Mediastinum   Primary hypertension   PVD (peripheral vascular disease)   CAD (coronary artery disease)     Other   HLD (hyperlipidemia)   History of CVA with residual deficit   Other Visit Diagnoses       History of placement of stent in LAD coronary artery    -  Primary   2022 had distal LAD stent of 80% lesion., , stress test 8/25 normal EF 66%..     Essential hypertension, benign         GERD without esophagitis         OSA on CPAP              Disposition:   Return in about 3 months (around 05/22/2024).    Total time spent: 35 minutes  Signed,  Denyse Bathe, MD  02/22/2024 9:34 AM    [facility]

## 2024-02-23 ENCOUNTER — Ambulatory Visit: Payer: Self-pay | Admitting: Internal Medicine

## 2024-02-23 LAB — CBC WITH DIFFERENTIAL/PLATELET
Basophils Absolute: 0.1 x10E3/uL (ref 0.0–0.2)
Basos: 1 %
EOS (ABSOLUTE): 0.2 x10E3/uL (ref 0.0–0.4)
Eos: 3 %
Hematocrit: 33.3 % — ABNORMAL LOW (ref 37.5–51.0)
Hemoglobin: 10 g/dL — ABNORMAL LOW (ref 13.0–17.7)
Immature Grans (Abs): 0 x10E3/uL (ref 0.0–0.1)
Immature Granulocytes: 0 %
Lymphocytes Absolute: 2.2 x10E3/uL (ref 0.7–3.1)
Lymphs: 34 %
MCH: 25.4 pg — ABNORMAL LOW (ref 26.6–33.0)
MCHC: 30 g/dL — ABNORMAL LOW (ref 31.5–35.7)
MCV: 85 fL (ref 79–97)
Monocytes Absolute: 1 x10E3/uL — ABNORMAL HIGH (ref 0.1–0.9)
Monocytes: 16 %
Neutrophils Absolute: 3 x10E3/uL (ref 1.4–7.0)
Neutrophils: 46 %
Platelets: 302 x10E3/uL (ref 150–450)
RBC: 3.93 x10E6/uL — ABNORMAL LOW (ref 4.14–5.80)
RDW: 14.4 % (ref 11.6–15.4)
WBC: 6.4 x10E3/uL (ref 3.4–10.8)

## 2024-03-07 ENCOUNTER — Other Ambulatory Visit: Payer: Self-pay | Admitting: Cardiovascular Disease

## 2024-03-07 DIAGNOSIS — I739 Peripheral vascular disease, unspecified: Secondary | ICD-10-CM

## 2024-03-07 DIAGNOSIS — I651 Occlusion and stenosis of basilar artery: Secondary | ICD-10-CM

## 2024-03-07 DIAGNOSIS — I25110 Atherosclerotic heart disease of native coronary artery with unstable angina pectoris: Secondary | ICD-10-CM

## 2024-03-07 DIAGNOSIS — I10 Essential (primary) hypertension: Secondary | ICD-10-CM

## 2024-03-12 ENCOUNTER — Ambulatory Visit: Admitting: Internal Medicine

## 2024-03-12 ENCOUNTER — Encounter: Payer: Self-pay | Admitting: Internal Medicine

## 2024-03-12 VITALS — BP 112/70 | HR 90 | Ht 66.0 in | Wt 243.4 lb

## 2024-03-12 DIAGNOSIS — I739 Peripheral vascular disease, unspecified: Secondary | ICD-10-CM

## 2024-03-12 DIAGNOSIS — E66812 Obesity, class 2: Secondary | ICD-10-CM | POA: Diagnosis not present

## 2024-03-12 DIAGNOSIS — I10 Essential (primary) hypertension: Secondary | ICD-10-CM

## 2024-03-12 DIAGNOSIS — Z0001 Encounter for general adult medical examination with abnormal findings: Secondary | ICD-10-CM | POA: Diagnosis not present

## 2024-03-12 DIAGNOSIS — Z0000 Encounter for general adult medical examination without abnormal findings: Secondary | ICD-10-CM | POA: Insufficient documentation

## 2024-03-12 DIAGNOSIS — E782 Mixed hyperlipidemia: Secondary | ICD-10-CM | POA: Diagnosis not present

## 2024-03-12 DIAGNOSIS — G4733 Obstructive sleep apnea (adult) (pediatric): Secondary | ICD-10-CM

## 2024-03-12 DIAGNOSIS — Z6839 Body mass index (BMI) 39.0-39.9, adult: Secondary | ICD-10-CM

## 2024-03-12 DIAGNOSIS — Z131 Encounter for screening for diabetes mellitus: Secondary | ICD-10-CM

## 2024-03-12 NOTE — Progress Notes (Signed)
 "  Established Patient Office Visit  Subjective:  Patient ID: Travis Williams, male    DOB: 1956/03/04  Age: 68 y.o. MRN: [id number]  Chief Complaint  Patient presents with   Annual Exam    AWV    Patient is here today for Medicare AWV. He reports feeling well today and has no new concerns to discuss at this time.  He has not gotten the stool cards completed but will get those done and return them to the office. He recently had routine blood work done so he is not due for blood work at this time. Last colon cancer screening was 07/2023 with normal cologuard at that time. He denies any changes to his bowels, denies bloody or mucous in his stools.  He reports taking his medications as prescribed without side effects.  Denies any chest pain, shortness of breath, palpitations at this time. PHQ-9 score 3; GAD-7 score 0. 6CIT score 8.    No other concerns at this time.   Past Medical History:  Diagnosis Date   Hypertension    Stroke [facility])     Past Surgical History:  Procedure Laterality Date   CORONARY STENT INTERVENTION N/A 02/19/2021   Procedure: CORONARY STENT INTERVENTION;  Surgeon: Florencio Cara BIRCH, MD;  Location: [facility] INVASIVE CV LAB;  Service: Cardiovascular;  Laterality: N/A;   IR CT HEAD LTD  12/03/2020   IR INTRA CRAN STENT  12/03/2020   IR PERCUTANEOUS ART THROMBECTOMY/INFUSION INTRACRANIAL INC DIAG ANGIO  12/03/2020   IR RADIOLOGIST EVAL & MGMT  02/23/2021   IR US  GUIDE VASC ACCESS RIGHT  12/03/2020   LEFT HEART CATH AND CORONARY ANGIOGRAPHY N/A 02/19/2021   Procedure: LEFT HEART CATH AND CORONARY ANGIOGRAPHY with intervention;  Surgeon: Fernand Denyse LABOR, MD;  Location: [facility] INVASIVE CV LAB;  Service: Cardiovascular;  Laterality: N/A;   PULMONARY THROMBECTOMY Bilateral 05/29/2023   Procedure: PULMONARY THROMBECTOMY;  Surgeon: Marea Selinda RAMAN, MD;  Location: [facility] INVASIVE CV LAB;  Service: Cardiovascular;  Laterality: Bilateral;   RADIOLOGY WITH ANESTHESIA N/A 12/03/2020   Procedure:  IR WITH ANESTHESIA;  Surgeon: Radiologist, Medication, MD;  Location: [facility] OR;  Service: Radiology;  Laterality: N/A;    Social History   Socioeconomic History   Marital status: Single    Spouse name: Not on file   Number of children: 2   Years of education: Not on file   Highest education level: High school graduate  Occupational History   Not on file  Tobacco Use   Smoking status: Former    Current packs/day: 0.00    Average packs/day: 1 pack/day for 5.0 years (5.0 ttl pk-yrs)    Types: Cigarettes    Start date: 39    Quit date: 40    Years since quitting: 28.9   Smokeless tobacco: Never  Vaping Use   Vaping status: Never Used  Substance and Sexual Activity   Alcohol use: Not Currently   Drug use: Never   Sexual activity: Not on file  Other Topics Concern   Not on file  Social History Narrative   Lives w roommate friend   Right handed   Caffeine: energy drink once a week   Social Drivers of Health   Tobacco Use: Medium Risk (03/12/2024)   Patient History    Smoking Tobacco Use: Former    Smokeless Tobacco Use: Never    Passive Exposure: Not on Actuary Strain: Not on file  Food Insecurity: No Food Insecurity (05/28/2023)   Hunger Vital  Sign    Worried About Programme Researcher, Broadcasting/film/video in the Last Year: Never true    Ran Out of Food in the Last Year: Never true  Transportation Needs: No Transportation Needs (05/28/2023)   PRAPARE - Administrator, Civil Service (Medical): No    Lack of Transportation (Non-Medical): No  Physical Activity: Not on file  Stress: Not on file  Social Connections: Socially Integrated (05/28/2023)   Social Connection and Isolation Panel    Frequency of Communication with Friends and Family: More than three times a week    Frequency of Social Gatherings with Friends and Family: More than three times a week    Attends Religious Services: More than 4 times per year    Active Member of [employer] or Organizations: No     Attends Engineer, Structural: More than 4 times per year    Marital Status: Living with partner  Intimate Partner Violence: Not At Risk (05/28/2023)   Humiliation, Afraid, Rape, and Kick questionnaire    Fear of Current or Ex-Partner: No    Emotionally Abused: No    Physically Abused: No    Sexually Abused: No  Depression (PHQ2-9): Low Risk (07/25/2023)   Depression (PHQ2-9)    PHQ-2 Score: 0  Alcohol Screen: Not on file  Housing: Low Risk (05/28/2023)   Housing Stability Vital Sign    Unable to Pay for Housing in the Last Year: No    Number of Times Moved in the Last Year: 0    Homeless in the Last Year: No  Utilities: Not At Risk (05/28/2023)   AHC Utilities    Threatened with loss of utilities: No  Health Literacy: Not on file    Family History  Problem Relation Age of Onset   Hypertension Brother     Allergies[1]  Show/hide medication list[2]  Review of Systems  Constitutional: Negative.  Negative for chills, fever and malaise/fatigue.  HENT: Negative.  Negative for congestion and sore throat.   Eyes: Negative.  Negative for blurred vision and pain.  Respiratory: Negative.  Negative for cough and shortness of breath.   Cardiovascular: Negative.  Negative for chest pain, palpitations and leg swelling.  Gastrointestinal: Negative.  Negative for abdominal pain, blood in stool, constipation, diarrhea, heartburn, melena, nausea and vomiting.  Genitourinary: Negative.  Negative for dysuria, flank pain, frequency and urgency.  Musculoskeletal: Negative.  Negative for joint pain and myalgias.  Skin: Negative.   Neurological: Negative.  Negative for dizziness, tingling, sensory change, weakness and headaches.  Endo/Heme/Allergies: Negative.   Psychiatric/Behavioral: Negative.  Negative for depression and suicidal ideas. The patient is not nervous/anxious.        Objective:   BP 112/70   Pulse 90   Ht 5' 6 (1.676 m)   Wt 243 lb 6.4 oz (110.4 kg)   SpO2 96%   BMI  39.29 kg/m   Vitals:   03/12/24 1037  BP: 112/70  Pulse: 90  Height: 5' 6 (1.676 m)  Weight: 243 lb 6.4 oz (110.4 kg)  SpO2: 96%  BMI (Calculated): 39.3    Physical Exam Vitals and nursing note reviewed.  Constitutional:      General: He is not in acute distress.    Appearance: Normal appearance. He is not ill-appearing.  HENT:     Head: Normocephalic and atraumatic.     Nose: Nose normal.     Mouth/Throat:     Mouth: Mucous membranes are moist.     Pharynx: Oropharynx  is clear.  Eyes:     Conjunctiva/sclera: Conjunctivae normal.     Pupils: Pupils are equal, round, and reactive to light.  Cardiovascular:     Rate and Rhythm: Normal rate and regular rhythm.     Pulses: Normal pulses.     Heart sounds: Normal heart sounds.  Pulmonary:     Effort: Pulmonary effort is normal.     Breath sounds: Normal breath sounds. No wheezing or rhonchi.  Abdominal:     General: Bowel sounds are normal. There is no distension.     Palpations: Abdomen is soft.     Tenderness: There is no abdominal tenderness.  Musculoskeletal:        General: Normal range of motion.     Cervical back: Normal range of motion and neck supple.     Right lower leg: No edema.     Left lower leg: No edema.  Skin:    General: Skin is warm and dry.     Capillary Refill: Capillary refill takes less than 2 seconds.  Neurological:     General: No focal deficit present.     Mental Status: He is alert and oriented to person, place, and time.     Sensory: No sensory deficit.     Motor: No weakness.  Psychiatric:        Mood and Affect: Mood normal.        Behavior: Behavior normal.        Judgment: Judgment normal.      No results found for any visits on 03/12/24.  Recent Results (from the past 2160 hours)  CMP14+EGFR     Status: Abnormal   Collection Time: 02/19/24 12:07 PM  Result Value Ref Range   Glucose 110 (H) 70 - 99 mg/dL   BUN 16 8 - 27 mg/dL   Creatinine, Ser 8.33 (H) 0.76 - 1.27 mg/dL    eGFR 44 (L) >40 fO/fpw/8.26   BUN/Creatinine Ratio 10 10 - 24   Sodium 137 134 - 144 mmol/L   Potassium 3.9 3.5 - 5.2 mmol/L   Chloride 98 96 - 106 mmol/L   CO2 25 20 - 29 mmol/L   Calcium  9.5 8.6 - 10.2 mg/dL   Total Protein 7.2 6.0 - 8.5 g/dL   Albumin 4.2 3.9 - 4.9 g/dL   Globulin, Total 3.0 1.5 - 4.5 g/dL   Bilirubin Total 0.9 0.0 - 1.2 mg/dL   Alkaline Phosphatase 87 47 - 123 IU/L   AST 15 0 - 40 IU/L   ALT 18 0 - 44 IU/L  Lipid Panel w/o Chol/HDL Ratio     Status: None   Collection Time: 02/19/24 12:07 PM  Result Value Ref Range   Cholesterol, Total 129 100 - 199 mg/dL   Triglycerides 880 0 - 149 mg/dL   HDL 41 >60 mg/dL   VLDL Cholesterol Cal 22 5 - 40 mg/dL   LDL Chol Calc (NIH) 66 0 - 99 mg/dL  CBC with Diff     Status: Abnormal   Collection Time: 02/19/24 12:07 PM  Result Value Ref Range   WBC 6.8 3.4 - 10.8 x10E3/uL   RBC 3.89 (L) 4.14 - 5.80 x10E6/uL   Hemoglobin 9.9 (L) 13.0 - 17.7 g/dL   Hematocrit 67.9 (L) 62.4 - 51.0 %   MCV 82 79 - 97 fL   MCH 25.4 (L) 26.6 - 33.0 pg   MCHC 30.9 (L) 31.5 - 35.7 g/dL   RDW 85.0 88.3 - 84.5 %  Platelets 307 150 - 450 x10E3/uL   Neutrophils 50 Not Estab. %   Lymphs 36 Not Estab. %   Monocytes 11 Not Estab. %   Eos 2 Not Estab. %   Basos 1 Not Estab. %   Neutrophils Absolute 3.4 1.4 - 7.0 x10E3/uL   Lymphocytes Absolute 2.5 0.7 - 3.1 x10E3/uL   Monocytes Absolute 0.7 0.1 - 0.9 x10E3/uL   EOS (ABSOLUTE) 0.1 0.0 - 0.4 x10E3/uL   Basophils Absolute 0.1 0.0 - 0.2 x10E3/uL   Immature Granulocytes 0 Not Estab. %   Immature Grans (Abs) 0.0 0.0 - 0.1 x10E3/uL  Hemoglobin A1c     Status: Abnormal   Collection Time: 02/19/24 12:07 PM  Result Value Ref Range   Hgb A1c MFr Bld 5.7 (H) 4.8 - 5.6 %    Comment:          Prediabetes: 5.7 - 6.4          Diabetes: >6.4          Glycemic control for adults with diabetes: <7.0    Est. average glucose Bld gHb Est-mCnc 117 mg/dL  CBC with Differential/Platelet     Status: Abnormal    Collection Time: 02/22/24  9:58 AM  Result Value Ref Range   WBC 6.4 3.4 - 10.8 x10E3/uL   RBC 3.93 (L) 4.14 - 5.80 x10E6/uL   Hemoglobin 10.0 (L) 13.0 - 17.7 g/dL   Hematocrit 66.6 (L) 62.4 - 51.0 %   MCV 85 79 - 97 fL   MCH 25.4 (L) 26.6 - 33.0 pg   MCHC 30.0 (L) 31.5 - 35.7 g/dL   RDW 85.5 88.3 - 84.5 %   Platelets 302 150 - 450 x10E3/uL   Neutrophils 46 Not Estab. %   Lymphs 34 Not Estab. %   Monocytes 16 Not Estab. %   Eos 3 Not Estab. %   Basos 1 Not Estab. %   Neutrophils Absolute 3.0 1.4 - 7.0 x10E3/uL   Lymphocytes Absolute 2.2 0.7 - 3.1 x10E3/uL   Monocytes Absolute 1.0 (H) 0.1 - 0.9 x10E3/uL   EOS (ABSOLUTE) 0.2 0.0 - 0.4 x10E3/uL   Basophils Absolute 0.1 0.0 - 0.2 x10E3/uL   Immature Granulocytes 0 Not Estab. %   Immature Grans (Abs) 0.0 0.0 - 0.1 x10E3/uL      Assessment & Plan:  Continue medications as prescribed. Reinforced healthy diet and exercise as tolerated to promote weight loss and improve over all healthy. Patient not due for labs at this time. Patient will return completed stool cards. Problem List Items Addressed This Visit       Cardiovascular and Mediastinum   Primary hypertension   PVD (peripheral vascular disease)     Respiratory   OSA (obstructive sleep apnea)     Other   HLD (hyperlipidemia)   Class 2 severe obesity due to excess calories with serious comorbidity and body mass index (BMI) of 38.0 to 38.9 in adult   Medicare annual wellness visit, subsequent - Primary    Return in about 3 months (around 06/10/2024).   Total time spent: 30 minutes. This time includes review of previous notes and results and patient face to face interaction during today's visit.    FERNAND FREDY RAMAN, MD  03/12/2024   This document may have been prepared by [facility] Voice Recognition software and as such may include unintentional dictation errors.     [1] No Known Allergies [2]  Outpatient Medications Prior to Visit  Medication Sig   amLODipine   (NORVASC )  5 MG tablet TAKE 1 TABLET(5 MG) BY MOUTH DAILY   atorvastatin  (LIPITOR) 80 MG tablet Take 1 tablet (80 mg total) by mouth daily.   clopidogrel  (PLAVIX ) 75 MG tablet Take 1 tablet (75 mg total) by mouth daily.   ezetimibe  (ZETIA ) 10 MG tablet Take 1 tablet (10 mg total) by mouth daily.   labetalol  (NORMODYNE ) 300 MG tablet TAKE 1 TABLET(300 MG) BY MOUTH TWICE DAILY   losartan -hydrochlorothiazide  (HYZAAR) 100-25 MG tablet TAKE 1 TABLET BY MOUTH DAILY   pantoprazole  (PROTONIX ) 40 MG tablet Take 1 tablet (40 mg total) by mouth daily.   sildenafil  (VIAGRA ) 50 MG tablet Take 0.5 tablets (25 mg total) by mouth as needed for erectile dysfunction.   Facility-Administered Medications Prior to Visit  Medication Dose Route Frequency Provider   sodium chloride  flush (NS) 0.9 % injection 3 mL  3 mL Intravenous Q12H Fernand Denyse LABOR, MD   "

## 2024-03-27 ENCOUNTER — Emergency Department

## 2024-03-27 ENCOUNTER — Inpatient Hospital Stay
Admission: EM | Admit: 2024-03-27 | Discharge: 2024-03-30 | DRG: 065 | Disposition: A | Discharge status: Home / Self Care | Attending: Internal Medicine | Admitting: Internal Medicine

## 2024-03-27 ENCOUNTER — Observation Stay

## 2024-03-27 ENCOUNTER — Other Ambulatory Visit: Payer: Self-pay

## 2024-03-27 ENCOUNTER — Encounter: Payer: Self-pay | Admitting: *Deleted

## 2024-03-27 DIAGNOSIS — I69398 Other sequelae of cerebral infarction: Secondary | ICD-10-CM

## 2024-03-27 DIAGNOSIS — R471 Dysarthria and anarthria: Secondary | ICD-10-CM | POA: Diagnosis present

## 2024-03-27 DIAGNOSIS — R29704 NIHSS score 4: Secondary | ICD-10-CM | POA: Diagnosis present

## 2024-03-27 DIAGNOSIS — I129 Hypertensive chronic kidney disease with stage 1 through stage 4 chronic kidney disease, or unspecified chronic kidney disease: Secondary | ICD-10-CM | POA: Diagnosis present

## 2024-03-27 DIAGNOSIS — I651 Occlusion and stenosis of basilar artery: Secondary | ICD-10-CM

## 2024-03-27 DIAGNOSIS — I6930 Unspecified sequelae of cerebral infarction: Secondary | ICD-10-CM

## 2024-03-27 DIAGNOSIS — E782 Mixed hyperlipidemia: Secondary | ICD-10-CM

## 2024-03-27 DIAGNOSIS — E66812 Obesity, class 2: Secondary | ICD-10-CM | POA: Diagnosis present

## 2024-03-27 DIAGNOSIS — Z955 Presence of coronary angioplasty implant and graft: Secondary | ICD-10-CM

## 2024-03-27 DIAGNOSIS — I10 Essential (primary) hypertension: Secondary | ICD-10-CM | POA: Diagnosis not present

## 2024-03-27 DIAGNOSIS — D649 Anemia, unspecified: Secondary | ICD-10-CM | POA: Diagnosis not present

## 2024-03-27 DIAGNOSIS — E785 Hyperlipidemia, unspecified: Secondary | ICD-10-CM | POA: Diagnosis not present

## 2024-03-27 DIAGNOSIS — N1831 Chronic kidney disease, stage 3a: Secondary | ICD-10-CM | POA: Diagnosis not present

## 2024-03-27 DIAGNOSIS — Z86718 Personal history of other venous thrombosis and embolism: Secondary | ICD-10-CM

## 2024-03-27 DIAGNOSIS — Z7902 Long term (current) use of antithrombotics/antiplatelets: Secondary | ICD-10-CM

## 2024-03-27 DIAGNOSIS — I2510 Atherosclerotic heart disease of native coronary artery without angina pectoris: Secondary | ICD-10-CM | POA: Diagnosis present

## 2024-03-27 DIAGNOSIS — G4739 Other sleep apnea: Secondary | ICD-10-CM | POA: Diagnosis not present

## 2024-03-27 DIAGNOSIS — Z79899 Other long term (current) drug therapy: Secondary | ICD-10-CM

## 2024-03-27 DIAGNOSIS — R200 Anesthesia of skin: Principal | ICD-10-CM

## 2024-03-27 DIAGNOSIS — Z86711 Personal history of pulmonary embolism: Secondary | ICD-10-CM

## 2024-03-27 DIAGNOSIS — Z6838 Body mass index (BMI) 38.0-38.9, adult: Secondary | ICD-10-CM

## 2024-03-27 DIAGNOSIS — Z751 Person awaiting admission to adequate facility elsewhere: Secondary | ICD-10-CM

## 2024-03-27 DIAGNOSIS — H547 Unspecified visual loss: Secondary | ICD-10-CM | POA: Diagnosis present

## 2024-03-27 DIAGNOSIS — D509 Iron deficiency anemia, unspecified: Secondary | ICD-10-CM | POA: Diagnosis present

## 2024-03-27 DIAGNOSIS — G459 Transient cerebral ischemic attack, unspecified: Secondary | ICD-10-CM | POA: Diagnosis present

## 2024-03-27 DIAGNOSIS — Z9989 Dependence on other enabling machines and devices: Secondary | ICD-10-CM | POA: Diagnosis not present

## 2024-03-27 DIAGNOSIS — R29810 Facial weakness: Secondary | ICD-10-CM | POA: Diagnosis present

## 2024-03-27 DIAGNOSIS — G4731 Primary central sleep apnea: Secondary | ICD-10-CM | POA: Diagnosis not present

## 2024-03-27 DIAGNOSIS — I739 Peripheral vascular disease, unspecified: Secondary | ICD-10-CM | POA: Diagnosis present

## 2024-03-27 DIAGNOSIS — I25110 Atherosclerotic heart disease of native coronary artery with unstable angina pectoris: Secondary | ICD-10-CM

## 2024-03-27 DIAGNOSIS — G8191 Hemiplegia, unspecified affecting right dominant side: Secondary | ICD-10-CM | POA: Diagnosis present

## 2024-03-27 DIAGNOSIS — I639 Cerebral infarction, unspecified: Secondary | ICD-10-CM | POA: Diagnosis present

## 2024-03-27 DIAGNOSIS — E669 Obesity, unspecified: Secondary | ICD-10-CM | POA: Diagnosis present

## 2024-03-27 DIAGNOSIS — K219 Gastro-esophageal reflux disease without esophagitis: Secondary | ICD-10-CM

## 2024-03-27 DIAGNOSIS — Z87891 Personal history of nicotine dependence: Secondary | ICD-10-CM

## 2024-03-27 DIAGNOSIS — H53462 Homonymous bilateral field defects, left side: Secondary | ICD-10-CM | POA: Diagnosis present

## 2024-03-27 DIAGNOSIS — Z23 Encounter for immunization: Secondary | ICD-10-CM

## 2024-03-27 DIAGNOSIS — I6329 Cerebral infarction due to unspecified occlusion or stenosis of other precerebral arteries: Principal | ICD-10-CM | POA: Diagnosis present

## 2024-03-27 DIAGNOSIS — Z8249 Family history of ischemic heart disease and other diseases of the circulatory system: Secondary | ICD-10-CM

## 2024-03-27 LAB — DIFFERENTIAL
Abs Immature Granulocytes: 0.02 K/uL (ref 0.00–0.07)
Basophils Absolute: 0.1 K/uL (ref 0.0–0.1)
Basophils Relative: 1 %
Eosinophils Absolute: 0.1 K/uL (ref 0.0–0.5)
Eosinophils Relative: 1 %
Immature Granulocytes: 0 %
Lymphocytes Relative: 27 %
Lymphs Abs: 2.3 K/uL (ref 0.7–4.0)
Monocytes Absolute: 0.8 K/uL (ref 0.1–1.0)
Monocytes Relative: 10 %
Neutro Abs: 5.2 K/uL (ref 1.7–7.7)
Neutrophils Relative %: 61 %

## 2024-03-27 LAB — COMPREHENSIVE METABOLIC PANEL WITH GFR
ALT: 19 U/L (ref 0–44)
AST: 19 U/L (ref 15–41)
Albumin: 3.9 g/dL (ref 3.5–5.0)
Alkaline Phosphatase: 74 U/L (ref 38–126)
Anion gap: 11 (ref 5–15)
BUN: 16 mg/dL (ref 8–23)
CO2: 24 mmol/L (ref 22–32)
Calcium: 8.5 mg/dL — ABNORMAL LOW (ref 8.9–10.3)
Chloride: 100 mmol/L (ref 98–111)
Creatinine, Ser: 1.48 mg/dL — ABNORMAL HIGH (ref 0.61–1.24)
GFR, Estimated: 51 mL/min — ABNORMAL LOW
Glucose, Bld: 118 mg/dL — ABNORMAL HIGH (ref 70–99)
Potassium: 3.7 mmol/L (ref 3.5–5.1)
Sodium: 135 mmol/L (ref 135–145)
Total Bilirubin: 0.5 mg/dL (ref 0.0–1.2)
Total Protein: 6.6 g/dL (ref 6.5–8.1)

## 2024-03-27 LAB — CBC
HCT: 29.7 % — ABNORMAL LOW (ref 39.0–52.0)
Hemoglobin: 9 g/dL — ABNORMAL LOW (ref 13.0–17.0)
MCH: 24.7 pg — ABNORMAL LOW (ref 26.0–34.0)
MCHC: 30.3 g/dL (ref 30.0–36.0)
MCV: 81.4 fL (ref 80.0–100.0)
Platelets: 275 K/uL (ref 150–400)
RBC: 3.65 MIL/uL — ABNORMAL LOW (ref 4.22–5.81)
RDW: 15.9 % — ABNORMAL HIGH (ref 11.5–15.5)
WBC: 8.5 K/uL (ref 4.0–10.5)
nRBC: 0 % (ref 0.0–0.2)

## 2024-03-27 LAB — CBG MONITORING, ED: Glucose-Capillary: 109 mg/dL — ABNORMAL HIGH (ref 70–99)

## 2024-03-27 LAB — PROTIME-INR
INR: 1.1 (ref 0.8–1.2)
Prothrombin Time: 14.6 s (ref 11.4–15.2)

## 2024-03-27 LAB — APTT: aPTT: 29 s (ref 24–36)

## 2024-03-27 MED ORDER — ATORVASTATIN CALCIUM 20 MG PO TABS
80.0000 mg | ORAL_TABLET | Freq: Every day | ORAL | Status: DC
  Administered 2024-03-28 – 2024-03-30 (×3): 80 mg via ORAL
  Filled 2024-03-27 (×3): qty 4

## 2024-03-27 MED ORDER — SENNOSIDES-DOCUSATE SODIUM 8.6-50 MG PO TABS: 1.0000 | ORAL_TABLET | Freq: Every evening | ORAL | Status: DC | PRN

## 2024-03-27 MED ORDER — CLOPIDOGREL BISULFATE 75 MG PO TABS
75.0000 mg | ORAL_TABLET | Freq: Every day | ORAL | Status: DC
  Administered 2024-03-28 – 2024-03-30 (×3): 75 mg via ORAL
  Filled 2024-03-27 (×3): qty 1

## 2024-03-27 MED ORDER — ONDANSETRON HCL 4 MG/2ML IJ SOLN: 4.0000 mg | Freq: Three times a day (TID) | INTRAMUSCULAR | Status: DC | PRN

## 2024-03-27 MED ORDER — ENOXAPARIN SODIUM 60 MG/0.6ML IJ SOSY
0.5000 mg/kg | PREFILLED_SYRINGE | INTRAMUSCULAR | Status: DC
  Administered 2024-03-27 – 2024-03-29 (×3): 55 mg via SUBCUTANEOUS
  Filled 2024-03-27 (×3): qty 0.6

## 2024-03-27 MED ORDER — EZETIMIBE 10 MG PO TABS
10.0000 mg | ORAL_TABLET | Freq: Every day | ORAL | Status: DC
  Administered 2024-03-28 – 2024-03-30 (×3): 10 mg via ORAL
  Filled 2024-03-27 (×3): qty 1

## 2024-03-27 MED ORDER — PANTOPRAZOLE SODIUM 40 MG PO TBEC
40.0000 mg | DELAYED_RELEASE_TABLET | Freq: Every day | ORAL | Status: DC
  Administered 2024-03-28 – 2024-03-30 (×3): 40 mg via ORAL
  Filled 2024-03-27 (×3): qty 1

## 2024-03-27 MED ORDER — HYDRALAZINE HCL 20 MG/ML IJ SOLN: 5.0000 mg | INTRAMUSCULAR | Status: DC | PRN

## 2024-03-27 MED ORDER — STROKE: EARLY STAGES OF RECOVERY BOOK
Freq: Once | Status: AC
  Administered 2024-03-28: 1

## 2024-03-27 MED ORDER — TENECTEPLASE 25 MG IV KIT
PACK | INTRAVENOUS | Status: AC
  Filled 2024-03-27: qty 5

## 2024-03-27 MED ORDER — ACETAMINOPHEN 650 MG RE SUPP: 650.0000 mg | RECTAL | Status: DC | PRN

## 2024-03-27 MED ORDER — ACETAMINOPHEN 160 MG/5ML PO SOLN: 650.0000 mg | ORAL | Status: DC | PRN

## 2024-03-27 MED ORDER — TENECTEPLASE 50 MG IV KIT
PACK | INTRAVENOUS | Status: AC
  Filled 2024-03-27: qty 10

## 2024-03-27 MED ORDER — IOHEXOL 350 MG/ML SOLN
75.0000 mL | Freq: Once | INTRAVENOUS | Status: AC | PRN
  Administered 2024-03-27: 75 mL via INTRAVENOUS

## 2024-03-27 MED ORDER — SODIUM CHLORIDE 0.9 % IV SOLN: INTRAVENOUS | Status: AC

## 2024-03-27 MED ORDER — ACETAMINOPHEN 325 MG PO TABS: 650.0000 mg | ORAL_TABLET | ORAL | Status: DC | PRN

## 2024-03-27 NOTE — ED Notes (Signed)
 CODE STROKE CALLED  TO CARELINK  AT  6:14PM

## 2024-03-27 NOTE — H&P (Signed)
 " History and Physical    Travis Williams FMW:[id number] DOB: 1955/06/25 DOA: 03/27/2024  Referring MD/NP/PA:   PCP: Fernand Fredy RAMAN, MD   Patient coming from:  The patient is coming from home.     Chief Complaint: Slurred speech, right-sided numbness and weakness  HPI: Travis Williams is a 69 y.o. male with medical history significant of stroke with residual left homonymous hemianopsia, obesity, intracranial stent, DVT and PE (s/p of pulmonary thrombectomy, not on AC), HTN, HLD, CAD, PVD, CKD stage IIIa, obesity, who presents with slurred speech, right-sided numbness and weakness.  Patient states that his symptoms started around 3 PM today, including slurred speech, right-sided numbness and weakness.  He also has mild right facial droop on my examination.  His slurred speech has improved in ED, but still has right-sided numbness and weakness. Patient does not have chest pain, cough, SOB.  No nausea, vomiting, diarrhea or abdominal pain.  No symptoms of UTI.  No fever or chills.  Data reviewed independently and ED Course: pt was found to have WBC 8.5, renal function close to baseline, temperature 99.1, blood pressure 140/62, heart rate 60-70s, RR 18, oxygen saturation 100% on room air.  Patient is placed in telemetry bed for outpatient.  Dr. Merrianne of neurology is consulted.  CT of head: 1. No acute intracranial abnormality. ASPECTS 10. 2. Encephalomalacia in the bilateral occipital lobes, right greater than left, compatible with remote infarcts. Additional small remote infarcts in the superior right cerebellum.  MRI-brain: 1. Acute or early subacute infarct in the left pons. 2. Remote infarcts in the occipital lobes and the right cerebellum.  CTA of head and neck: 1. 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 2. Moderate left P2 PCA stenosis 3. Approximately 40% stenosis of the proximal right ICA  relative to the distal vessel. 4. Motion limits assessment with suspected severe proximal stenosis of the right V1 or V2 vertebral artery which is small. 5. Patent basilar artery stent.   EKG: I have personally reviewed.  Sinus rhythm, QTc 431, low voltage, no ischemic change.   Review of Systems:   General: no fevers, chills, no body weight gain, has fatigue HEENT: no blurry vision, hearing changes or sore throat Respiratory: no dyspnea, coughing, wheezing CV: no chest pain, no palpitations GI: no nausea, vomiting, abdominal pain, diarrhea, constipation GU: no dysuria, burning on urination, increased urinary frequency, hematuria  Ext: no leg edema Neuro: no vision change or hearing loss. Has slurred speech, right-sided weakness and numbness Skin: no rash, no skin tear. MSK: No muscle spasm, no deformity, no limitation of range of movement in spin Heme: No easy bruising.  Travel history: No recent long distant travel.   Allergy: Allergies[1]  Past Medical History:  Diagnosis Date   Hypertension    Stroke [facility])     Past Surgical History:  Procedure Laterality Date   CORONARY STENT INTERVENTION N/A 02/19/2021   Procedure: CORONARY STENT INTERVENTION;  Surgeon: Florencio Cara BIRCH, MD;  Location: [facility] INVASIVE CV LAB;  Service: Cardiovascular;  Laterality: N/A;   IR CT HEAD LTD  12/03/2020   IR INTRA CRAN STENT  12/03/2020   IR PERCUTANEOUS ART THROMBECTOMY/INFUSION INTRACRANIAL INC DIAG ANGIO  12/03/2020   IR RADIOLOGIST EVAL & MGMT  02/23/2021   IR US  GUIDE VASC ACCESS RIGHT  12/03/2020   LEFT HEART CATH AND CORONARY ANGIOGRAPHY N/A 02/19/2021   Procedure: LEFT HEART CATH AND CORONARY  ANGIOGRAPHY with intervention;  Surgeon: Fernand Denyse LABOR, MD;  Location: [facility] INVASIVE CV LAB;  Service: Cardiovascular;  Laterality: N/A;   PULMONARY THROMBECTOMY Bilateral 05/29/2023   Procedure: PULMONARY THROMBECTOMY;  Surgeon: Marea Selinda RAMAN, MD;  Location: [facility] INVASIVE CV LAB;  Service:  Cardiovascular;  Laterality: Bilateral;   RADIOLOGY WITH ANESTHESIA N/A 12/03/2020   Procedure: IR WITH ANESTHESIA;  Surgeon: Radiologist, Medication, MD;  Location: [facility] OR;  Service: Radiology;  Laterality: N/A;    Social History:  reports that he quit smoking about 29 years ago. His smoking use included cigarettes. He started smoking about 34 years ago. He has a 5 pack-year smoking history. He has never used smokeless tobacco. He reports that he does not currently use alcohol. He reports that he does not use drugs.  Family History:  Family History  Problem Relation Age of Onset   Hypertension Brother      Prior to Admission medications  Medication Sig Start Date End Date Taking? Authorizing Provider  amLODipine  (NORVASC ) 5 MG tablet TAKE 1 TABLET(5 MG) BY MOUTH DAILY 03/07/24  Yes Fernand Denyse LABOR, MD  atorvastatin  (LIPITOR) 80 MG tablet Take 1 tablet (80 mg total) by mouth daily. 08/08/23 08/07/24 Yes Fernand Fredy RAMAN, MD  clopidogrel  (PLAVIX ) 75 MG tablet Take 1 tablet (75 mg total) by mouth daily. 12/18/23  Yes Rosemarie Eather RAMAN, MD  ezetimibe  (ZETIA ) 10 MG tablet Take 1 tablet (10 mg total) by mouth daily. 12/18/23  Yes Rosemarie Eather RAMAN, MD  labetalol  (NORMODYNE ) 300 MG tablet TAKE 1 TABLET(300 MG) BY MOUTH TWICE DAILY 01/30/24  Yes Fernand Fredy RAMAN, MD  losartan -hydrochlorothiazide  (HYZAAR) 100-25 MG tablet TAKE 1 TABLET BY MOUTH DAILY 11/30/23 11/29/24 Yes Fernand Denyse LABOR, MD  pantoprazole  (PROTONIX ) 40 MG tablet Take 1 tablet (40 mg total) by mouth daily. 02/20/24 02/19/25 Yes Fernand Fredy RAMAN, MD  sildenafil  (VIAGRA ) 50 MG tablet Take 0.5 tablets (25 mg total) by mouth as needed for erectile dysfunction. 03/07/23 03/12/24  Fernand Denyse LABOR, MD    Physical Exam: Vitals:   03/27/24 1936 03/27/24 2000 03/27/24 2200 03/27/24 2359  BP: (!) 140/62 136/64 (!) 143/64 138/67  Pulse: 65 63 66 65  Resp: 16 17 16 16   Temp:   98.7 F (37.1 C) 98.5 F (36.9 C)  TempSrc:   Oral   SpO2: 100% 99% 95% 95%   Weight:   109.3 kg   Height:   5' 6 (1.676 m)    General: Not in acute distress HEENT:       Eyes: PERRL, EOMI, no jaundice       ENT: No discharge from the ears and nose, no pharynx injection, no tonsillar enlargement.        Neck: No JVD, no bruit, no mass felt. Heme: No neck lymph node enlargement. Cardiac: S1/S2, RRR, No murmurs, No gallops or rubs. Respiratory: No rales, wheezing, rhonchi or rubs. GI: Soft, nondistended, nontender, no rebound pain, no organomegaly, BS present. GU: No hematuria Ext: No pitting leg edema bilaterally. 1+DP/PT pulse bilaterally. Musculoskeletal: No joint deformities, No joint redness or warmth, no limitation of ROM in spin. Skin: No rashes.  Neuro: Alert, oriented X3, cranial nerves II-XII grossly intact except for mild right facial droop, muscle strength 4/5 in right leg and 5/5 in all other extremities, sensation to light touch intact.  Psych: Patient is not psychotic, no suicidal or hemocidal ideation.  Labs on Admission: I have personally reviewed following labs and imaging studies  CBC: Recent Labs  Lab 03/27/24 1820  WBC 8.5  NEUTROABS 5.2  HGB 9.0*  HCT 29.7*  MCV 81.4  PLT 275   Basic Metabolic Panel: Recent Labs  Lab 03/27/24 1820  NA 135  K 3.7  CL 100  CO2 24  GLUCOSE 118*  BUN 16  CREATININE 1.48*  CALCIUM  8.5*   GFR: Estimated Creatinine Clearance: 54.6 mL/min (A) (by C-G formula based on SCr of 1.48 mg/dL (H)). Liver Function Tests: Recent Labs  Lab 03/27/24 1820  AST 19  ALT 19  ALKPHOS 74  BILITOT 0.5  PROT 6.6  ALBUMIN 3.9   No results for input(s): LIPASE, AMYLASE in the last 168 hours. No results for input(s): AMMONIA in the last 168 hours. Coagulation Profile: Recent Labs  Lab 03/27/24 1820  INR 1.1   Cardiac Enzymes: No results for input(s): CKTOTAL, CKMB, CKMBINDEX, TROPONINI in the last 168 hours. BNP (last 3 results) No results for input(s): PROBNP in the last 8760  hours. HbA1C: No results for input(s): HGBA1C in the last 72 hours. CBG: Recent Labs  Lab 03/27/24 1809  GLUCAP 109*   Lipid Profile: No results for input(s): CHOL, HDL, LDLCALC, TRIG, CHOLHDL, LDLDIRECT in the last 72 hours. Thyroid Function Tests: No results for input(s): TSH, T4TOTAL, FREET4, T3FREE, THYROIDAB in the last 72 hours. Anemia Panel: No results for input(s): VITAMINB12, FOLATE, FERRITIN, TIBC, IRON , RETICCTPCT in the last 72 hours. Urine analysis:    Component Value Date/Time   COLORURINE YELLOW (A) 05/29/2023 0153   APPEARANCEUR CLEAR (A) 05/29/2023 0153   LABSPEC 1.017 05/29/2023 0153   PHURINE 5.0 05/29/2023 0153   GLUCOSEU NEGATIVE 05/29/2023 0153   HGBUR NEGATIVE 05/29/2023 0153   BILIRUBINUR NEGATIVE 05/29/2023 0153   KETONESUR NEGATIVE 05/29/2023 0153   PROTEINUR NEGATIVE 05/29/2023 0153   NITRITE NEGATIVE 05/29/2023 0153   LEUKOCYTESUR NEGATIVE 05/29/2023 0153   Sepsis Labs: @LABRCNTIP (procalcitonin:4,lacticidven:4) )No results found for this or any previous visit (from the past 240 hours).   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  CTA of the head and neck showed multiple stenosis as above.  Consulted Dr. Merrianne of neurology.  - Placed on tele bed for observation - will hold oral Bp meds to allow permissive HTN - continue home Plavix  and add aspirin  81 mg daily - Statin: Lipitor 80 mg daily - pt had A1c 5.7 and LDL  66 on 02/19/2024. - Patient had 2D echo on 05/29/2023 which showed EF of 60-65%, will not repeat today per Dr. Merrianne - swallowing screen. If fails, will get SLP - Check UDS  - PT/OT consult  Primary hypertension: - prn IV hydralazine  for SBP>220 or dBP>120 - Hold home amlodipine , labetalol  and oral hydralazine   HLD (hyperlipidemia) -Lipitor and Zetia   CAD (coronary artery disease): No chest pain -On aspirin , Plavix , Zetia , Lipitor  Chronic renal failure, stage 3a (HCC): Renal function close to baseline.  Patient had creatinine 1.22 on 07/25/2023, 166 on 02/19/2024.  Today his creatinine is 1.48, BUN 16, GFR  51. -Follow-up with CBC  Normocytic anemia: Hemoglobin 9.0 (10.0 on 12//25).  No active bleeding. -For now repeat CBC  Complex sleep apnea syndrome and dependence on bilevel positive airway pressure (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 -Ordered  BiPAP  Obesity (BMI 30-39.9): Patient has Obesity Class II, with body weight 109.3 Kg and BMI38.89  kg/m2.  - Encourage losing weight - Exercise and healthy diet      DVT ppx:  SQ Lovenox   Code Status: Full code   Family Communication:  Yes, patient's wife at bed side.    Disposition Plan:  Anticipate discharge back to previous environment  Consults called: Dr. Merrianne of neurology  Admission status and Level of care: Telemetry:    for obs     Dispo: The patient is from: Home              Anticipated d/c is to: Home              Anticipated d/c date is: 1 day              Patient currently is not medically stable to d/c.    Severity of Illness:  The appropriate patient status for this patient is OBSERVATION. Observation status is judged to be reasonable and necessary in order to provide the required intensity of service to ensure the patient's safety. The patient's presenting symptoms, physical exam findings, and initial radiographic and laboratory data in the context of their medical condition is felt to place  them at decreased risk for further clinical deterioration. Furthermore, it is anticipated that the patient will be medically stable for discharge from the hospital within 2 midnights of admission.        Date of Service 03/28/2024    Joley Utecht [facility]   If 7PM-7AM, please contact night-coverage [email or web address] 03/28/2024, 1:00 AM     [1] No Known Allergies  "

## 2024-03-27 NOTE — ED Triage Notes (Addendum)
 Pt to triage via wheelchair.  Pt has slurred speech and numbness to right side of body since 1500 today.  No headache.  No n/v  no chest pain or sob.  No dizziness.   Pt alert.  Fsbs 109 in triage.

## 2024-03-27 NOTE — ED Provider Notes (Signed)
 "  [facility] Provider Note    Event Date/Time   First MD Initiated Contact with Patient 03/27/24 1819     (approximate)   History   Numbness   HPI  Travis Williams is a 69 y.o. male with a history of hypertension and CVA who presents with acute onset of slurred speech and right-sided numbness around 3 PM today.  The patient states that the symptoms are improving.  He still has numbness in his right leg but the arm is improved and his speech is improved.  He denies any headache or dizziness.  He has no chest pain or difficulty breathing.  I reviewed the past medical records.  The patient's most recent outpatient counter was on 12/23 with primary care for an annual Medicare wellness visit.  He had no acute issues at that time.   Physical Exam   Triage Vital Signs: ED Triage Vitals  Encounter Vitals Group     BP 03/27/24 1812 (!) 152/65     Girls Systolic BP Percentile --      Girls Diastolic BP Percentile --      Boys Systolic BP Percentile --      Boys Diastolic BP Percentile --      Pulse Rate 03/27/24 1812 79     Resp 03/27/24 1812 18     Temp 03/27/24 1812 99.1 F (37.3 C)     Temp Source 03/27/24 1812 Oral     SpO2 03/27/24 1812 98 %     Weight 03/27/24 1810 237 lb (107.5 kg)     Height 03/27/24 1810 5' 6 (1.676 m)     Head Circumference --      Peak Flow --      Pain Score 03/27/24 1810 0     Pain Loc --      Pain Education --      Exclude from Growth Chart --     Most recent vital signs: Vitals:   03/27/24 1936 03/27/24 2000  BP: (!) 140/62 136/64  Pulse: 65 63  Resp: 16 17  Temp:    SpO2: 100% 99%     General: Alert and oriented, comfortable appearing, no distress.  CV:  Good peripheral perfusion.  Resp:  Normal effort.  Abd:  No distention.  Other:  Normal speech.  Subjective decrease sensation to the right leg.  Motor intact in all extremities.  No facial droop.  No ataxia.   ED Results / Procedures / Treatments    Labs (all labs ordered are listed, but only abnormal results are displayed) Labs Reviewed  CBC - Abnormal; Notable for the following components:      Result Value   RBC 3.65 (*)    Hemoglobin 9.0 (*)    HCT 29.7 (*)    MCH 24.7 (*)    RDW 15.9 (*)    All other components within normal limits  COMPREHENSIVE METABOLIC PANEL WITH GFR - Abnormal; Notable for the following components:   Glucose, Bld 118 (*)    Creatinine, Ser 1.48 (*)    Calcium  8.5 (*)    GFR, Estimated 51 (*)    All other components within normal limits  CBG MONITORING, ED - Abnormal; Notable for the following components:   Glucose-Capillary 109 (*)    All other components within normal limits  PROTIME-INR  APTT  DIFFERENTIAL  URINE DRUG SCREEN     EKG  ED ECG REPORT I, Waylon Cassis, the attending physician, personally viewed and interpreted  this ECG.  Date: 03/27/2024 EKG Time: 1813 Rate: 73 Rhythm: normal sinus rhythm QRS Axis: normal Intervals: normal ST/T Wave abnormalities: normal Narrative Interpretation: no evidence of acute ischemia    RADIOLOGY  CT head: I independently viewed and interpreted the images; there is no ICH.  Radiology report indicates the following:  IMPRESSION:  1. No acute intracranial abnormality. ASPECTS 10.  2. Encephalomalacia in the bilateral occipital lobes, right greater than left,  compatible with remote infarcts. Additional small remote infarcts in the  superior right cerebellum.  3. Findings messaged to Dr. Lindzen via the [facility] messaging system at 6:34 PM  on 03/27/24.     PROCEDURES:  Critical Care performed: Yes, see critical care procedure note(s)  .Critical Care  Performed by: Jacolyn Pae, MD Authorized by: Jacolyn Pae, MD   Critical care provider statement:    Critical care time (minutes):  30   Critical care time was exclusive of:  Separately billable procedures and treating other patients   Critical care was necessary  to treat or prevent imminent or life-threatening deterioration of the following conditions:  CNS failure or compromise   Critical care was time spent personally by me on the following activities:  Development of treatment plan with patient or surrogate, discussions with consultants, evaluation of patient's response to treatment, examination of patient, ordering and review of laboratory studies, ordering and review of radiographic studies, ordering and performing treatments and interventions, pulse oximetry, re-evaluation of patient's condition, review of old charts and obtaining history from patient or surrogate   Care discussed with: admitting provider      MEDICATIONS ORDERED IN ED: Medications  ondansetron  (ZOFRAN ) injection 4 mg (has no administration in time range)  hydrALAZINE  (APRESOLINE ) injection 5 mg (has no administration in time range)   stroke: early stages of recovery book (has no administration in time range)  0.9 %  sodium chloride  infusion (has no administration in time range)  acetaminophen  (TYLENOL ) tablet 650 mg (has no administration in time range)    Or  acetaminophen  (TYLENOL ) 160 MG/5ML solution 650 mg (has no administration in time range)    Or  acetaminophen  (TYLENOL ) suppository 650 mg (has no administration in time range)  senna-docusate (Senokot-S) tablet 1 tablet (has no administration in time range)  enoxaparin  (LOVENOX ) injection 55 mg (has no administration in time range)  iohexol  (OMNIPAQUE ) 350 MG/ML injection 75 mL (75 mLs Intravenous Contrast Given 03/27/24 1848)     IMPRESSION / MDM / ASSESSMENT AND PLAN / ED COURSE  I reviewed the triage vital signs and the nursing notes.  69 year old male with PMH as noted above presents with acute onset of right-sided numbness and slurred speech.  Code stroke was activated.  Differential diagnosis includes, but is not limited to, CVA, TIA, complex migraine.  Patient's presentation is most consistent with acute  presentation with potential threat to life or bodily function.  The patient is on the cardiac monitor to evaluate for evidence of arrhythmia and/or significant heart rate changes.  Dr. Lindzen from neurology was consulted.  CT head without contrast shows no acute findings.  CT angio and lab workup are pending.  ----------------------------------------- 8:03 PM on 03/27/2024 -----------------------------------------   Lab workup is unremarkable.  CMP and CBC show no acute findings except for mild anemia.  CTA is negative for LVO.  I consulted Dr. Hilma from the hospitalist service; based on our discussion he agrees to evaluate the patient for admission.   FINAL CLINICAL IMPRESSION(S) / ED DIAGNOSES  Final diagnoses:  Right sided numbness  Dysarthria     Rx / DC Orders   ED Discharge Orders     None        Note:  This document was prepared using Dragon voice recognition software and may include unintentional dictation errors.    Jacolyn Pae, MD 03/27/24 2144  "

## 2024-03-27 NOTE — Consult Note (Signed)
 CODE STROKE- PHARMACY COMMUNICATION   Time CODE STROKE called/page received:1814  Time response to CODE STROKE was made (in person or via phone): in person  Time Stroke Kit retrieved from [employer] (only if needed): N/a  Name of Provider/Nurse contacted:Dr. Merrianne  Past Medical History:  Diagnosis Date   Hypertension    Stroke [facility])    Prior to Admission medications  Medication Sig Start Date End Date Taking? Authorizing Provider  amLODipine  (NORVASC ) 5 MG tablet TAKE 1 TABLET(5 MG) BY MOUTH DAILY 03/07/24   Fernand Denyse LABOR, MD  atorvastatin  (LIPITOR) 80 MG tablet Take 1 tablet (80 mg total) by mouth daily. 08/08/23 08/07/24  Fernand Fredy RAMAN, MD  clopidogrel  (PLAVIX ) 75 MG tablet Take 1 tablet (75 mg total) by mouth daily. 12/18/23   Rosemarie Eather RAMAN, MD  ezetimibe  (ZETIA ) 10 MG tablet Take 1 tablet (10 mg total) by mouth daily. 12/18/23   Rosemarie Eather RAMAN, MD  labetalol  (NORMODYNE ) 300 MG tablet TAKE 1 TABLET(300 MG) BY MOUTH TWICE DAILY 01/30/24   Fernand Fredy RAMAN, MD  losartan -hydrochlorothiazide  (HYZAAR) 100-25 MG tablet TAKE 1 TABLET BY MOUTH DAILY 11/30/23 11/29/24  Fernand Denyse LABOR, MD  pantoprazole  (PROTONIX ) 40 MG tablet Take 1 tablet (40 mg total) by mouth daily. 02/20/24 02/19/25  Fernand Fredy RAMAN, MD  sildenafil  (VIAGRA ) 50 MG tablet Take 0.5 tablets (25 mg total) by mouth as needed for erectile dysfunction. 03/07/23 03/12/24  Fernand Denyse LABOR, MD    Annabella LOISE Banks ,PharmD Clinical Pharmacist  03/27/2024  6:58 PM

## 2024-03-27 NOTE — ED Notes (Signed)
 Patient transported to MRI

## 2024-03-27 NOTE — Consult Note (Signed)
 NEUROLOGY CONSULT NOTE   Date of service: March 27, 2024 Patient Name: Travis Williams MRN:  [id number] DOB:  06-17-1955 Chief Complaint: Right sided weakness Requesting Provider: Jacolyn Pae, MD  History of Present Illness  Travis Williams is a 69 y.o. male with a PMHx of stroke with residual left homonymous hemianopsia, obesity, intracranial stent, pulmonary thrombectomy and HTN who presents to the ED from home for assessment of acute onset right sided numbness, weakness and slurred speech. Symptoms began at 3:00 PM today. He denies headache, CP, N/V and dizziness. Code Stroke called in the ED.     LKW: 3:00 PM Modified rankin score: 2-Slight disability-UNABLE to perform all activities but does not need assistance IV Thrombolysis: No: After discussion of risks and benefits of TNK treatment versus conservative management alone, the patient declined treatment with TNK EVT: No: CTA negative for LVO   NIHSS components Score: Comment  1a Level of Conscious 0[x]  1[]  2[]  3[]      1b LOC Questions 0[x]  1[]  2[]       1c LOC Commands 0[x]  1[]  2[]       2 Best Gaze 0[x]  1[]  2[]       3 Visual 0[]  1[]  2[x]  3[]     Left homonymous hemianopsia (chronic)  4 Facial Palsy 0[]  1[]  2[x]  3[]     Right facial droop  5a Motor Arm - left 0[x]  1[]  2[]  3[]  4[]  UN[]    5b Motor Arm - Right 0[x]  1[]  2[]  3[]  4[]  UN[]   4/5 triceps, otherwise 5/5  6a Motor Leg - Left 0[x]  1[]  2[]  3[]  4[]  UN[]    6b Motor Leg - Right 0[]  1[x]  2[]  3[]  4[]  UN[]   Slight drift. 4/5 strength against resistance  7 Limb Ataxia 0[x]  1[]  2[]  UN[]      8 Sensory 0[]  1[x]  2[]  UN[]     Decreased on the right  9 Best Language 0[x]  1[]  2[]  3[]      10 Dysarthria 0[]  1[x]  2[]  UN[]      11 Extinct. and Inattention 0[x]  1[]  2[]       TOTAL:   7      ROS  Comprehensive ROS performed and pertinent positives documented in HPI    Past History   Past Medical History:  Diagnosis Date   Hypertension    Stroke [facility])     Past Surgical History:   Procedure Laterality Date   CORONARY STENT INTERVENTION N/A 02/19/2021   Procedure: CORONARY STENT INTERVENTION;  Surgeon: Florencio Cara BIRCH, MD;  Location: [facility] INVASIVE CV LAB;  Service: Cardiovascular;  Laterality: N/A;   IR CT HEAD LTD  12/03/2020   IR INTRA CRAN STENT  12/03/2020   IR PERCUTANEOUS ART THROMBECTOMY/INFUSION INTRACRANIAL INC DIAG ANGIO  12/03/2020   IR RADIOLOGIST EVAL & MGMT  02/23/2021   IR US  GUIDE VASC ACCESS RIGHT  12/03/2020   LEFT HEART CATH AND CORONARY ANGIOGRAPHY N/A 02/19/2021   Procedure: LEFT HEART CATH AND CORONARY ANGIOGRAPHY with intervention;  Surgeon: Fernand Denyse LABOR, MD;  Location: [facility] INVASIVE CV LAB;  Service: Cardiovascular;  Laterality: N/A;   PULMONARY THROMBECTOMY Bilateral 05/29/2023   Procedure: PULMONARY THROMBECTOMY;  Surgeon: Marea Selinda RAMAN, MD;  Location: [facility] INVASIVE CV LAB;  Service: Cardiovascular;  Laterality: Bilateral;   RADIOLOGY WITH ANESTHESIA N/A 12/03/2020   Procedure: IR WITH ANESTHESIA;  Surgeon: Radiologist, Medication, MD;  Location: [facility] OR;  Service: Radiology;  Laterality: N/A;    Family History: Family History  Problem Relation Age of Onset   Hypertension Brother     Social History  reports  that he quit smoking about 29 years ago. His smoking use included cigarettes. He started smoking about 34 years ago. He has a 5 pack-year smoking history. He has never used smokeless tobacco. He reports that he does not currently use alcohol. He reports that he does not use drugs.  Allergies[1]  Medications  Current Medications[2]  Vitals   Vitals:   03/27/24 1810 03/27/24 1812  BP:  (!) 152/65  Pulse:  79  Resp:  18  Temp:  99.1 F (37.3 C)  TempSrc:  Oral  SpO2:  98%  Weight: 107.5 kg   Height: 5' 6 (1.676 m)     Body mass index is 38.25 kg/m.   Physical Exam   Constitutional: Appears well-developed and well-nourished.  Psych: Affect appropriate to situation.  Eyes: No scleral injection.  HENT: No OP obstruction.   Head: Normocephalic.  Respiratory: Effort normal, non-labored breathing.  Skin: WDI.   Neurologic Examination   See NIHSS  Labs/Imaging/Neurodiagnostic studies   CBC: No results for input(s): WBC, NEUTROABS, HGB, HCT, MCV, PLT in the last 168 hours. Basic Metabolic Panel:  Lab Results  Component Value Date   NA 137 02/19/2024   K 3.9 02/19/2024   CO2 25 02/19/2024   GLUCOSE 110 (H) 02/19/2024   BUN 16 02/19/2024   CREATININE 1.66 (H) 02/19/2024   CALCIUM  9.5 02/19/2024   GFRNONAA 59 (L) 05/29/2023   Lipid Panel:  Lab Results  Component Value Date   LDLCALC 66 02/19/2024   HgbA1c:  Lab Results  Component Value Date   HGBA1C 5.7 (H) 02/19/2024   Urine Drug Screen:     Component Value Date/Time   LABOPIA NONE DETECTED 12/03/2020 1803   COCAINSCRNUR NONE DETECTED 12/03/2020 1803   LABBENZ NONE DETECTED 12/03/2020 1803   AMPHETMU NONE DETECTED 12/03/2020 1803   THCU POSITIVE (A) 12/03/2020 1803   LABBARB NONE DETECTED 12/03/2020 1803    Alcohol Level     Component Value Date/Time   ETH <10 12/03/2020 2235   INR  Lab Results  Component Value Date   INR 0.9 07/25/2023   APTT  Lab Results  Component Value Date   APTT 29 05/28/2023   Recent prior TTE (05/29/23):  1. Left ventricular ejection fraction, by estimation, is 60 to 65%. The  left ventricle has normal function. The left ventricle has no regional  wall motion abnormalities. Left ventricular diastolic parameters were  normal.   2. Right ventricular systolic function is normal. The right ventricular  size is normal.   3. The mitral valve is normal in structure. No evidence of mitral valve  regurgitation. No evidence of mitral stenosis.   4. The aortic valve is normal in structure. Aortic valve regurgitation is  not visualized. No aortic stenosis is present.   5. The inferior vena cava is normal in size with greater than 50%  respiratory variability, suggesting right atrial pressure of 3  mmHg.   Recent prior carotid ultrasound 12/05/23): Right Carotid: Velocities in the right ICA are consistent with a 1-39%  stenosis.  Left Carotid: Velocities in the left ICA are consistent with a 1-39%  stenosis.  Vertebrals: Bilateral vertebral arteries demonstrate antegrade flow.  Subclavians: Normal flow hemodynamics were seen in bilateral subclavian arteries.   ASSESSMENT  Travis Williams is a 69 y.o. male with a PMHx of stroke with residual left homonymous hemianopsia, obesity, intracranial stent, pulmonary thrombectomy and HTN who presents to the ED from home for assessment of acute onset right sided numbness, weakness  and slurred speech. Symptoms began at 3:00 PM today. He denies headache, CP, N/V and dizziness. Code Stroke called in the ED.   - Exam reveals new right sided deficits and dysarthria that are best localizable to the left MCA territory. He also has chronic left homonymous hemianopsia. NIHSS 7.  - CT head:  No acute intracranial abnormality. ASPECTS 10. Encephalomalacia in the bilateral occipital lobes, right greater than left, compatible with remote infarcts. Additional small remote infarcts in the superior right cerebellum. - CTA of head and neck: Severe right P2 PCA stenosis and occlusion versus severe stenosis of the more distal right P2/P3 PCA with poor distal opacification. Moderate left P2 PCA stenosis. Approximately 40% stenosis of the proximal right ICA relative to the distal vessel. Motion limits assessment with suspected severe proximal stenosis of the right V1 or V2 vertebral artery which is small. Patent basilar artery stent. - EKG: NSR - Impression:  - Acute onset of right sided weakness. Overall presentation best localizes to either the left basal ganglia, left thalamus, left internal capsule, a distal branch of the left MCA or the left pons.  - IV Thrombolysis: No: After discussion of risks and benefits of TNK treatment versus conservative management alone, the  patient declined treatment with TNK - EVT: No: CTA negative for LVO    RECOMMENDATIONS  - Add ASA to his outpatient Plavix  regimen and continue indefinitely, as he has failed Plavix  monotherapy.  - MRI brain - Has had a recent TTE, no need to repeat - Cardiac telemetry - HgbA1c, fasting lipid panel - PT consult, OT consult, Speech consult - Statin - Risk factor modification - Frequent neuro checks - NPO until passes stroke swallow screen  Addendum: MRI brain: Acute or early subacute infarct in the left pons. Remote infarcts in the occipital lobes and the right cerebellum. ______________________________________________________________________    Bonney SHARK, English Tomer, MD [facility]     [1] No Known Allergies [2] No current facility-administered medications for this encounter.  Current Outpatient Medications:    amLODipine  (NORVASC ) 5 MG tablet, TAKE 1 TABLET(5 MG) BY MOUTH DAILY, Disp: 30 tablet, Rfl: 11   atorvastatin  (LIPITOR) 80 MG tablet, Take 1 tablet (80 mg total) by mouth daily., Disp: 90 tablet, Rfl: 3   clopidogrel  (PLAVIX ) 75 MG tablet, Take 1 tablet (75 mg total) by mouth daily., Disp: 30 tablet, Rfl: 11   ezetimibe  (ZETIA ) 10 MG tablet, Take 1 tablet (10 mg total) by mouth daily., Disp: 30 tablet, Rfl: 3   labetalol  (NORMODYNE ) 300 MG tablet, TAKE 1 TABLET(300 MG) BY MOUTH TWICE DAILY, Disp: 180 tablet, Rfl: 1   losartan -hydrochlorothiazide  (HYZAAR) 100-25 MG tablet, TAKE 1 TABLET BY MOUTH DAILY, Disp: 30 tablet, Rfl: 11   pantoprazole  (PROTONIX ) 40 MG tablet, Take 1 tablet (40 mg total) by mouth daily., Disp: 30 tablet, Rfl: 1   sildenafil  (VIAGRA ) 50 MG tablet, Take 0.5 tablets (25 mg total) by mouth as needed for erectile dysfunction., Disp: 5 tablet, Rfl: 0  Facility-Administered Medications Ordered in Other Encounters:    sodium chloride  flush (NS) 0.9 % injection 3 mL, 3 mL, Intravenous, Q12H, Fernand Denyse LABOR, MD

## 2024-03-27 NOTE — Progress Notes (Signed)
" °   03/27/24 1826  Spiritual Encounters  Type of Visit Initial  Care provided to: Family  Referral source Code page  Reason for visit Code  OnCall Visit Yes   Chaplain responded to Code Stroke. Patient at CT. Chaplain provided support to family waiting in room.  "

## 2024-03-28 DIAGNOSIS — I639 Cerebral infarction, unspecified: Secondary | ICD-10-CM | POA: Diagnosis not present

## 2024-03-28 DIAGNOSIS — Z86718 Personal history of other venous thrombosis and embolism: Secondary | ICD-10-CM | POA: Diagnosis not present

## 2024-03-28 DIAGNOSIS — I129 Hypertensive chronic kidney disease with stage 1 through stage 4 chronic kidney disease, or unspecified chronic kidney disease: Secondary | ICD-10-CM | POA: Diagnosis present

## 2024-03-28 DIAGNOSIS — Z751 Person awaiting admission to adequate facility elsewhere: Secondary | ICD-10-CM | POA: Diagnosis not present

## 2024-03-28 DIAGNOSIS — I739 Peripheral vascular disease, unspecified: Secondary | ICD-10-CM | POA: Diagnosis present

## 2024-03-28 DIAGNOSIS — G8191 Hemiplegia, unspecified affecting right dominant side: Secondary | ICD-10-CM | POA: Diagnosis present

## 2024-03-28 DIAGNOSIS — Z23 Encounter for immunization: Secondary | ICD-10-CM | POA: Diagnosis present

## 2024-03-28 DIAGNOSIS — I10 Essential (primary) hypertension: Secondary | ICD-10-CM | POA: Diagnosis not present

## 2024-03-28 DIAGNOSIS — Z79899 Other long term (current) drug therapy: Secondary | ICD-10-CM | POA: Diagnosis not present

## 2024-03-28 DIAGNOSIS — R471 Dysarthria and anarthria: Secondary | ICD-10-CM | POA: Diagnosis present

## 2024-03-28 DIAGNOSIS — G459 Transient cerebral ischemic attack, unspecified: Secondary | ICD-10-CM | POA: Diagnosis present

## 2024-03-28 DIAGNOSIS — D508 Other iron deficiency anemias: Secondary | ICD-10-CM | POA: Diagnosis not present

## 2024-03-28 DIAGNOSIS — I2510 Atherosclerotic heart disease of native coronary artery without angina pectoris: Secondary | ICD-10-CM | POA: Diagnosis present

## 2024-03-28 DIAGNOSIS — Z6838 Body mass index (BMI) 38.0-38.9, adult: Secondary | ICD-10-CM | POA: Diagnosis not present

## 2024-03-28 DIAGNOSIS — Z7902 Long term (current) use of antithrombotics/antiplatelets: Secondary | ICD-10-CM | POA: Diagnosis not present

## 2024-03-28 DIAGNOSIS — Z87891 Personal history of nicotine dependence: Secondary | ICD-10-CM | POA: Diagnosis not present

## 2024-03-28 DIAGNOSIS — I6329 Cerebral infarction due to unspecified occlusion or stenosis of other precerebral arteries: Secondary | ICD-10-CM | POA: Diagnosis present

## 2024-03-28 DIAGNOSIS — E66812 Obesity, class 2: Secondary | ICD-10-CM | POA: Diagnosis present

## 2024-03-28 DIAGNOSIS — N1831 Chronic kidney disease, stage 3a: Secondary | ICD-10-CM | POA: Diagnosis present

## 2024-03-28 DIAGNOSIS — D509 Iron deficiency anemia, unspecified: Secondary | ICD-10-CM | POA: Diagnosis present

## 2024-03-28 DIAGNOSIS — Z8249 Family history of ischemic heart disease and other diseases of the circulatory system: Secondary | ICD-10-CM | POA: Diagnosis not present

## 2024-03-28 DIAGNOSIS — E785 Hyperlipidemia, unspecified: Secondary | ICD-10-CM | POA: Diagnosis present

## 2024-03-28 DIAGNOSIS — G4739 Other sleep apnea: Secondary | ICD-10-CM | POA: Diagnosis present

## 2024-03-28 DIAGNOSIS — Z955 Presence of coronary angioplasty implant and graft: Secondary | ICD-10-CM | POA: Diagnosis not present

## 2024-03-28 DIAGNOSIS — R29810 Facial weakness: Secondary | ICD-10-CM | POA: Diagnosis present

## 2024-03-28 DIAGNOSIS — Z86711 Personal history of pulmonary embolism: Secondary | ICD-10-CM | POA: Diagnosis not present

## 2024-03-28 DIAGNOSIS — I69398 Other sequelae of cerebral infarction: Secondary | ICD-10-CM | POA: Diagnosis not present

## 2024-03-28 DIAGNOSIS — G4731 Primary central sleep apnea: Secondary | ICD-10-CM | POA: Diagnosis present

## 2024-03-28 DIAGNOSIS — H53462 Homonymous bilateral field defects, left side: Secondary | ICD-10-CM | POA: Diagnosis present

## 2024-03-28 LAB — URINE DRUG SCREEN
Amphetamines: NEGATIVE
Barbiturates: NEGATIVE
Benzodiazepines: NEGATIVE
Cocaine: NEGATIVE
Fentanyl: NEGATIVE
Methadone Scn, Ur: NEGATIVE
Opiates: NEGATIVE
Tetrahydrocannabinol: NEGATIVE

## 2024-03-28 LAB — IRON AND TIBC
Iron: 25 ug/dL — ABNORMAL LOW (ref 45–182)
Saturation Ratios: 6 % — ABNORMAL LOW (ref 17.9–39.5)
TIBC: 386 ug/dL (ref 250–450)
UIBC: 362 ug/dL

## 2024-03-28 LAB — FERRITIN: Ferritin: 17 ng/mL — ABNORMAL LOW (ref 24–336)

## 2024-03-28 LAB — VITAMIN B12: Vitamin B-12: 968 pg/mL — ABNORMAL HIGH (ref 180–914)

## 2024-03-28 MED ORDER — PNEUMOCOCCAL 20-VAL CONJ VACC 0.5 ML IM SUSY
0.5000 mL | PREFILLED_SYRINGE | INTRAMUSCULAR | Status: AC
  Administered 2024-03-29: 0.5 mL via INTRAMUSCULAR
  Filled 2024-03-28: qty 0.5

## 2024-03-28 MED ORDER — LABETALOL HCL 200 MG PO TABS
300.0000 mg | ORAL_TABLET | Freq: Two times a day (BID) | ORAL | Status: DC
  Administered 2024-03-28 – 2024-03-30 (×5): 300 mg via ORAL
  Filled 2024-03-28 (×5): qty 1

## 2024-03-28 MED ORDER — ASPIRIN 81 MG PO TBEC
81.0000 mg | DELAYED_RELEASE_TABLET | Freq: Every day | ORAL | Status: DC
  Administered 2024-03-28 – 2024-03-30 (×3): 81 mg via ORAL
  Filled 2024-03-28 (×3): qty 1

## 2024-03-28 MED ORDER — SODIUM CHLORIDE 0.9 % IV SOLN: INTRAVENOUS | Status: DC

## 2024-03-28 NOTE — Progress Notes (Signed)
" °  Progress Note   Patient: Travis Williams FMW:[id number] DOB: 12/16/1955 DOA: 03/27/2024     0 DOS: the patient was seen and examined on 03/28/2024   Brief hospital course: Yuto Cajuste is a 69 y.o. male with medical history significant of stroke with residual left homonymous hemianopsia, obesity, intracranial stent, DVT and PE (s/p of pulmonary thrombectomy, not on AC), HTN, HLD, CAD, PVD, CKD stage IIIa, obesity, who presents with slurred speech, right-sided numbness and weakness.  Patient is seen by neurology, stroke workup including MRI of the brain showed acute or subacute infarct in left pons, CT angiogram of neck and head showed severe right P2 PCA stenosis, moderate left P2 PCA stenosis. Decision is made to treat him with aspirin  indefinitely and 21 days of Plavix .  Continue Lipitor. Patient also seen by PT/OT, recommended rehab.   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):  Dyslipidemia. Continue aspirin , Plavix  and Lipitor. Currently waiting for nursing placement.    Primary hypertension: Resume labetalol , hold off the rest of blood pressure medicine.   CAD (coronary artery disease): No chest pain -On aspirin , Plavix , Zetia , Lipitor   Chronic renal failure, stage 3a (HCC):  Appears to be at baseline.   Normocytic anemia: Hemoglobin 9.0 (10.0 on 12//25).  No active bleeding. Check iron  and B12 level.   Complex sleep apnea syndrome and dependence on bilevel positive airway pressure (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 -Ordered  BiPAP   Obesity (BMI 30-39.9): Patient has Obesity Class II, with body weight 109.3 Kg and BMI38.89  kg/m2.   - Encourage losing weight - Exercise and healthy diet         Subjective:  Still has significant weakness.  Physical Exam: Vitals:   03/27/24 2359 03/28/24 0358 03/28/24 0747 03/28/24 1139  BP: 138/67 (!) 143/75 139/70 (!) 156/76  Pulse: 65 63 63 72  Resp: 16 17 18 18   Temp: 98.5 F (36.9 C) 98.3 F (36.8 C) 98.3 F (36.8 C) 98.8 F (37.1 C)  TempSrc:  Oral Oral Oral  SpO2: 95% 96% 98% 98%  Weight:      Height:       General exam: Appears calm and comfortable  Respiratory system: Clear to auscultation. Respiratory effort normal. Cardiovascular system: S1 & S2 heard, RRR. No JVD, murmurs, rubs, gallops or clicks. No pedal edema. Gastrointestinal system: Abdomen is nondistended, soft and nontender. No organomegaly or masses felt. Normal bowel sounds heard. Central nervous system: Alert and oriented.  Right sided weakness. Extremities: Symmetric 5 x 5 power. Skin: No rashes, lesions or ulcers Psychiatry: Judgement and insight appear normal. Mood & affect appropriate.    Data Reviewed:  Reviewed MRI, CT scan, lab results  Family Communication: None  Disposition: Status is: Observation     Time spent: 35 minutes  Author: Murvin Mana, MD 03/28/2024 12:32 PM  For on call review [email or web address].    "

## 2024-03-28 NOTE — TOC CM/SW Note (Signed)
 Transition of Care [facility]) - Inpatient Brief Assessment   Patient Details  Name: Travis Williams MRN: [id number] Date of Birth: 03-02-1956  Transition of Care [facility]) CM/SW Contact:    Nathanael CHRISTELLA Ring, RN Phone Number: 03/28/2024, 8:58 AM   Clinical Narrative:   Transition of Care [facility]) Screening Note   Patient Details  Name: Travis Williams Date of Birth: 03-Sep-1955   Transition of Care [facility]) CM/SW Contact:    Nathanael CHRISTELLA Ring, RN Phone Number: 03/28/2024, 8:58 AM    Transition of Care Department [facility]) has reviewed patient and no TOC needs have been identified at this time.  If new patient transition needs arise, please place a TOC consult.    Transition of Care Asessment: Insurance and Status: Insurance coverage has been reviewed Patient has primary care physician: Yes Home environment has been reviewed: From home Prior level of function:: independent Prior/Current Home Services: No current home services Social Drivers of Health Review: SDOH reviewed no interventions necessary Readmission risk has been reviewed: Yes Transition of care needs: no transition of care needs at this time

## 2024-03-28 NOTE — Evaluation (Signed)
 Physical Therapy Evaluation Patient Details Name: Travis Williams MRN: [id number] DOB: 09/02/1955 Today's Date: 03/28/2024  History of Present Illness  Pt is a 69 yo male that presented to ED for slurred speech, R sided numbness/weakness. Imaging showed acute or early subacute infarct in L pons, remote infarcts in occipital lobes and R cerebellum.  PMH of stroke with residual left homonymous hemianopsia, obesity, intracranial stent, DVT and PE (s/p of pulmonary thrombectomy, not on AC), HTN, HLD, CAD, PVD, CKD stage IIIa.    Clinical Impression  The patient was seated EOB with OT upon PT arrival. Oriented to self but quickly became emotional during mobility, session limited. MMT revealed hip flexion, knee extension 4/5, and needed extra time to achieve testing position. decreased gross motor coordination noted. PF 3+/5. Sit <> stand from elevated bed surface CGA with RW, but needed verbal/tactile cues for R grip on RW to be successful. He ambulated ~57ft in room with RW and CGA-minA, but needs further assessment.  Overall the patient demonstrated deficits (see PT Problem List) that impede the patient's functional abilities, safety, and mobility and would benefit from skilled PT intervention. Recommend intensive skilled therapy (>3hrs day) to maximize outcomes, safety, balance, and pt function.       If plan is discharge home, recommend the following: A little help with walking and/or transfers;A little help with bathing/dressing/bathroom;Assistance with cooking/housework;Assist for transportation;Assistance with feeding;Help with stairs or ramp for entrance   Can travel by private vehicle        Equipment Recommendations Other (comment) (TBD)  Recommendations for Other Services       Functional Status Assessment Patient has had a recent decline in their functional status and demonstrates the ability to make significant improvements in function in a reasonable and predictable amount of time.      Precautions / Restrictions Precautions Precautions: Fall Restrictions Weight Bearing Restrictions Per Provider Order: No      Mobility  Bed Mobility Overal bed mobility: Needs Assistance Bed Mobility: Supine to Sit     Supine to sit: Contact guard, Used rails, HOB elevated          Transfers Overall transfer level: Needs assistance Equipment used: Rolling walker (2 wheels) Transfers: Sit to/from Stand Sit to Stand: Contact guard assist           General transfer comment: extra time, verbal/tactile cues for RUE grip on RW    Ambulation/Gait Ambulation/Gait assistance: Contact guard assist, Min assist Gait Distance (Feet): 20 Feet Assistive device: Rolling walker (2 wheels)   Gait velocity: decreased     General Gait Details: some coordination deficits of RLE noted, further distance deferred due to pt's emotional state  Stairs            Wheelchair Mobility     Tilt Bed    Modified Rankin (Stroke Patients Only)       Balance Overall balance assessment: Needs assistance Sitting-balance support: Feet supported Sitting balance-Leahy Scale: Good     Standing balance support: Bilateral upper extremity supported Standing balance-Leahy Scale: Fair                               Pertinent Vitals/Pain Pain Assessment Pain Assessment: No/denies pain    Home Living Family/patient expects to be discharged to:: Private residence Living Arrangements: Spouse/significant other Available Help at Discharge: Family;Friend(s) Type of Home: House Home Access: Stairs to enter Entrance Stairs-Rails: None Entrance Stairs-Number of Steps: 3  Home Layout: One level Home Equipment: None      Prior Function               Mobility Comments: ambulating w/o AD ADLs Comments: Able to perfrom BADLs INDly, not driving, 2/2 visual impairment     Extremity/Trunk Assessment   Upper Extremity Assessment Upper Extremity Assessment: Right  hand dominant;RUE deficits/detail RUE Deficits / Details: decreased strength, descreased sensation, decreased ROM; decreased fine and gross motor control    Lower Extremity Assessment Lower Extremity Assessment: RLE deficits/detail;LLE deficits/detail RLE Deficits / Details: hip flexion, knee extension 4/5, and needed extra time to achieve testing position. decreased gross motor coordination noted. PF 3+/5, DF 4/5 RLE Sensation: decreased proprioception RLE Coordination: decreased gross motor;decreased fine motor LLE Deficits / Details: WFLs, 4+5 strength       Communication        Cognition Arousal: Alert Behavior During Therapy: WFL for tasks assessed/performed                           PT - Cognition Comments: pt emotional throughout session Following commands: Intact       Cueing       General Comments      Exercises     Assessment/Plan    PT Assessment Patient needs continued PT services  PT Problem List Decreased strength;Decreased coordination;Decreased range of motion;Decreased activity tolerance;Decreased knowledge of use of DME;Decreased balance;Decreased mobility       PT Treatment Interventions DME instruction;Balance training;Gait training;Neuromuscular re-education;Stair training;Functional mobility training;Patient/family education;Therapeutic activities;Therapeutic exercise    PT Goals (Current goals can be found in the Care Plan section)  Acute Rehab PT Goals Patient Stated Goal: to feel better PT Goal Formulation: With patient Time For Goal Achievement: 04/11/24 Potential to Achieve Goals: Good    Frequency Min 3X/week     Co-evaluation               AM-PAC PT 6 Clicks Mobility  Outcome Measure Help needed turning from your back to your side while in a flat bed without using bedrails?: A Little Help needed moving from lying on your back to sitting on the side of a flat bed without using bedrails?: A Little Help needed  moving to and from a bed to a chair (including a wheelchair)?: A Little Help needed standing up from a chair using your arms (e.g., wheelchair or bedside chair)?: A Little Help needed to walk in hospital room?: A Little Help needed climbing 3-5 steps with a railing? : A Little 6 Click Score: 18    End of Session   Activity Tolerance: Other (comment) (limited by his emotional state but tolerated mobility well) Patient left: in chair;with call bell/phone within reach;with chair alarm set Nurse Communication: Mobility status PT Visit Diagnosis: Other abnormalities of gait and mobility (R26.89);Difficulty in walking, not elsewhere classified (R26.2);Muscle weakness (generalized) (M62.81)    Time: 1000-1014 PT Time Calculation (min) (ACUTE ONLY): 14 min   Charges:   PT Evaluation $PT Eval Low Complexity: 1 Low   PT General Charges $$ ACUTE PT VISIT: 1 Visit         Doyal Shams PT, DPT 1:05 PM,03/28/2024

## 2024-03-28 NOTE — Plan of Care (Signed)

## 2024-03-28 NOTE — Evaluation (Signed)
 Speech Language Pathology Evaluation Patient Details Name: Travis Williams MRN: [id number] DOB: [birth year] Today's Date: 03/28/2024 Time: 9149-9089 SLP Time Calculation (min) (ACUTE ONLY): 20 min  Problem List:  Patient Active Problem List   Diagnosis Date Noted   Stroke (HCC) 03/28/2024   Obesity (BMI 30-39.9) 03/27/2024   Chronic renal failure, stage 3a (HCC) 03/27/2024   Normocytic anemia 03/27/2024   Medicare annual wellness visit, subsequent 03/12/2024   Class 2 severe obesity due to excess calories with serious comorbidity and body mass index (BMI) of 38.0 to 38.9 in adult 02/19/2024   History of CVA with residual deficit 02/19/2024   Blurring of visual image of left eye 11/09/2023   Acute hypoxic respiratory failure (HCC) 05/30/2023   Acute deep vein thrombosis (DVT) of proximal vein of both lower extremities (HCC) 05/29/2023   Acute pulmonary embolism (HCC) 05/28/2023   Pulmonary emboli (HCC) 05/28/2023   PE (pulmonary thromboembolism) (HCC) 05/28/2023   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 01/10/2022   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   Loud snoring 09/14/2021   Severe sleep apnea 09/14/2021   Complex sleep apnea syndrome 09/14/2021   OSA (obstructive sleep apnea) 09/02/2021   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   Chronic intermittent hypoxia with obstructive sleep apnea 06/22/2021   Excessive daytime sleepiness 06/22/2021   PVD (peripheral vascular disease) 02/19/2021   CAD (coronary artery disease) 02/19/2021   HLD (hyperlipidemia) 02/19/2021   Diuretic-induced hypokalemia 02/19/2021   Unstable angina (HCC) 02/09/2021   Primary hypertension 12/21/2020   Ischemic stroke (HCC) 12/03/2020   Past Medical History:  Past Medical History:  Diagnosis Date   Hypertension    Stroke [facility])    Past Surgical History:  Past Surgical History:  Procedure Laterality Date   CORONARY STENT INTERVENTION N/A 02/19/2021    Procedure: CORONARY STENT INTERVENTION;  Surgeon: Florencio Cara BIRCH, MD;  Location: [facility] INVASIVE CV LAB;  Service: Cardiovascular;  Laterality: N/A;   IR CT HEAD LTD  12/03/2020   IR INTRA CRAN STENT  12/03/2020   IR PERCUTANEOUS ART THROMBECTOMY/INFUSION INTRACRANIAL INC DIAG ANGIO  12/03/2020   IR RADIOLOGIST EVAL & MGMT  02/23/2021   IR US  GUIDE VASC ACCESS RIGHT  12/03/2020   LEFT HEART CATH AND CORONARY ANGIOGRAPHY N/A 02/19/2021   Procedure: LEFT HEART CATH AND CORONARY ANGIOGRAPHY with intervention;  Surgeon: Fernand Denyse LABOR, MD;  Location: [facility] INVASIVE CV LAB;  Service: Cardiovascular;  Laterality: N/A;   PULMONARY THROMBECTOMY Bilateral 05/29/2023   Procedure: PULMONARY THROMBECTOMY;  Surgeon: Marea Selinda RAMAN, MD;  Location: [facility] INVASIVE CV LAB;  Service: Cardiovascular;  Laterality: Bilateral;   RADIOLOGY WITH ANESTHESIA N/A 12/03/2020   Procedure: IR WITH ANESTHESIA;  Surgeon: Radiologist, Medication, MD;  Location: [facility] OR;  Service: Radiology;  Laterality: N/A;   HPI:  Per H&P,  Travis Williams is a 69 y.o. male with medical history significant of stroke with residual left homonymous hemianopsia, obesity, intracranial stent, DVT and PE (s/p of pulmonary thrombectomy, not on AC), HTN, HLD, CAD, PVD, CKD stage IIIa, obesity, who presents with slurred speech, right-sided numbness and weakness. MRI 03/28/23: Acute or early subacute infarct in the left pons.  2. Remote infarcts in the occipital lobes and the right cerebellum.   Assessment / Plan / Recommendation Clinical Impression  Pt seen for cognitive linguistic/motor speech evaluation in the setting of acute CVA. Assessment consisting of pt interview and completion of dynamic assessment. Motor speech notable for low volume and imprecise  articulation. Intelligibility relatively preserved, though pt endorsed difference in speech compared to baseline. Min right facial asymmetry/weakness noted- impacting articulation precision. Independent  orientation, functional recall, expressive/receptive language, and visual processing demonstrated during assessment. Pt endorsed baseline visual deficits and identified current visual processing is baseline. Pt with emergent awareness for current deficits, requiring min verbal cues for identification and application of compensatory strategies (over-articulation, increased volume, pacing). Education shared for recommendation for follow up SLP services at next level of care and functional implications/targets for addressing current oral motor weakness. Pt reported understanding. SLP will monitor for need for additional acute services- none suspected.      SLP Assessment  SLP Recommendation/Assessment: All further Speech Language Pathology needs can be addressed in the next venue of care SLP Visit Diagnosis: Dysarthria and anarthria (R47.1)     Assistance Recommended at Discharge  Intermittent Supervision/Assistance  Functional Status Assessment Patient has had a recent decline in their functional status and demonstrates the ability to make significant improvements in function in a reasonable and predictable amount of time.        SLP Evaluation Cognition  Overall Cognitive Status: Within Functional Limits for tasks assessed Arousal/Alertness: Awake/alert Orientation Level: Oriented X4 Attention: Sustained Sustained Attention: Appears intact Memory: Appears intact Awareness: Appears intact Executive Function: Initiating Initiating: Appears intact Behaviors:  (none) Safety/Judgment: Appears intact       Comprehension  Auditory Comprehension Overall Auditory Comprehension: Appears within functional limits for tasks assessed Reading Comprehension Reading Status:  (Able to read orientation board in room)    Expression Expression Primary Mode of Expression: Verbal Verbal Expression Overall Verbal Expression: Appears within functional limits for tasks assessed Written Expression Written  Expression: Not tested   Oral / Motor  Oral Motor/Sensory Function Overall Oral Motor/Sensory Function: Mild impairment Facial ROM: Reduced right Facial Symmetry: Abnormal symmetry right Facial Strength: Reduced right Facial Sensation: Reduced right Lingual ROM: Within Functional Limits Lingual Symmetry: Within Functional Limits Lingual Strength: Within Functional Limits Velum: Within Functional Limits Mandible: Within Functional Limits Motor Speech Overall Motor Speech: Impaired Respiration: Within functional limits Phonation: Low vocal intensity Resonance: Within functional limits Articulation: Impaired Level of Impairment: Conversation Intelligibility: Intelligible Motor Planning: Within functional limits Motor Speech Errors: Aware Interfering Components:  (none) Effective Techniques: Slow rate;Increased vocal intensity;Over-articulate           Travis Colgate Clapp, MS, CCC-SLP Speech Language Pathologist Rehab Services; [facility] [phone] (ascom)   Travis Williams 03/28/2024, 11:27 AM

## 2024-03-28 NOTE — Evaluation (Signed)
 Occupational Therapy Evaluation Patient Details Name: Travis Williams MRN: [id number] DOB: 01/14/1956 Today's Date: 03/28/2024   History of Present Illness   Pt is a 69 yo male that presented to ED for slurred speech, R sided numbness/weakness. Imaging showed acute or early subacute infarct in L pons, remote infarcts in occipital lobes and R cerebellum.  PMH of stroke with residual left homonymous hemianopsia, obesity, intracranial stent, DVT and PE (s/p of pulmonary thrombectomy, not on AC), HTN, HLD, CAD, PVD, CKD stage IIIa.     Clinical Impressions Travis Williams presents with R-sided weakness, increased need for assistance with B/IADLs, impaired speech. PTA, pt was largely IND with B/IADLs and fxl mobility, not driving, living with his wife in a 1 story home, 3 STE, ambulating without AD, no recent falls hx. During today's session, pt demonstrates reduced strength, sensation, ROM, and fine and gross motor control in dominant R UE. Requires increased time and effort for bed mobility, transfers. Pt unable to open containers w/ R hand, unable to use R hand for holding utensils or cups and bringing to mouth. Pt has R facial drop and impaired clarity of speech. Pt currently is far from his baseline level of fxl mobility and ADL performance, recommend intensive rehab to promote return to baseline.   If plan is discharge home, recommend the following:   Help with stairs or ramp for entrance;Assist for transportation;Assistance with cooking/housework;A lot of help with bathing/dressing/bathroom;A lot of help with walking and/or transfers     Functional Status Assessment   Patient has had a recent decline in their functional status and demonstrates the ability to make significant improvements in function in a reasonable and predictable amount of time.     Equipment Recommendations   Other (comment) (defer to next venue of care)     Recommendations for Other Services          Precautions/Restrictions   Precautions Precautions: Fall Restrictions Weight Bearing Restrictions Per Provider Order: No     Mobility Bed Mobility Overal bed mobility: Needs Assistance Bed Mobility: Supine to Sit     Supine to sit: Contact guard, Used rails, HOB elevated     General bed mobility comments: extended time and effort, verbal and tactile cueing to transition to EOB sitting    Transfers Overall transfer level: Needs assistance Equipment used: Rolling walker (2 wheels) Transfers: Sit to/from Stand Sit to Stand: Contact guard assist           General transfer comment: extra time, verbal/tactile cues for RUE grip on RW      Balance Overall balance assessment: Needs assistance Sitting-balance support: Feet supported Sitting balance-Leahy Scale: Good     Standing balance support: Bilateral upper extremity supported, Reliant on assistive device for balance Standing balance-Leahy Scale: Fair                             ADL either performed or assessed with clinical judgement   ADL Overall ADL's : Needs assistance/impaired Eating/Feeding: Moderate assistance Eating/Feeding Details (indicate cue type and reason): Unable to open food containers w/ dominate R hand; unable to use utensil in dominate R hand   Grooming Details (indicate cue type and reason): Anticipate would be unable to perform with dominant R hand Upper Body Bathing: Minimal assistance Upper Body Bathing Details (indicate cue type and reason): Min A for assistance with R UE while donning gown           Lower  Body Dressing Details (indicate cue type and reason): Anticipate would require Mod A in sitting               General ADL Comments: Pt currently requires increased assistance for all ADLs compared to baseline     Vision   Additional Comments: residual left homonymous hemianopsia from prior CVA     Perception         Praxis         Pertinent Vitals/Pain  Pain Assessment Pain Assessment: No/denies pain     Extremity/Trunk Assessment Upper Extremity Assessment Upper Extremity Assessment: Right hand dominant;RUE deficits/detail RUE Deficits / Details: decreased strength, descreased sensation, decreased ROM; decreased fine and gross motor control   Lower Extremity Assessment Lower Extremity Assessment: RLE deficits/detail;LLE deficits/detail RLE Deficits / Details: hip flexion, knee extension 4/5, and needed extra time to achieve testing position. decreased gross motor coordination noted. PF 3+/5, DF 4/5 RLE Sensation: decreased proprioception RLE Coordination: decreased gross motor;decreased fine motor LLE Deficits / Details: WFLs, 4+5 strength       Communication     Cognition Arousal: Alert Behavior During Therapy: WFL for tasks assessed/performed               OT - Cognition Comments: Alert and oriented. Tearful throughout session, visably distressed                 Following commands: Intact       Cueing  General Comments          Exercises     Shoulder Instructions      Home Living Family/patient expects to be discharged to:: Private residence Living Arrangements: Spouse/significant other Available Help at Discharge: Family;Friend(s) Type of Home: House Home Access: Stairs to enter [employer] of Steps: 3 Entrance Stairs-Rails: None Home Layout: One level         Firefighter: Standard     Home Equipment: None          Prior Functioning/Environment               Mobility Comments: ambulating w/o AD ADLs Comments: Able to perfrom BADLs INDly, not driving, 2/2 visual impairment    OT Problem List: Decreased strength;Decreased range of motion;Decreased activity tolerance;Impaired balance (sitting and/or standing);Impaired sensation;Impaired UE functional use   OT Treatment/Interventions: Self-care/ADL training;Therapeutic exercise;Patient/family education;Energy  conservation;DME and/or AE instruction;Therapeutic activities;Balance training;Neuromuscular education      OT Goals(Current goals can be found in the care plan section)   Acute Rehab OT Goals OT Goal Formulation: With patient Time For Goal Achievement: 04/11/24 Potential to Achieve Goals: Good ADL Goals Pt Will Perform Grooming: standing;with supervision (using R hand) Pt Will Perform Lower Body Dressing: sitting/lateral leans;with supervision Pt Will Transfer to Toilet: with supervision;ambulating;regular height toilet Additional ADL Goal #1: Pt will eat meal INDly using R hand for opening all containers and for holding utensils   OT Frequency:  Min 3X/week    Co-evaluation              AM-PAC OT 6 Clicks Daily Activity     Outcome Measure Help from another person eating meals?: A Little Help from another person taking care of personal grooming?: A Little Help from another person toileting, which includes using toliet, bedpan, or urinal?: A Lot Help from another person bathing (including washing, rinsing, drying)?: A Lot Help from another person to put on and taking off regular upper body clothing?: A Little Help from another person to put on  and taking off regular lower body clothing?: A Lot 6 Click Score: 15   End of Session Equipment Utilized During Treatment: Rolling walker (2 wheels)  Activity Tolerance: Patient tolerated treatment well Patient left: in chair;with chair alarm set  OT Visit Diagnosis: Unsteadiness on feet (R26.81);Other abnormalities of gait and mobility (R26.89);Muscle weakness (generalized) (M62.81)                Time: 9066-8989 OT Time Calculation (min): 37 min Charges:  OT General Charges $OT Visit: 1 Visit OT Evaluation $OT Eval Moderate Complexity: 1 Mod OT Treatments $Self Care/Home Management : 8-22 mins Suzen Hock, PhD, MS, OTR/L 03/28/2024, 12:56 PM

## 2024-03-28 NOTE — Progress Notes (Signed)
 Inpatient Rehab Admissions Coordinator Note:   Per therapy recommendations patient was screened for CIR candidacy by Reche FORBES Lowers, PT. At this time, pt appears to be a potential candidate for CIR. I will place an order for rehab consult for full assessment, per our protocol.  Please contact me any with questions.[name] Reche Lowers, PT, DPT [phone] 03/28/2024 3:19 PM

## 2024-03-28 NOTE — TOC Progression Note (Signed)
 Transition of Care [facility]) - Progression Note    Patient Details  Name: Travis Williams MRN: [id number] Date of Birth: 11-05-1955  Transition of Care [facility]) CM/SW Contact  Nathanael CHRISTELLA Ring, RN Phone Number: 03/28/2024, 1:46 PM  Clinical Narrative:     Therapy is making recommendations for Acute Inpatient rehab, TOC will follow and see what [facility] says about accepting patient.                     Expected Discharge Plan and Services                                               Social Drivers of Health (SDOH) Interventions SDOH Screenings   Food Insecurity: No Food Insecurity (03/27/2024)  Housing: Low Risk (03/27/2024)  Transportation Needs: No Transportation Needs (03/27/2024)  Utilities: Not At Risk (03/27/2024)  Depression (PHQ2-9): Low Risk (03/12/2024)  Social Connections: Moderately Integrated (03/27/2024)  Tobacco Use: Medium Risk (03/27/2024)    Readmission Risk Interventions     No data to display

## 2024-03-28 NOTE — Hospital Course (Signed)
 Travis Williams is a 68 y.o. male with medical history significant of stroke with residual left homonymous hemianopsia, obesity, intracranial stent, DVT and PE (s/p of pulmonary thrombectomy, not on AC), HTN, HLD, CAD, PVD, CKD stage IIIa, obesity, who presents with slurred speech, right-sided numbness and weakness.  Patient is seen by neurology, stroke workup including MRI of the brain showed acute or subacute infarct in left pons, CT angiogram of neck and head showed severe right P2 PCA stenosis, moderate left P2 PCA stenosis. Decision is made to treat him with aspirin  indefinitely and 21 days of Plavix .  Continue Lipitor. Patient also seen by PT/OT, recommended rehab.

## 2024-03-29 DIAGNOSIS — D508 Other iron deficiency anemias: Secondary | ICD-10-CM

## 2024-03-29 DIAGNOSIS — I10 Essential (primary) hypertension: Secondary | ICD-10-CM | POA: Diagnosis not present

## 2024-03-29 DIAGNOSIS — D509 Iron deficiency anemia, unspecified: Secondary | ICD-10-CM | POA: Insufficient documentation

## 2024-03-29 DIAGNOSIS — I639 Cerebral infarction, unspecified: Secondary | ICD-10-CM | POA: Diagnosis not present

## 2024-03-29 MED ORDER — POLYSACCHARIDE IRON COMPLEX 150 MG PO CAPS
150.0000 mg | ORAL_CAPSULE | Freq: Every day | ORAL | Status: DC
  Administered 2024-03-29 – 2024-03-30 (×2): 150 mg via ORAL
  Filled 2024-03-29 (×2): qty 1

## 2024-03-29 NOTE — Progress Notes (Signed)
 Physical Therapy Treatment Patient Details Name: Travis Williams MRN: [id number] DOB: 03-05-1956 Today's Date: 03/29/2024   History of Present Illness Pt is a 69 yo male that presented to ED for slurred speech, R sided numbness/weakness. Imaging showed acute or early subacute infarct in L pons, remote infarcts in occipital lobes and R cerebellum.  PMH of stroke with residual left homonymous hemianopsia, obesity, intracranial stent, DVT and PE (s/p of pulmonary thrombectomy, not on AC), HTN, HLD, CAD, PVD, CKD stage IIIa.    PT Comments  Patient alert, agreeable to PT, motivated to improve. He was able to ambulate at a CGA-minA level with decreasing UE support. Exhibited decreased RLE coordination, decreased step height/length bilaterally, very decreased gait velocity. Participated in many higher level balance activities including retroambulation, coordinating stepping, head turns/sudden stops with CGA-minA and max verbal/visual cues. Overall the patient remains and excellent candidate for intensive skilled PT intervention (>3 hours a day) to maximize safety, function, and return to PLOF.     If plan is discharge home, recommend the following: A little help with walking and/or transfers;A little help with bathing/dressing/bathroom;Assistance with cooking/housework;Assist for transportation;Assistance with feeding;Help with stairs or ramp for entrance   Can travel by private vehicle        Equipment Recommendations  Other (comment) (TBD)    Recommendations for Other Services       Precautions / Restrictions Precautions Precautions: Fall Recall of Precautions/Restrictions: Intact Restrictions Weight Bearing Restrictions Per Provider Order: No     Mobility  Bed Mobility               General bed mobility comments: NT up in recliner at start/end of session    Transfers Overall transfer level: Needs assistance Equipment used: Rolling walker (2 wheels), None (handrail) Transfers: Sit  to/from Stand Sit to Stand: Contact guard assist           General transfer comment: increased effort to acheieve standing balance    Ambulation/Gait Ambulation/Gait assistance: Contact guard assist, Min assist Gait Distance (Feet):  (several bouts of 50-148ft) Assistive device: Rolling walker (2 wheels), None (handrail)   Gait velocity: very decreased     General Gait Details: decreased step length/height bilaterally, decreased trunk rotation, arm swing, occasional minA for steadying without UE support   Stairs             Wheelchair Mobility     Tilt Bed    Modified Rankin (Stroke Patients Only)       Balance Overall balance assessment: Needs assistance Sitting-balance support: Feet supported Sitting balance-Leahy Scale: Good     Standing balance support: No upper extremity supported Standing balance-Leahy Scale: Good                              Communication    Cognition Arousal: Alert Behavior During Therapy: WFL for tasks assessed/performed                             Following commands: Intact      Cueing    Exercises Other Exercises Other Exercises: ambulation with head turns, sudden stops, retroambulation. decreasing UE as progressed Other Exercises: standing hip abduction 2x10 bilaterally, standing balance exercises (FT EC, tandem stance EC, Single leg stance eyes open). SUE support and minA needed intermittently    General Comments        Pertinent Vitals/Pain Pain Assessment Pain Assessment: No/denies  pain    Home Living                          Prior Function            PT Goals (current goals can now be found in the care plan section) Progress towards PT goals: Progressing toward goals    Frequency    Min 3X/week      PT Plan      Co-evaluation              AM-PAC PT 6 Clicks Mobility   Outcome Measure  Help needed turning from your back to your side while in a flat  bed without using bedrails?: A Little Help needed moving from lying on your back to sitting on the side of a flat bed without using bedrails?: A Little Help needed moving to and from a bed to a chair (including a wheelchair)?: A Little Help needed standing up from a chair using your arms (e.g., wheelchair or bedside chair)?: A Little Help needed to walk in hospital room?: A Little Help needed climbing 3-5 steps with a railing? : A Little 6 Click Score: 18    End of Session Equipment Utilized During Treatment: Gait belt Activity Tolerance: Patient tolerated treatment well Patient left: in chair;with call bell/phone within reach;with chair alarm set;with family/visitor present Nurse Communication: Mobility status PT Visit Diagnosis: Other abnormalities of gait and mobility (R26.89);Difficulty in walking, not elsewhere classified (R26.2);Muscle weakness (generalized) (M62.81)     Time: 8664-8640 PT Time Calculation (min) (ACUTE ONLY): 24 min  Charges:    $Therapeutic Exercise: 8-22 mins $Neuromuscular Re-education: 8-22 mins PT General Charges $$ ACUTE PT VISIT: 1 Visit                     Doyal Shams PT, DPT 3:36 PM,03/29/2024

## 2024-03-29 NOTE — Consult Note (Signed)
 Physical Medicine & Rehabilitation Consult Service  Pt discussed with rehab admissions coordinator. Chart has been reviewed. This is a 69 year old male with history of DVT/PE, prior stroke with residual left homonymous hemianopsia, obesity, CKD stage III AA, CAD and PVD who presented on 03/28/2024 with slurred speech and right sided weakness and sensory loss.  MRI of the brain demonstrated an acute/subacute infarct in the left pons.  CT angiogram revealed severe right P2 PCA stenosis and moderate left P2 PCA stenosis.  The plan is to treat him with aspirin  and Plavix  for 21 days then aspirin  alone.  Patient has been up with therapies and is min assist to contact-guard assist with sit to stand and for walking 20 feet using a rolling walker.  He is min assist for ADLs.  Patient lives with his spouse in 1 level house with 3 steps to enter.  Patient was ambulating without a device prior to this hospitalization and was able to perform all self-care tasks and tasks around the house independently.  He was not driving due to his visual loss.   Home: Home Living Family/patient expects to be discharged to:: Private residence Living Arrangements: Spouse/significant other Available Help at Discharge: Family, Friend(s) Type of Home: House Home Access: Stairs to enter Secretary/administrator of Steps: 3 Entrance Stairs-Rails: None Home Layout: One level Firefighter: Standard Home Equipment: None  Functional History: Prior Function Mobility Comments: ambulating w/o AD ADLs Comments: Able to perfrom BADLs INDly, not driving, 2/2 visual impairment Functional Status:  Mobility: Bed Mobility Overal bed mobility: Needs Assistance Bed Mobility: Supine to Sit Supine to sit: Supervision, HOB elevated, Used rails General bed mobility comments: incraesd effort and time to perform, VC for WBing through RUE Transfers Overall transfer level: Needs assistance Equipment used: None Transfers: Sit to/from Stand Sit  to Stand: Min assist, Contact guard assist General transfer comment: unsteady, incr effort, incr time to achieve static standing balance Ambulation/Gait Ambulation/Gait assistance: Contact guard assist, Min assist Gait Distance (Feet): 20 Feet Assistive device: Rolling walker (2 wheels) General Gait Details: some coordination deficits of RLE noted, further distance deferred due to pt's emotional state Gait velocity: decreased    ADL: ADL Overall ADL's : Needs assistance/impaired Eating/Feeding: Sitting, Set up, With adaptive utensils, Minimal assistance Eating/Feeding Details (indicate cue type and reason): able to hold a plastic spoon grossly in his R hand, difficulty opening containers still, provided with and edu in use of built up handle as needed for feeding/grooming Grooming Details (indicate cue type and reason): Anticipate would be unable to perform with dominant R hand Upper Body Bathing: Minimal assistance Upper Body Bathing Details (indicate cue type and reason): Min A for assistance with R UE while donning gown Lower Body Dressing: Sitting/lateral leans, Minimal assistance Lower Body Dressing Details (indicate cue type and reason): MIN A for socks Functional mobility during ADLs: Contact guard assist, Minimal assistance General ADL Comments: Pt currently requires increased assistance for all ADLs compared to baseline  Cognition: Cognition Overall Cognitive Status: Within Functional Limits for tasks assessed Arousal/Alertness: Awake/alert Orientation Level: Oriented X4 Attention: Sustained Sustained Attention: Appears intact Memory: Appears intact Awareness: Appears intact Executive Function: Initiating Initiating: Appears intact Behaviors:  (none) Safety/Judgment: Appears intact Cognition Arousal: Alert Behavior During Therapy: WFL for tasks assessed/performed Overall Cognitive Status: Within Functional Limits for tasks assessed   Assessment: 69 year old male  with a left pontine infarct with right hemiparesis and hemisensory loss.  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 Additionally, the patient requires daily MD oversight of the active medical issues noted above. Projected goals would be modified independent with mobility and self-care with an ELOS of 7 days.  Dispo and social supports are appropriate.    Rehab Admissions Coordinator to follow up.    Arthea IVAR Gunther, MD, [facility] Physical Medicine & Rehabilitation Medical Director Rehabilitation Services 03/29/2024

## 2024-03-29 NOTE — Progress Notes (Signed)
 Occupational Therapy Treatment Patient Details Name: Travis Williams MRN: [id number] DOB: [birth year] Today's Date: 03/29/2024   History of present illness Pt is a 69 yo male that presented to ED for slurred speech, R sided numbness/weakness. Imaging showed acute or early subacute infarct in L pons, remote infarcts in occipital lobes and R cerebellum.  PMH of stroke with residual left homonymous hemianopsia, obesity, intracranial stent, DVT and PE (s/p of pulmonary thrombectomy, not on AC), HTN, HLD, CAD, PVD, CKD stage IIIa.   OT comments  Pt seen for OT tx. Pt eager to participate. Endorses some improvement noted in his functional RUE use. Pt completed sup>sit EOB with increased effort and CGA. VC for WBing through RUE while scooting towards the EOB. Pt demo's good static sitting balance and able to manage socks seated EOB with MIN A using BUE. Pt required CGA-MIN A for STS EOB with NO AD, and increased time to improve static standing balance prior to taking a few unsteady steps to the recliner requiring CGA-MIN A. Pt insecure and reaches out for arm rests prematurely as he approaches the recliner. Pt demo's ability to hold a plastic spoon grossly in his R hand. Still has difficulty opening containers and managing utensils. Pt provided with and edu in use of built up handle as needed for feeding/grooming. Pt instructed in yellow/soft theraputty ex/act using R hand to work on pinch strength, grip strength, composite finger extension/flexion, and finger to palm translation as well as other [facility] activities during daily functional tasks to improve strength/FMC. Pt fatigues quickly with effort but able to return demo learned ex/act. Pt continues to benefit from high intensity skilled OT services.       If plan is discharge home, recommend the following:  Help with stairs or ramp for entrance;Assist for transportation;Assistance with cooking/housework;A little help with bathing/dressing/bathroom;A little help with  walking and/or transfers   Equipment Recommendations  Other (comment) (defer)    Recommendations for Other Services      Precautions / Restrictions Precautions Precautions: Fall Recall of Precautions/Restrictions: Intact Restrictions Weight Bearing Restrictions Per Provider Order: No       Mobility Bed Mobility Overal bed mobility: Needs Assistance Bed Mobility: Supine to Sit     Supine to sit: Supervision, HOB elevated, Used rails     General bed mobility comments: incraesd effort and time to perform, VC for WBing through RUE    Transfers Overall transfer level: Needs assistance Equipment used: None Transfers: Sit to/from Stand Sit to Stand: Min assist, Contact guard assist           General transfer comment: unsteady, incr effort, incr time to achieve static standing balance     Balance Overall balance assessment: Needs assistance Sitting-balance support: Feet supported Sitting balance-Leahy Scale: Good     Standing balance support: No upper extremity supported Standing balance-Leahy Scale: Fair Standing balance comment: fair-, incr time to achieve static standing balance, poor+ with dynamic requiring CGA-MIN A        ADL either performed or assessed with clinical judgement   ADL Overall ADL's : Needs assistance/impaired Eating/Feeding: Sitting;Set up;With adaptive utensils;Minimal assistance Eating/Feeding Details (indicate cue type and reason): able to hold a plastic spoon grossly in his R hand, difficulty opening containers still, provided with and edu in use of built up handle as needed for feeding/grooming    Lower Body Dressing: Sitting/lateral leans;Minimal assistance Lower Body Dressing Details (indicate cue type and reason): MIN A for socks      Functional mobility  during ADLs: Contact guard assist;Minimal assistance        Cognition Arousal: Alert Behavior During Therapy: WFL for tasks assessed/performed Cognition: No apparent  impairments        Following commands: Intact        Cueing      Exercises Other Exercises Other Exercises: Pt edu in theraputty ex/act using R hand to work on pinch strength, grip strength, composite finger extension, and finger to palm translation as well as other FMC activities during daily functional tasks to improve strength/FMC      Frequency  Min 3X/week        Progress Toward Goals  OT Goals(current goals can now be found in the care plan section)  Progress towards OT goals: Progressing toward goals  Acute Rehab OT Goals OT Goal Formulation: With patient Time For Goal Achievement: 04/11/24 Potential to Achieve Goals: Good  Plan      Co-evaluation                 AM-PAC OT 6 Clicks Daily Activity     Outcome Measure   Help from another person eating meals?: A Little Help from another person taking care of personal grooming?: A Little Help from another person toileting, which includes using toliet, bedpan, or urinal?: A Lot Help from another person bathing (including washing, rinsing, drying)?: A Lot Help from another person to put on and taking off regular upper body clothing?: A Little Help from another person to put on and taking off regular lower body clothing?: A Lot 6 Click Score: 15    End of Session Equipment Utilized During Treatment: Gait belt  OT Visit Diagnosis: Unsteadiness on feet (R26.81);Other abnormalities of gait and mobility (R26.89);Muscle weakness (generalized) (M62.81)   Activity Tolerance Patient tolerated treatment well   Patient Left in chair;with call bell/phone within reach;with chair alarm set   Nurse Communication          Time: [phone] OT Time Calculation (min): 15 min  Charges: OT General Charges $OT Visit: 1 Visit OT Treatments $Therapeutic Activity: 8-22 mins  Warren SAUNDERS., MPH, MS, OTR/L ascom [phone] 03/29/2024, 10:11 AM

## 2024-03-29 NOTE — Progress Notes (Signed)
" °  Progress Note   Patient: Travis Williams FMW:[id number] DOB: [birth year] DOA: 03/27/2024     1 DOS: the patient was seen and examined on 03/29/2024   Brief hospital course: Travis Williams is a 69 y.o. male with medical history significant of stroke with residual left homonymous hemianopsia, obesity, intracranial stent, DVT and PE (s/p of pulmonary thrombectomy, not on AC), HTN, HLD, CAD, PVD, CKD stage IIIa, obesity, who presents with slurred speech, right-sided numbness and weakness.  Patient is seen by neurology, stroke workup including MRI of the brain showed acute or subacute infarct in left pons, CT angiogram of neck and head showed severe right P2 PCA stenosis, moderate left P2 PCA stenosis. Decision is made to treat him with aspirin  indefinitely and 21 days of Plavix .  Continue Lipitor. Patient also seen by PT/OT, recommended rehab.   Principal Problem:   Stroke [facility]) Active Problems:   Primary hypertension   HLD (hyperlipidemia)   CAD (coronary artery disease)   Chronic renal failure, stage 3a (HCC)   Normocytic anemia   Complex sleep apnea syndrome   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   Obesity (BMI 30-39.9)   TIA (transient ischemic attack)   Iron  deficiency anemia   Assessment and Plan: Stroke [facility]):  Dyslipidemia. Continue aspirin , Plavix  and Lipitor. Patient seen by PT/OT, recommended acute rehab.  Currently pending insurance authorization.    Primary hypertension: Resume labetalol , hold off the rest of blood pressure medicine.   CAD (coronary artery disease): No chest pain -On aspirin , Plavix , Zetia , Lipitor   Chronic renal failure, stage 3a (HCC):  Appears to be at baseline.   Iron  deficient anemia: Hemoglobin 9.0 (10.0 on 12//25).  No active bleeding. Start iron  supplements.  B12 level normal.   Complex sleep apnea syndrome and dependence on bilevel positive airway pressure (BPAP) ventilation due to central  sleep apnea -Ordered  BiPAP   Obesity (BMI 30-39.9): Patient has Obesity Class II, with body weight 109.3 Kg and BMI38.89  kg/m2.  - Encourage losing weight - Exercise and healthy diet        Subjective:  Patient doing better with mobility today.  Physical Exam: Vitals:   03/28/24 2010 03/29/24 0009 03/29/24 0409 03/29/24 0800  BP: (!) 145/82 138/79 138/72 139/72  Pulse: 77 77 68 65  Resp: 18 16  19   Temp: 98.3 F (36.8 C) 98.3 F (36.8 C)  97.9 F (36.6 C)  TempSrc:   Oral Oral  SpO2: 100% 97% 98% 97%  Weight:      Height:       General exam: Appears calm and comfortable  Respiratory system: Clear to auscultation. Respiratory effort normal. Cardiovascular system: S1 & S2 heard, RRR. No JVD, murmurs, rubs, gallops or clicks. No pedal edema. Gastrointestinal system: Abdomen is nondistended, soft and nontender. No organomegaly or masses felt. Normal bowel sounds heard. Central nervous system: Alert and oriented.  Right sided weakness Extremities: Symmetric 5 x 5 power. Skin: No rashes, lesions or ulcers Psychiatry: Judgement and insight appear normal. Mood & affect appropriate.    Data Reviewed:  Lab results reviewed.  Family Communication: None  Disposition: Status is: Inpatient Remains inpatient appropriate because: Unsafe discharge, pending rehab placement     Time spent: 35 minutes  Author: Murvin Mana, MD 03/29/2024 11:37 AM  For on call review [email or web address].    "

## 2024-03-29 NOTE — Progress Notes (Signed)
 Inpatient Rehab Admissions Coordinator:    I spoke with pt. And significant other to discuss potential CIR admit. They are interested. Significant other Ronal can provide 24/7 min assist. I will send his case to insurance today.   Leita Kleine, MS, CCC-SLP Rehab Admissions Coordinator  [phone] (celll) [phone] (office)

## 2024-03-29 NOTE — TOC Initial Note (Signed)
 Transition of Care [facility]) - Initial/Assessment Note    Patient Details  Name: Travis Williams MRN: [id number] Date of Birth: November 03, 1955  [facility] CM/SW Contact:    Nathanael CHRISTELLA Ring, RN Phone Number: 03/29/2024, 11:36 AM  Clinical Narrative:                 CM met with patient at the bedside, He is sitting up in the bedside chair.  He reports doing well.  He is from home with his wife, reports being independent before coming into the hospital.  He does not drive because of his eye sight but his wife does.  He is in agreement with Acute inpatient rehab at [facility], he does not want to go to [facility] or [facility] for acute inpatient rehab.   [facility] inpatient re[facility] will be coming to assess  Expected Discharge Plan: IP Rehab Facility Barriers to Discharge: Other (must enter comment) (AIR evaluation pending)   Patient Goals and CMS Choice Patient states their goals for this hospitalization and ongoing recovery are:: Agrees to AIR          Expected Discharge Plan and Services   Discharge Planning Services: CM Consult Post Acute Care Choice: IP Rehab Living arrangements for the past 2 months: Single Family Home                                      Prior Living Arrangements/Services Living arrangements for the past 2 months: Single Family Home Lives with:: Spouse Patient language and need for interpreter reviewed:: No Do you feel safe going back to the place where you live?: Yes      Need for Family Participation in Patient Care: Yes (Comment) Care giver support system in place?: Yes (comment)   Criminal Activity/Legal Involvement Pertinent to Current Situation/Hospitalization: No - Comment as needed  Activities of Daily Living   ADL Screening (condition at time of admission) Independently performs ADLs?: Yes (appropriate for developmental age) Is the patient deaf or have difficulty hearing?: No Does the patient have difficulty seeing, even when wearing  glasses/contacts?: No Does the patient have difficulty concentrating, remembering, or making decisions?: No  Permission Sought/Granted   Permission granted to share information with : Yes, Verbal Permission Granted  Share Information with NAME: Ronal Prude  Permission granted to share info w AGENCY: [facility]  Permission granted to share info w Relationship: significant other  Permission granted to share info w Contact Information: [phone]  Emotional Assessment Appearance:: Appears stated age Attitude/Demeanor/Rapport: Engaged Affect (typically observed): Accepting Orientation: : Oriented to Self, Oriented to Place, Oriented to  Time, Oriented to Situation Alcohol / Substance Use: Not Applicable Psych Involvement: No (comment)  Admission diagnosis:  TIA (transient ischemic attack) [G45.9] Dysarthria [R47.1] Right sided numbness [R20.0] Patient Active Problem List   Diagnosis Date Noted   Iron  deficiency anemia 03/29/2024   Stroke (HCC) 03/28/2024   TIA (transient ischemic attack) 03/28/2024   Obesity (BMI 30-39.9) 03/27/2024   Chronic renal failure, stage 3a (HCC) 03/27/2024   Normocytic anemia 03/27/2024   Medicare annual wellness visit, subsequent 03/12/2024   Class 2 severe obesity due to excess calories with serious comorbidity and body mass index (BMI) of 38.0 to 38.9 in adult 02/19/2024   History of CVA with residual deficit 02/19/2024   Blurring of visual image of left eye 11/09/2023   Acute hypoxic respiratory failure (HCC) 05/30/2023  Acute deep vein thrombosis (DVT) of proximal vein of both lower extremities (HCC) 05/29/2023   Acute pulmonary embolism (HCC) 05/28/2023   Pulmonary emboli (HCC) 05/28/2023   PE (pulmonary thromboembolism) (HCC) 05/28/2023   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 01/10/2022   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   Loud snoring 09/14/2021   Severe sleep apnea 09/14/2021   Complex  sleep apnea syndrome 09/14/2021   OSA (obstructive sleep apnea) 09/02/2021   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   Chronic intermittent hypoxia with obstructive sleep apnea 06/22/2021   Excessive daytime sleepiness 06/22/2021   PVD (peripheral vascular disease) 02/19/2021   CAD (coronary artery disease) 02/19/2021   HLD (hyperlipidemia) 02/19/2021   Diuretic-induced hypokalemia 02/19/2021   Unstable angina (HCC) 02/09/2021   Primary hypertension 12/21/2020   Ischemic stroke (HCC) 12/03/2020   PCP:  Fernand Fredy RAMAN, MD Pharmacy:   [id number] [place], [place] - [address] AT [facility] OF [address] [place] [address] Phone: [phone] Fax: [phone]     Social Drivers of Health (SDOH) Social History: SDOH Screenings   Food Insecurity: No Food Insecurity (03/27/2024)  Housing: Low Risk (03/27/2024)  Transportation Needs: No Transportation Needs (03/27/2024)  Utilities: Not At Risk (03/27/2024)  Depression (PHQ2-9): Low Risk (03/12/2024)  Social Connections: Moderately Integrated (03/27/2024)  Tobacco Use: Medium Risk (03/27/2024)   SDOH Interventions:     Readmission Risk Interventions     No data to display

## 2024-03-29 NOTE — PMR Pre-admission (Shared)
 PMR Admission Coordinator Pre-Admission Assessment  Patient: Travis Williams is an 69 y.o., male MRN: [id number] DOB: Nov 22, 1955 Height: 5' 6 (167.6 cm) Weight: 109.3 kg  Insurance Information HMO:  yes    PPO:      PCP:      IPA:      80/20:      OTHER:  PRIMARY: [employer] HMO      Policy#: [id number]      Subscriber: Pt CM Name: ***      Phone#:  [phone], Medicare: [id number]     Fax#: [phone]   Pre-Cert#: [id number]      Employer:  Benefits:  Phone #:      Name:  Eustacio Date: 03/21/2024 - still active   Deductible: does not have one   OOP Max: $3,950 ($0 met)   CIR: $295/day co-pay with a max co-pay of $1,475/admission (5 days)   SNF: $20/day co-pay for days 1-20, $218/day co-pay for days 21-100, limited to 100 days/cal yr   Outpatient:  $25 copay/visit Home Health:  100% coverage   DME: 80% coverage; 20% co-insurance  Providers: in network  SECONDARY:       Policy#:      Phone#:    [id number].  Clinicals uploaded at 1:19pm.  Thanks   Benefits: Coleson, Kant - DOB 25-Oct-1955   Insurance: [facility] MEDICARE HMO Plan Type: Gold Plus, group: [id number] HMO: YES   Policy #: [id number], Medicare: [id number] Fax: [phone]   Phone: n/a - online at [email or web address]   Eff Date: 03/21/2024 - still active   Deductible: does not have one   OOP Max: $3,950 ($0 met)       CIR: $295/day co-pay with a max co-pay of $1,475/admission (5 days)   SNF: $20/day co-pay for days 1-20, $218/day co-pay for days 21-100, limited to 100 days/cal yr   Outpatient:  $25 copay/visit Home Health:  100% coverage   DME: 80% coverage; 20% co-insurance   Financial Counselor:       Phone#:   [employer] for patients in Inpatient Rehabilitation Facilities with attached Privacy Act Statement-Health Care Records was provided and verbally reviewed with: Patient and Family  Emergency Contact Information Contact Information     Name Relation Home Work Mobile    [place] Significant other   [phone]      Other Contacts   None on File     Current Medical History  Patient Admitting Diagnosis: CVA History of Present Illness:  Travis Williams is a 69 y.o. male with medical history significant of stroke with residual left homonymous hemianopsia, obesity, intracranial stent, DVT and PE (s/p of pulmonary thrombectomy, not on AC), HTN, HLD, CAD, PVD, CKD stage IIIa, obesity, who presented to  [facility] on 03/27/24 with slurred speech, right-sided numbness and weakness. Pt. was found to have WBC 8.5, renal function close to baseline, temperature 99.1, blood pressure 140/62, heart rate 60-70s, RR 18, oxygen saturation 100% on room air. CT of the head showed No acute intracranial abnormality, encephalomalacia in the bilateral occipital lobes, right greater than left, compatible with remote infarcts, and additional small remote infarcts in the superior right cerebellum. Acute or early subacute infarct in the left pons. CT angiogram revealed severe right P2 PCA stenosis and moderate left P2 PCA stenosis. The plan is to treat him with aspirin  and Plavix  for 21 days then aspirin  alone.  Pt. Seen by PT/OT/SLP and they recommend CIR to assist return to PLOF.    Complete  NIHSS TOTAL: 3  Patient's medical record from [facility] has been reviewed by the rehabilitation admission coordinator and physician.  Past Medical History  Past Medical History:  Diagnosis Date   Hypertension    Stroke [facility])     Has the patient had major surgery during 100 days prior to admission? No  Family History   family history includes Hypertension in his brother.  Current Medications Current Medications[1]  Patients Current Diet:  Diet Order             Diet regular Room service appropriate? Yes; Fluid consistency: Thin  Diet effective now                   Precautions / Restrictions Precautions Precautions:  Fall Restrictions Weight Bearing Restrictions Per Provider Order: No   Has the patient had 2 or more falls or a fall with injury in the past year? Yes and No  Prior Activity Level Community (5-7x/wk): Pt. active in the community PTA  Prior Functional Level Self Care: Did the patient need help bathing, dressing, using the toilet or eating? Independent  Indoor Mobility: Did the patient need assistance with walking from room to room (with or without device)? Independent  Stairs: Did the patient need assistance with internal or external stairs (with or without device)? Independent  Functional Cognition: Did the patient need help planning regular tasks such as shopping or remembering to take medications? Independent  Patient Information Are you of Hispanic, Latino/a,or Spanish origin?: A. No, not of Hispanic, Latino/a, or Spanish origin What is your race?: B. Black or African American Do you need or want an interpreter to communicate with a doctor or health care staff?: 0. No  Patient's Response To:  Health Literacy and Transportation Is the patient able to respond to health literacy and transportation needs?: Yes Health Literacy - How often do you need to have someone help you when you read instructions, pamphlets, or other written material from your doctor or pharmacy?: Never In the past 12 months, has lack of transportation kept you from medical appointments or from getting medications?: No In the past 12 months, has lack of transportation kept you from meetings, work, or from getting things needed for daily living?: No  Home Assistive Devices / Equipment Home Equipment: None  Prior Device Use: Indicate devices/aids used by the patient prior to current illness, exacerbation or injury? None of the above  Current Functional Level Cognition  Arousal/Alertness: Awake/alert Overall Cognitive Status: Within Functional Limits for tasks assessed Orientation Level: Oriented X4 Attention:  Sustained Sustained Attention: Appears intact Memory: Appears intact Awareness: Appears intact Executive Function: Initiating Initiating: Appears intact Behaviors:  (none) Safety/Judgment: Appears intact    Extremity Assessment (includes Sensation/Coordination)  Upper Extremity Assessment: Right hand dominant, RUE deficits/detail RUE Deficits / Details: decreased strength, descreased sensation, decreased ROM; decreased fine and gross motor control  Lower Extremity Assessment: RLE deficits/detail, LLE deficits/detail RLE Deficits / Details: hip flexion, knee extension 4/5, and needed extra time to achieve testing position. decreased gross motor coordination noted. PF 3+/5, DF 4/5 RLE Sensation: decreased proprioception RLE Coordination: decreased gross motor, decreased fine motor LLE Deficits / Details: WFLs, 4+5 strength    ADLs  Overall ADL's : Needs assistance/impaired Eating/Feeding: Sitting, Set up, With adaptive utensils, Minimal assistance Eating/Feeding Details (indicate cue type and reason): able to hold a plastic spoon grossly in his R hand, difficulty opening containers still, provided with and edu in use of built up handle as needed  for feeding/grooming Grooming Details (indicate cue type and reason): Anticipate would be unable to perform with dominant R hand Upper Body Bathing: Minimal assistance Upper Body Bathing Details (indicate cue type and reason): Min A for assistance with R UE while donning gown Lower Body Dressing: Sitting/lateral leans, Minimal assistance Lower Body Dressing Details (indicate cue type and reason): MIN A for socks Functional mobility during ADLs: Contact guard assist, Minimal assistance General ADL Comments: Pt currently requires increased assistance for all ADLs compared to baseline    Mobility  Overal bed mobility: Needs Assistance Bed Mobility: Supine to Sit Supine to sit: Supervision, HOB elevated, Used rails General bed mobility comments:  incraesd effort and time to perform, VC for WBing through RUE    Transfers  Overall transfer level: Needs assistance Equipment used: None Transfers: Sit to/from Stand Sit to Stand: Min assist, Contact guard assist General transfer comment: unsteady, incr effort, incr time to achieve static standing balance    Ambulation / Gait / Stairs / Wheelchair Mobility  Ambulation/Gait Ambulation/Gait assistance: Contact guard assist, Min assist Gait Distance (Feet): 20 Feet Assistive device: Rolling walker (2 wheels) General Gait Details: some coordination deficits of RLE noted, further distance deferred due to pt's emotional state Gait velocity: decreased    Posture / Balance Balance Overall balance assessment: Needs assistance Sitting-balance support: Feet supported Sitting balance-Leahy Scale: Good Standing balance support: No upper extremity supported Standing balance-Leahy Scale: Fair Standing balance comment: fair-, incr time to achieve static standing balance, poor+ with dynamic requiring CGA-MIN A    Special considerations/life events  Special service needs ***   Previous Home Environment (from acute therapy documentation) Living Arrangements: Spouse/significant other Available Help at Discharge: Family, Friend(s) Type of Home: House Home Layout: One level Home Access: Stairs to enter Entrance Stairs-Rails: None Secretary/administrator of Steps: 3 Bathroom Toilet: Standard Home Care Services: No  Discharge Living Setting Plans for Discharge Living Setting: Patient's home Type of Home at Discharge: House Discharge Home Layout: One level Discharge Home Access: Stairs to enter Entrance Stairs-Rails: None Entrance Stairs-Number of Steps: 3 Discharge Bathroom Shower/Tub: Walk-in shower Discharge Bathroom Toilet: Standard Discharge Bathroom Accessibility: Yes How Accessible: Accessible via walker Does the patient have any problems obtaining your medications?:  No  Social/Family/Support Systems Patient Roles: Partner Contact Information: [phone] Anticipated Caregiver: Ronal Prude (significant other) Anticipated Caregiver's Contact Information: Min A Caregiver Availability: 24/7 Discharge Plan Discussed with Primary Caregiver: Yes Is Caregiver In Agreement with Plan?: Yes  Goals Patient/Family Goal for Rehab: PT/OT/SLP Min A Expected length of stay: 12-14 days Pt/Family Agrees to Admission and willing to participate: Yes Program Orientation Provided & Reviewed with Pt/Caregiver Including Roles  & Responsibilities: Yes  Decrease burden of Care through IP rehab admission: Not anticipated  Possible need for SNF placement upon discharge: Not anticipated  Patient Condition: I have reviewed medical records from [facility], spoken with CM, and patient and spouse. I met with patient at the bedside for inpatient rehabilitation assessment.  Patient will benefit from ongoing PT, OT, and SLP, can actively participate in 3 hours of therapy a day 5 days of the week, and can make measurable gains during the admission.  Patient will also benefit from the coordinated team approach during an Inpatient Acute Rehabilitation admission.  The patient will receive intensive therapy as well as Rehabilitation physician, nursing, social worker, and care management interventions.  Due to safety, skin/wound care, disease management, medication administration, pain management, and patient education the patient requires 24 hour  a day rehabilitation nursing.  The patient is currently *** with mobility and basic ADLs.  Discharge setting and therapy post discharge at home with home health is anticipated.  Patient has agreed to participate in the Acute Inpatient Rehabilitation Program and will admit {Time; today/tomorrow:10263}.  Preadmission Screen Completed By:  Leita KATHEE Kleine, 03/29/2024 12:49  PM ______________________________________________________________________   Discussed status with Dr. PIERRETTE on *** at *** and received approval for admission today.  Admission Coordinator:  Leita KATHEE Kleine, CCC-SLP, time PIERRETTEPattricia ***   Assessment/Plan: Diagnosis: *** Does the need for close, 24 hr/day Medical supervision in concert with the patient's rehab needs make it unreasonable for this patient to be served in a less intensive setting? {yes_no_potentially:3041433} Co-Morbidities requiring supervision/potential complications: *** Due to {due un:6958565}, does the patient require 24 hr/day rehab nursing? {yes_no_potentially:3041433} Does the patient require coordinated care of a physician, rehab nurse, PT, OT, and SLP to address physical and functional deficits in the context of the above medical diagnosis(es)? {yes_no_potentially:3041433} Addressing deficits in the following areas: {deficits:3041436} Can the patient actively participate in an intensive therapy program of at least 3 hrs of therapy 5 days a week? {yes_no_potentially:3041433} The potential for patient to make measurable gains while on inpatient rehab is {potential:3041437} Anticipated functional outcomes upon discharge from inpatient rehab: {functional outcomes:304600100} PT, {functional outcomes:304600100} OT, {functional outcomes:304600100} SLP Estimated rehab length of stay to reach the above functional goals is: *** Anticipated discharge destination: {anticipated dc setting:21604} 10. Overall Rehab/Functional Prognosis: {potential:3041437}   MD Signature: ***     [1]  Current Facility-Administered Medications:    acetaminophen  (TYLENOL ) tablet 650 mg, 650 mg, Oral, Q4H PRN **OR** acetaminophen  (TYLENOL ) 160 MG/5ML solution 650 mg, 650 mg, Per Tube, Q4H PRN **OR** acetaminophen  (TYLENOL ) suppository 650 mg, 650 mg, Rectal, Q4H PRN, Niu, Xilin, MD   aspirin  EC tablet 81 mg, 81 mg, Oral, Daily, Niu, Xilin, MD, 81 mg at  03/29/24 9185   atorvastatin  (LIPITOR) tablet 80 mg, 80 mg, Oral, Daily, Niu, Xilin, MD, 80 mg at 03/29/24 9185   clopidogrel  (PLAVIX ) tablet 75 mg, 75 mg, Oral, Daily, Niu, Xilin, MD, 75 mg at 03/29/24 9185   enoxaparin  (LOVENOX ) injection 55 mg, 0.5 mg/kg, Subcutaneous, Q24H, Niu, Xilin, MD, 55 mg at 03/28/24 2111   ezetimibe  (ZETIA ) tablet 10 mg, 10 mg, Oral, Daily, Niu, Xilin, MD, 10 mg at 03/29/24 9185   hydrALAZINE  (APRESOLINE ) injection 5 mg, 5 mg, Intravenous, Q2H PRN, Niu, Xilin, MD   iron  polysaccharides (NIFEREX) capsule 150 mg, 150 mg, Oral, Daily, Zhang, Dekui, MD, 150 mg at 03/29/24 1000   labetalol  (NORMODYNE ) tablet 300 mg, 300 mg, Oral, BID, Zhang, Dekui, MD, 300 mg at 03/29/24 9185   ondansetron  (ZOFRAN ) injection 4 mg, 4 mg, Intravenous, Q8H PRN, Niu, Xilin, MD   pantoprazole  (PROTONIX ) EC tablet 40 mg, 40 mg, Oral, Daily, Niu, Xilin, MD, 40 mg at 03/29/24 9185   senna-docusate (Senokot-S) tablet 1 tablet, 1 tablet, Oral, QHS PRN, Niu, Xilin, MD  Facility-Administered Medications Ordered in Other Encounters:    sodium chloride  flush (NS) 0.9 % injection 3 mL, 3 mL, Intravenous, Q12H, Fernand Denyse LABOR, MD

## 2024-03-30 ENCOUNTER — Other Ambulatory Visit: Payer: Self-pay

## 2024-03-30 DIAGNOSIS — N1831 Chronic kidney disease, stage 3a: Secondary | ICD-10-CM | POA: Diagnosis not present

## 2024-03-30 DIAGNOSIS — I10 Essential (primary) hypertension: Secondary | ICD-10-CM | POA: Diagnosis not present

## 2024-03-30 DIAGNOSIS — I639 Cerebral infarction, unspecified: Secondary | ICD-10-CM | POA: Diagnosis not present

## 2024-03-30 MED ORDER — POLYETHYLENE GLYCOL 3350 17 G PO PACK: 17.0000 g | PACK | Freq: Every day | ORAL | Status: DC | PRN

## 2024-03-30 MED ORDER — SENNOSIDES-DOCUSATE SODIUM 8.6-50 MG PO TABS
2.0000 | ORAL_TABLET | Freq: Two times a day (BID) | ORAL | 0 refills | Status: AC
  Filled 2024-03-30: qty 100, 25d supply, fill #0

## 2024-03-30 MED ORDER — SENNOSIDES-DOCUSATE SODIUM 8.6-50 MG PO TABS
2.0000 | ORAL_TABLET | Freq: Two times a day (BID) | ORAL | Status: DC
  Administered 2024-03-30: 2 via ORAL
  Filled 2024-03-30: qty 2

## 2024-03-30 MED ORDER — ASPIRIN 81 MG PO TBEC
81.0000 mg | DELAYED_RELEASE_TABLET | Freq: Every day | ORAL | 0 refills | Status: AC
  Filled 2024-03-30: qty 90, 90d supply, fill #0

## 2024-03-30 MED ORDER — FERROUS SULFATE 325 (65 FE) MG PO TABS
325.0000 mg | ORAL_TABLET | Freq: Every day | ORAL | 0 refills | Status: AC
  Filled 2024-03-30: qty 30, 30d supply, fill #0

## 2024-03-30 MED ORDER — LACTULOSE 10 GM/15ML PO SOLN: 20.0000 g | Freq: Once | ORAL | Status: DC

## 2024-03-30 NOTE — TOC Transition Note (Signed)
 Transition of Care [facility]) - Discharge Note   Patient Details  Name: Travis Williams MRN: [id number] Date of Birth: 1955-06-22  Transition of [facility]) CM/SW Contact:  Nancye Grumbine L Terriyah Westra, LCSW Phone Number: 03/30/2024, 3:15 PM   Clinical Narrative:     Discharge Summary is in. CSW spoke with patient regarding recommendations for outpatient PT. Patient agreeable to [facility] [address] for OPPT. Referral made.   No further TOC needs. Identified. TOC signing off.     Barriers to Discharge: Other (must enter comment) (AIR evaluation pending)   Patient Goals and CMS Choice Patient states their goals for this hospitalization and ongoing recovery are:: Agrees to AIR          Discharge Placement                       Discharge Plan and Services Additional resources added to the After Visit Summary for     Discharge Planning Services: CM Consult Post Acute Care Choice: IP Rehab                               Social Drivers of Health (SDOH) Interventions SDOH Screenings   Food Insecurity: No Food Insecurity (03/27/2024)  Housing: Low Risk (03/27/2024)  Transportation Needs: No Transportation Needs (03/27/2024)  Utilities: Not At Risk (03/27/2024)  Depression (PHQ2-9): Low Risk (03/12/2024)  Social Connections: Moderately Integrated (03/27/2024)  Tobacco Use: Medium Risk (03/27/2024)     Readmission Risk Interventions     No data to display

## 2024-03-30 NOTE — Progress Notes (Signed)
 Physical Therapy Treatment Patient Details Name: Travis Williams MRN: [id number] DOB: [birth year] Today's Date: 03/30/2024   History of Present Illness Pt is a 69 yo male that presented to ED for slurred speech, R sided numbness/weakness. Imaging showed acute or early subacute infarct in L pons, remote infarcts in occipital lobes and R cerebellum.  PMH of stroke with residual left homonymous hemianopsia, obesity, intracranial stent, DVT and PE (s/p of pulmonary thrombectomy, not on AC), HTN, HLD, CAD, PVD, CKD stage IIIa.    PT Comments  Pt is progressing well with basic mobility performing at an overall supervision level.  During gait, pt ambulated 200 ft with and without RW. No LOB with or without RW but more steady with RW especially when R LE becomes fatigued. Pt continues to experience limitations to mobility, demonstrating decreased activity tolerance, RLE weakness, and decreased dynamic standing balance.  PT d/c plan has been updated to reflex pt's current progress with mobility.   If plan is discharge home, recommend the following: A little help with walking and/or transfers;A little help with bathing/dressing/bathroom;Assistance with cooking/housework;Assist for transportation;Assistance with feeding;Help with stairs or ramp for entrance   Can travel by private vehicle        Equipment Recommendations   (Pt has RW)    Recommendations for Other Services       Precautions / Restrictions Precautions Precautions: Fall Recall of Precautions/Restrictions: Intact Restrictions Weight Bearing Restrictions Per Provider Order: No     Mobility  Bed Mobility               General bed mobility comments: NT up in recliner at start/end of session    Transfers Overall transfer level: Needs assistance Equipment used: Rolling walker (2 wheels), None Transfers: Sit to/from Stand, Bed to chair/wheelchair/BSC Sit to Stand: Supervision   Step pivot transfers: Supervision        General transfer comment: easier for pt to perform.    Ambulation/Gait Ambulation/Gait assistance: Contact guard assist, Supervision Gait Distance (Feet): 200 Feet Assistive device: Rolling walker (2 wheels), None Gait Pattern/deviations: Decreased stride length       General Gait Details: decreased step length/height bilaterally, decreased trunk rotation, arm swing. No LOB with or without RW but more steady with RW especially when R LE becomes fatigued.   Stairs Stairs: Yes Stairs assistance: Supervision Stair Management: Two rails, Alternating pattern Number of Stairs: 8 General stair comments: No LOB, steady.   Wheelchair Mobility     Tilt Bed    Modified Rankin (Stroke Patients Only)       Balance Overall balance assessment: Needs assistance Sitting-balance support: Feet supported Sitting balance-Leahy Scale: Normal     Standing balance support: No upper extremity supported Standing balance-Leahy Scale: Good Standing balance comment: fair-, incr time to achieve static standing balance, poor+ with dynamic requiring CGA-MIN A without UE support.                            Communication    Cognition Arousal: Alert Behavior During Therapy: WFL for tasks assessed/performed   PT - Cognitive impairments: No apparent impairments                         Following commands: Intact      Cueing    Exercises      General Comments        Pertinent Vitals/Pain Pain Assessment Pain Assessment: No/denies pain  Home Living                          Prior Function            PT Goals (current goals can now be found in the care plan section) Acute Rehab PT Goals Patient Stated Goal: to feel better PT Goal Formulation: With patient Time For Goal Achievement: 04/11/24 Potential to Achieve Goals: Good Progress towards PT goals: Progressing toward goals    Frequency    Min 3X/week      PT Plan       Co-evaluation              AM-PAC PT 6 Clicks Mobility   Outcome Measure  Help needed turning from your back to your side while in a flat bed without using bedrails?: A Little Help needed moving from lying on your back to sitting on the side of a flat bed without using bedrails?: A Little Help needed moving to and from a bed to a chair (including a wheelchair)?: A Little Help needed standing up from a chair using your arms (e.g., wheelchair or bedside chair)?: A Little Help needed to walk in hospital room?: A Little Help needed climbing 3-5 steps with a railing? : A Little 6 Click Score: 18    End of Session Equipment Utilized During Treatment: Gait belt Activity Tolerance: Patient tolerated treatment well Patient left: in chair;with call bell/phone within reach;with chair alarm set Nurse Communication: Mobility status PT Visit Diagnosis: Other abnormalities of gait and mobility (R26.89);Difficulty in walking, not elsewhere classified (R26.2);Muscle weakness (generalized) (M62.81)     Time: 8762-8742 PT Time Calculation (min) (ACUTE ONLY): 20 min  Charges:    $Therapeutic Activity: 8-22 mins PT General Charges $$ ACUTE PT VISIT: 1 Visit                     Harland Irving, PTA  03/30/2024, 2:04 PM

## 2024-03-30 NOTE — Progress Notes (Signed)
" °  Progress Note   Patient: Travis Williams FMW:[id number] DOB: Mar 13, 1956 DOA: 03/27/2024     2 DOS: the patient was seen and examined on 03/30/2024   Brief hospital course: Travis Williams is a 69 y.o. male with medical history significant of stroke with residual left homonymous hemianopsia, obesity, intracranial stent, DVT and PE (s/p of pulmonary thrombectomy, not on AC), HTN, HLD, CAD, PVD, CKD stage IIIa, obesity, who presents with slurred speech, right-sided numbness and weakness.  Patient is seen by neurology, stroke workup including MRI of the brain showed acute or subacute infarct in left pons, CT angiogram of neck and head showed severe right P2 PCA stenosis, moderate left P2 PCA stenosis. Decision is made to treat him with aspirin  indefinitely and 21 days of Plavix .  Continue Lipitor. Patient also seen by PT/OT, recommended rehab.   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 Continue aspirin , Plavix  and Lipitor. Patient seen by PT/OT, recommended acute rehab.  Currently pending insurance authorization. Condition still stable.    Primary hypertension: Resume labetalol , blood pressure mostly well-controlled.   CAD (coronary artery disease): No chest pain -On aspirin , Plavix , Zetia , Lipitor   Chronic renal failure, stage 3a (HCC):  Appears to be at baseline.   Iron  deficient anemia: Hemoglobin 9.0 (10.0 on 12//25).  No active bleeding. Start iron  supplements.  B12 level normal.   Complex sleep apnea syndrome and dependence on bilevel positive airway pressure (BPAP)  ventilation due to central sleep apnea -Ordered  BiPAP   Obesity (BMI 30-39.9): Patient has Obesity Class II, with body weight 109.3 Kg and BMI38.89  kg/m2.  - Encourage losing weight - Exercise and healthy diet        Subjective:  Patient doing well today, feels stronger.  Physical Exam: Vitals:   03/29/24 1948 03/30/24 0340 03/30/24 0740 03/30/24 1141  BP: (!) 148/82 (!) 152/81 126/63 135/74  Pulse: 75 61 63 73  Resp:    18  Temp: 98.7 F (37.1 C) 97.9 F (36.6 C) 98.3 F (36.8 C) 98.6 F (37 C)  TempSrc: Oral Oral Oral Oral  SpO2: 97% 99% 98% 98%  Weight:      Height:       General exam: Appears calm and comfortable  Respiratory system: Clear to auscultation. Respiratory effort normal. Cardiovascular system: S1 & S2 heard, RRR. No JVD, murmurs, rubs, gallops or clicks. No pedal edema. Gastrointestinal system: Abdomen is nondistended, soft and nontender. No organomegaly or masses felt. Normal bowel sounds heard. Central nervous system: Alert and oriented. No focal neurological deficits. Extremities: Symmetric 5 x 5 power. Skin: No rashes, lesions or ulcers Psychiatry: Judgement and insight appear normal. Mood & affect appropriate.    Data Reviewed:  There are no new results to review at this time.  Family Communication: None  Disposition: Status is: Inpatient Remains inpatient appropriate because: Unsafe discharge, pending acute rehab transfer     Time spent: 35 minutes  Author: Murvin Mana, MD 03/30/2024 1:53 PM  For on call review [email or web address].    "

## 2024-03-30 NOTE — Discharge Summary (Signed)
 " Physician Discharge Summary   Patient: Travis Williams MRN: [id number] DOB: 1955/04/26  Admit date:     03/27/2024  Discharge date: 03/30/2024  Discharge Physician: Murvin Mana   PCP: Travis Fredy RAMAN, MD   Recommendations at discharge:   Follow-up with PCP in 1 week. Follow-up with neurology in 1 month. 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 *  Hospital Course: Travis Williams is a 69 y.o. male with medical history significant of stroke with residual left homonymous hemianopsia, obesity, intracranial stent, DVT and PE (s/p of pulmonary thrombectomy, not on AC), HTN, HLD, CAD, PVD, CKD stage IIIa, obesity, who presents with slurred speech, right-sided numbness and weakness.  Patient is seen by neurology, stroke workup including MRI of the brain showed acute or subacute infarct in left pons, CT angiogram of neck and head showed severe right P2 PCA stenosis, moderate left P2 PCA stenosis. Decision is made to treat him with aspirin  Plavix  indefinitely .  Continue Lipitor. Patient was initially scheduled to go to acute rehab, but he has improved significantly.  PT now recommended going home with home care.  Assessment and Plan: Stroke [facility]):  Dyslipidemia. Continue aspirin , Plavix  and Lipitor. Patient condition much improved.  PT recommended home health.  Medically stable for discharge.    Primary hypertension: Will restart labetalol  and amlodipine .  May add on ARB later if blood pressure still running high.   CAD  (coronary artery disease): No chest pain -On aspirin , Plavix , Zetia , Lipitor   Chronic renal failure, stage 3a (HCC):  Appears to be at baseline.   Iron  deficient anemia: Hemoglobin 9.0 (10.0 on 12//25).  No active bleeding. Start iron  supplements.  B12 level normal.   Complex sleep apnea syndrome and dependence on bilevel positive airway pressure (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 Continue home BiPAP.   Obesity (BMI 30-39.9): Patient has Obesity Class II, with body weight 109.3 Kg and BMI38.89  kg/m2.  - Encourage losing weight - Exercise and healthy diet           Consultants: Neurology. Procedures performed: None  Disposition: Home health Diet recommendation:  Discharge Diet Orders (From admission, onward)     Start     Ordered   03/30/24 0000  Diet - low sodium heart healthy        03/30/24 1425           Cardiac diet DISCHARGE MEDICATION: Allergies as of 03/30/2024   No Known Allergies      Medication List     STOP taking these medications    losartan -hydrochlorothiazide  100-25 MG tablet Commonly known as: HYZAAR   sildenafil  50 MG tablet Commonly known as: Viagra        TAKE these medications    amLODipine  5 MG tablet Commonly known as: NORVASC  TAKE 1 TABLET(5 MG) BY MOUTH DAILY   aspirin  EC 81 MG tablet Take 1 tablet (81 mg total) by mouth daily. Swallow whole. Start taking on: March 31, 2024   atorvastatin  80 MG tablet Commonly known as: Lipitor  Take 1 tablet (80 mg total) by mouth daily.   clopidogrel  75 MG tablet Commonly known as: PLAVIX  Take 1 tablet (75 mg total) by mouth daily.   ezetimibe  10 MG tablet Commonly known as: Zetia  Take 1 tablet (10 mg total) by mouth daily.   ferrous sulfate  324 (65 Fe) MG Tbec Take 1 tablet (325 mg total) by mouth daily.   labetalol  300 MG tablet Commonly known as: NORMODYNE  TAKE 1 TABLET(300 MG) BY MOUTH TWICE DAILY   pantoprazole  40 MG tablet Commonly known as:  Protonix  Take 1 tablet (40 mg total) by mouth daily.   senna-docusate 8.6-50 MG tablet Commonly known as: Senokot-S Take 2 tablets by mouth 2 (two) times daily.        Follow-up Information     Travis Fredy RAMAN, MD Follow up in 1 week(s).   Specialty: Internal Medicine Why: hospital follow up Contact information: [address] [place] [address] [phone]         [facility] Follow up in 1 month(s).   Contact information: [address] [place]  [address] [phone]               Discharge Exam: Travis Williams   03/27/24 1810 03/27/24 2200  Weight: 107.5 kg 109.3 kg   General exam: Appears calm and comfortable  Respiratory system: Clear to auscultation. Respiratory effort normal. Cardiovascular system: S1 & S2 heard, RRR. No JVD, murmurs, rubs, gallops or clicks. No pedal edema. Gastrointestinal system: Abdomen is nondistended, soft and nontender. No organomegaly or masses felt. Normal bowel sounds heard. Central nervous system: Alert and oriented. No focal neurological deficits. Extremities: Symmetric 5 x 5 power. Skin: No rashes, lesions or ulcers Psychiatry: Judgement and insight appear normal. Mood & affect appropriate.    Condition at discharge: good  The results of significant diagnostics from this hospitalization (including imaging, microbiology, ancillary and laboratory) are listed below for reference.   Imaging Studies: MR BRAIN WO CONTRAST Result Date: 03/27/2024 EXAM: MRI Brain Without Contrast 03/27/2024 09:54:41 PM TECHNIQUE: Multiplanar multisequence MRI of the head/brain was performed without the administration of intravenous contrast. COMPARISON: CT head today.  MRI Head Jan 12, 2024 without report CLINICAL HISTORY: Neuro deficit, acute, stroke suspected FINDINGS: BRAIN AND VENTRICLES: Acute or early subacute infarct in the left pons. Remote infarcts in the occipital lobes and the right  cerebellum. Moderate scattered small T2 hyperintensities in the white matter are compatible with chronic microvascular ischemic change. No intracranial hemorrhage. No mass. No midline shift. No hydrocephalus. The sella is unremarkable. Normal flow voids. ORBITS: No significant abnormality. SINUSES AND MASTOIDS: No significant abnormality. BONES AND SOFT TISSUES: Normal marrow signal. IMPRESSION: 1. Acute or early subacute infarct in the left pons. 2. Remote infarcts in the occipital lobes and the right cerebellum. Electronically signed by: Gilmore Molt MD 03/27/2024 10:36 PM EST RP Workstation: [id number]   CT ANGIO HEAD NECK W WO CM (CODE STROKE) Result Date: 03/27/2024 EXAM: CTA Head and Neck with Intravenous Contrast. CT Head without Contrast. CLINICAL HISTORY: Neuro deficit, acute, stroke suspected TECHNIQUE: Axial CTA images of the head and neck performed with intravenous contrast. MIP reconstructed images were created and reviewed. Axial computed tomography images of the head/brain performed without intravenous contrast. Note: Per PQRS, the description of internal carotid artery percent stenosis, including 0 percent or normal exam, is based on [employer] American Symptomatic Carotid Endarterectomy Trial (NASCET) criteria. Dose reduction technique was used including one or more of the following: automated  exposure control, adjustment of mA and kV according to patient size, and/or iterative reconstruction. CONTRAST: With; COMPARISON: MRA Jan 12, 2024 FINDINGS: CTA NECK: COMMON CAROTID ARTERIES: No significant stenosis. No dissection or occlusion. INTERNAL CAROTID ARTERIES: Approximately 40% stenosis of the proximal right ICA relative to the distal vessel. No dissection or occlusion. VERTEBRAL ARTERIES: Motion limits assessment with suspected severe proximal stenosis of the right V1 or V2 vertebral artery which is small. Let vertebral artery is patent without signficant stenosis. No dissection or occlusion. CTA  HEAD: ANTERIOR CEREBRAL ARTERIES: No significant stenosis. No occlusion. No aneurysm. MIDDLE CEREBRAL ARTERIES: No significant stenosis. No occlusion. No aneurysm. POSTERIOR CEREBRAL ARTERIES: Severe right P2 PCA stenosis and occlusion versus severe stenosis of the more distal right P2/P3 PCA with poor distal opacification. Moderate left P2 PCA stenosis. No aneurysm. BASILAR ARTERY: Basilar artery stent which is patent. Limited evaluation for in-stent stenosis by CTA. No occlusion. No aneurysm. OTHER: SOFT TISSUES: No acute finding. BONES: No acute osseous abnormality. Preliminary findings discussed with Dr. Lindzen via telephone at 7:00 PM. IMPRESSION: 1. 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 2. Moderate left P2 PCA stenosis 3. Approximately 40% stenosis of the proximal right ICA relative to the distal vessel. 4. Motion limits assessment with suspected severe proximal stenosis of the right V1 or V2 vertebral artery which is small. 5. Patent basilar artery stent. Electronically signed by: Gilmore Molt MD 03/27/2024 07:21 PM EST RP Workstation: [id number]   CT HEAD CODE STROKE WO CONTRAST (LKW 0-4.5h, LVO 0-24h) Result Date: 03/27/2024 EXAM: CT HEAD WITHOUT CONTRAST 03/27/2024 06:22:20 PM TECHNIQUE: CT of the head was performed without the administration of intravenous contrast. Automated exposure control, iterative reconstruction, and/or weight based adjustment of the mA/kV was utilized to reduce the radiation dose to as low as reasonably achievable. COMPARISON: 01/12/2024 and 11/21/2023 CLINICAL HISTORY: Neuro deficit, acute, stroke suspected FINDINGS: BRAIN AND VENTRICLES: No acute hemorrhage. No evidence of acute infarct. Encephalomalacia in the bilateral occipital lobes right greater than left compatible with remote infarcts. There are additional small remote infarcts in the superior right cerebellum. Mild chronic  microvascular ischemic changes. No hydrocephalus. No extra-axial collection. No mass effect or midline shift. Atherosclerosis at the skull base. ORBITS: No acute abnormality. SINUSES: No acute abnormality. SOFT TISSUES AND SKULL: No acute soft tissue abnormality. No skull fracture. [place] stroke program early CT (ASPECT) score: Ganglionic (caudate, IC, lentiform nucleus, insula, M1-M3): 7 Supraganglionic (M4-M6): 3 Total: 10 IMPRESSION: 1. No acute intracranial abnormality. ASPECTS 10. 2. Encephalomalacia in the bilateral occipital lobes, right greater than left, compatible with remote infarcts. Additional small remote infarcts in the superior right cerebellum. 3. Findings messaged to Dr. Lindzen via the [facility] messaging system at 6:34 PM on 03/27/24. Electronically signed by: Donnice Mania MD MD 03/27/2024 06:35 PM EST RP Workstation: [id number]    Microbiology: Results for orders placed or performed during the hospital encounter of 05/28/23  Resp panel by RT-PCR (RSV, Flu A&B, Covid) Anterior Nasal Swab     Status: None   Collection Time: 05/28/23 10:18 AM   Specimen: Anterior Nasal Swab  Result Value Ref Range Status   SARS Coronavirus 2 by RT PCR NEGATIVE NEGATIVE Final    Comment: (NOTE) SARS-CoV-2 target nucleic acids are NOT DETECTED.  The SARS-CoV-2 RNA is generally detectable in upper respiratory specimens during the acute phase of infection. The lowest concentration of SARS-CoV-2 viral copies this assay can detect  is 138 copies/mL. A negative result does not preclude SARS-Cov-2 infection and should not be used as the sole basis for treatment or other patient management decisions. A negative result may occur with  improper specimen collection/handling, submission of specimen other than nasopharyngeal swab, presence of viral mutation(s) within the areas targeted by this assay, and inadequate number of viral copies(<138 copies/mL). A negative result must be combined with clinical  observations, patient history, and epidemiological information. The expected result is Negative.  Fact Sheet for Patients:  bloggercourse.com  Fact Sheet for Healthcare Providers:  seriousbroker.it  This test is no t yet approved or cleared by the [place]  FDA and  has been authorized for detection and/or diagnosis of SARS-CoV-2 by FDA under an Emergency Use Authorization (EUA). This EUA will remain  in effect (meaning this test can be used) for the duration of the COVID-19 declaration under Section 564(b)(1) of the Act, 21 U.S.C.section 360bbb-3(b)(1), unless the authorization is terminated  or revoked sooner.       Influenza A by PCR NEGATIVE NEGATIVE Final   Influenza B by PCR NEGATIVE NEGATIVE Final    Comment: (NOTE) The Xpert Xpress SARS-CoV-2/FLU/RSV plus assay is intended as an aid in the diagnosis of influenza from Nasopharyngeal swab specimens and should not be used as a sole basis for treatment. Nasal washings and aspirates are unacceptable for Xpert Xpress SARS-CoV-2/FLU/RSV testing.  Fact Sheet for Patients: bloggercourse.com  Fact Sheet for Healthcare Providers: seriousbroker.it  This test is not yet approved or cleared by the [place]  FDA and has been authorized for detection and/or diagnosis of SARS-CoV-2 by FDA under an Emergency Use Authorization (EUA). This EUA will remain in effect (meaning this test can be used) for the duration of the COVID-19 declaration under Section 564(b)(1) of the Act, 21 U.S.C. section 360bbb-3(b)(1), unless the authorization is terminated or revoked.     Resp Syncytial Virus by PCR NEGATIVE NEGATIVE Final    Comment: (NOTE) Fact Sheet for Patients: bloggercourse.com  Fact Sheet for Healthcare Providers: seriousbroker.it  This test is not yet approved or cleared by  the [place]  FDA and has been authorized for detection and/or diagnosis of SARS-CoV-2 by FDA under an Emergency Use Authorization (EUA). This EUA will remain in effect (meaning this test can be used) for the duration of the COVID-19 declaration under Section 564(b)(1) of the Act, 21 U.S.C. section 360bbb-3(b)(1), unless the authorization is terminated or revoked.  Performed at [facility], [address] [place], [place] [address]   MRSA Next Gen by PCR, Nasal     Status: None   Collection Time: 05/28/23  5:35 PM   Specimen: Nasal Mucosa; Nasal Swab  Result Value Ref Range Status   MRSA by PCR Next Gen NOT DETECTED NOT DETECTED Final    Comment: (NOTE) The GeneXpert MRSA Assay (FDA approved for NASAL specimens only), is one component of a comprehensive MRSA colonization surveillance program. It is not intended to diagnose MRSA infection nor to guide or monitor treatment for MRSA infections. Test performance is not FDA approved in patients less than 21 years old. Performed at [facility], [address] [place], [place] [address]     Labs: CBC: Recent Labs  Lab 03/27/24 1820  WBC 8.5  NEUTROABS 5.2  HGB 9.0*  HCT 29.7*  MCV 81.4  PLT 275   Basic Metabolic Panel: Recent Labs  Lab 03/27/24 1820  NA 135  K 3.7  CL 100  CO2 24  GLUCOSE 118*  BUN 16  CREATININE 1.48*  CALCIUM  8.5*   Liver Function Tests: Recent Labs  Lab 03/27/24 1820  AST 19  ALT 19  ALKPHOS 74  BILITOT 0.5  PROT 6.6  ALBUMIN 3.9   CBG: Recent Labs  Lab 03/27/24 1809  GLUCAP 109*    Discharge time spent: 35 minutes.  Signed: Murvin Mana, MD [facility] 03/30/2024 "

## 2024-03-30 NOTE — Progress Notes (Addendum)
" ° ° ° °  [facility] REFERRAL        Occupational Therapy * Physical Therapy * Speech Therapy                           DATE: 03/30/2024  PATIENT NAME: Travis Williams   PATIENT MRN: [id number]        DIAGNOSIS/DIAGNOSIS CODE:  Stroke  DATE OF DISCHARGE: 03/30/2024       PRIMARY CARE PHYSICIAN:     Fredy Bathe MD  PCP PHONE/FAX: [phone]     Dear Provider (Name: [facility] outpatient __  Fax: [phone]   I certify that I have examined this patient and that occupational/physical/speech therapy is necessary on an outpatient basis.    The patient has expressed interest in completing their recommended course of therapy at your  location.  Once a formal order from the patient's primary care physician has been obtained, please  contact him/her to schedule an appointment for evaluation at your earliest convenience.   [email or web address] ]  Physical Therapy Evaluate and Treat  [  ]  Occupational Therapy Evaluate and Treat  [  ]  Speech Therapy Evaluate and Treat         The patient's primary care physician (listed above) must furnish and be responsible for a formal order such that the recommended services may be furnished while under the primary physician's care, and that the plan of care will be established and reviewed every 30 days (or more often if condition necessitates).   "

## 2024-03-30 NOTE — Discharge Instructions (Signed)
 An outpatient Physical Therapy appointment has been made to the [facility] outpatient clinic. You should receive a call to schedule an appointment. If you do not hear from them, please give them a call at [phone].

## 2024-03-30 NOTE — Plan of Care (Signed)

## 2024-03-30 NOTE — Plan of Care (Signed)
   Problem: Education: Goal: Knowledge of disease or condition will improve Outcome: Progressing Goal: Knowledge of secondary prevention will improve (MUST DOCUMENT ALL) Outcome: Progressing Goal: Knowledge of patient specific risk factors will improve (DELETE if not current risk factor) Outcome: Progressing

## 2024-04-01 ENCOUNTER — Telehealth: Payer: Self-pay

## 2024-04-01 NOTE — Transitions of Care (Post Inpatient/ED Visit) (Signed)
 "  04/01/2024  Name: Travis Williams MRN: [id number] DOB: 09-14-1955  Today's TOC FU Call Status: Today's TOC FU Call Status:: Successful TOC FU Call Completed TOC FU Call Complete Date: 04/01/24  Patient's Name and Date of Birth confirmed. Name, DOB  Transition Care Management Follow-up Telephone Call Date of Discharge: 03/30/24 Discharge Facility: [facility]) Type of Discharge: Inpatient Admission Primary Inpatient Discharge Diagnosis:: CVA How have you been since you were released from the hospital?: Better Any questions or concerns?: No  Items Reviewed: Did you receive and understand the discharge instructions provided?: Yes Medications obtained,verified, and reconciled?: Yes (Medications Reviewed) Any new allergies since your discharge?: No Dietary orders reviewed?: Yes Type of Diet Ordered:: Lw sodium Heart Healthy Do you have support at home?: Yes People in Home [RPT]: significant other Name of Support/Comfort Primary Source: Ronal Prude  Medications Reviewed Today: Medications Reviewed Today     Reviewed by Moises Reusing, RN (Case Manager) on 04/01/24 at 1217  Med List Status: <None>   Medication Order Taking? Sig Documenting Provider Last Dose Status Informant  amLODipine  (NORVASC ) 5 MG tablet [id number] No TAKE 1 TABLET(5 MG) BY MOUTH DAILY Fernand Alter A, MD 03/27/2024 Morning Active Self  aspirin  EC 81 MG tablet [id number]  Take 1 tablet (81 mg total) by mouth daily. Swallow whole. Laurita Pillion, MD  Active   atorvastatin  (LIPITOR) 80 MG tablet [id number] No Take 1 tablet (80 mg total) by mouth daily. Fernand Fredy RAMAN, MD 03/27/2024 Morning Active Self  clopidogrel  (PLAVIX ) 75 MG tablet [id number] No Take 1 tablet (75 mg total) by mouth daily. Sethi, Pramod S, MD 03/27/2024 Morning Active Self  ezetimibe  (ZETIA ) 10 MG tablet [id number] No Take 1 tablet (10 mg total) by mouth daily. Sethi, Pramod S, MD 03/27/2024 Morning Active Self  ferrous sulfate  325  (65 FE) MG tablet [id number]  Take 1 tablet (325 mg total) by mouth daily. Laurita Pillion, MD  Active   labetalol  (NORMODYNE ) 300 MG tablet [id number] No TAKE 1 TABLET(300 MG) BY MOUTH TWICE DAILY Fernand Fredy RAMAN, MD 03/27/2024 Morning Active Self  pantoprazole  (PROTONIX ) 40 MG tablet [id number] No Take 1 tablet (40 mg total) by mouth daily. Fernand Fredy RAMAN, MD 03/27/2024 Morning Active Self  senna-docusate (SENOKOT-S) 8.6-50 MG tablet [id number]  Take 2 tablets by mouth 2 (two) times daily. Laurita Pillion, MD  Active   sodium chloride  flush (NS) 0.9 % injection 3 mL [id number]   Fernand Alter LABOR, MD  Active             Home Care and Equipment/Supplies: Were Home Health Services Ordered?: No (Outpatient rehab was ordered) Any new equipment or medical supplies ordered?: No  Functional Questionnaire: Do you need assistance with bathing/showering or dressing?: No Do you need assistance with meal preparation?: No Do you need assistance with eating?: No Do you have difficulty maintaining continence: No Do you need assistance with getting out of bed/getting out of a chair/moving?: No Do you have difficulty managing or taking your medications?: No  Follow up appointments reviewed: PCP Follow-up appointment confirmed?: No (The patient is to call and schedule due to Non-[facility] practice) MD Provider Line Number:[phone] Given: No Specialist Hospital Follow-up appointment confirmed?: No (The pateint is to call and schedule an Neurology appointment) Reason Specialist Follow-Up Not Confirmed: Patient has Specialist Provider Number and will Call for Appointment Do you need transportation to your follow-up appointment?: No Do you understand care options if your condition(s) worsen?: Yes-patient verbalized understanding  SDOH Interventions  Today    Flowsheet Row Most Recent Value  SDOH Interventions   Food Insecurity Interventions Intervention Not Indicated  Housing Interventions Intervention Not Indicated   Transportation Interventions Intervention Not Indicated  Utilities Interventions Intervention Not Indicated    Goals Addressed             This Visit's Progress    VBCI Transitions of Care (TOC) Care Plan       Problems:  Recent Hospitalization for treatment of CAD Home Health services barrier: The patient is unclear if he is to have Home Health or Outpatient Rehab and Hospital or ED Adm Risk is 89%  Goal:  Over the next 30 days, the patient will not experience hospital readmission  Interventions:   Stroke: Reviewed Importance of taking all medications as prescribed Reviewed Importance of attending all scheduled provider appointments Advised to report any changes in symptoms or exercise tolerance Screening for signs and symptoms of depression related to chronic disease state Assessed social determinant of health barriers Assessed for signs and symptoms of stroke Reviewed referrals to home health Reviewed referrals to outpatient therapy Reviewed the importance of exercise Assessed for fall status and safety in the home  Patient Self Care Activities:  Attend all scheduled provider appointments Call pharmacy for medication refills 3-7 days in advance of running out of medications Call provider office for new concerns or questions  Notify RN Care Manager of [facility] call rescheduling needs Participate in Transition of Care Program/Attend [facility] scheduled calls Perform all self care activities independently  Perform IADL's (shopping, preparing meals, housekeeping, managing finances) independently Take medications as prescribed    Plan:  Telephone follow up appointment with care management team member scheduled for:  Friday January 16th at 11:00am         Medford Balboa, BSN, RN [place]  VBCI - Population Health RN Care Manager [phone]  "

## 2024-04-01 NOTE — Patient Instructions (Signed)
 Visit Information  Thank you for taking time to visit with me today. Please don't hesitate to contact me if I can be of assistance to you before our next scheduled telephone appointment.  Our next appointment is by telephone on Friday January 16th at 11:00am  Following is a copy of your care plan:   Goals Addressed             This Visit's Progress    VBCI Transitions of Care (TOC) Care Plan       Problems:  Recent Hospitalization for treatment of CAD Home Health services barrier: The patient is unclear if he is to have Home Health or Outpatient Rehab and Hospital or ED Adm Risk is 89%  Goal:  Over the next 30 days, the patient will not experience hospital readmission  Interventions:   Stroke: Reviewed Importance of taking all medications as prescribed Reviewed Importance of attending all scheduled provider appointments Advised to report any changes in symptoms or exercise tolerance Screening for signs and symptoms of depression related to chronic disease state Assessed social determinant of health barriers Assessed for signs and symptoms of stroke Reviewed referrals to home health Reviewed referrals to outpatient therapy Reviewed the importance of exercise Assessed for fall status and safety in the home  Patient Self Care Activities:  Attend all scheduled provider appointments Call pharmacy for medication refills 3-7 days in advance of running out of medications Call provider office for new concerns or questions  Notify RN Care Manager of [facility] call rescheduling needs Participate in Transition of Care Program/Attend [facility] scheduled calls Perform all self care activities independently  Perform IADL's (shopping, preparing meals, housekeeping, managing finances) independently Take medications as prescribed    Plan:  Telephone follow up appointment with care management team member scheduled for:  Friday January 16th at 11:00am        Patient verbalizes understanding of  instructions and care plan provided today and agrees to view in MyChart. Active MyChart status and patient understanding of how to access instructions and care plan via MyChart confirmed with patient.     The patient has been provided with contact information for the care management team and has been advised to call with any health related questions or concerns.   Please call the care guide team at [phone] if you need to cancel or reschedule your appointment.   Please call the Suicide and Crisis Lifeline: 988 call the [place]  National Suicide Prevention Lifeline: (503)672-3682 or TTY: 734-027-3393 TTY 717-694-1383) to talk to a trained counselor if you are experiencing a Mental Health or Behavioral Health Crisis or need someone to talk to.  [place]

## 2024-04-05 ENCOUNTER — Other Ambulatory Visit: Payer: Self-pay

## 2024-04-05 NOTE — Transitions of Care (Post Inpatient/ED Visit) (Signed)
" Transition of Care week 2  Visit Note  04/05/2024  Name: Travis Williams MRN: [id number]          DOB: 1955/12/24  Situation: Patient enrolled in [facility] 30-day program. Visit completed with Aarit Dickenson by telephone.   Background:   Initial Transition Care Management Follow-up Telephone Call Discharge Date and Diagnosis: 03/30/24, CVA   Past Medical History:  Diagnosis Date   Hypertension    Stroke [facility])     Assessment: Patient Reported Symptoms: Cognitive Cognitive Status: Alert and oriented to person, place, and time, Normal speech and language skills      Neurological Neurological Review of Symptoms: No symptoms reported    HEENT HEENT Symptoms Reported: No symptoms reported      Cardiovascular Cardiovascular Symptoms Reported: No symptoms reported Cardiovascular Management Strategies: Medication therapy, Coping strategies, Routine screening Cardiovascular Self-Management Outcome: 4 (good)  Respiratory Respiratory Symptoms Reported: No symptoms reported Respiratory Management Strategies: BiPAP, Medication therapy, Routine screening, Coping strategies  Endocrine Endocrine Symptoms Reported: No symptoms reported Is patient diabetic?: No    Gastrointestinal Gastrointestinal Symptoms Reported: No symptoms reported      Genitourinary Genitourinary Symptoms Reported: No symptoms reported    Integumentary Integumentary Symptoms Reported: No symptoms reported    Musculoskeletal Additional Musculoskeletal Details: The patient states he feels like he is back to baseline. He does not think he needs Outpatient PT Musculoskeletal Management Strategies: Medication therapy, Exercise, Routine screening, Coping strategies      Psychosocial Psychosocial Symptoms Reported: No symptoms reported         There were no vitals filed for this visit.    Medications Reviewed Today     Reviewed by Moises Reusing, RN (Case Manager) on 04/05/24 at 1055  Med List Status: <None>    Medication Order Taking? Sig Documenting Provider Last Dose Status Informant  amLODipine  (NORVASC ) 5 MG tablet [id number] No TAKE 1 TABLET(5 MG) BY MOUTH DAILY Fernand Alter A, MD 03/27/2024 Morning Active Self  aspirin  EC 81 MG tablet [id number]  Take 1 tablet (81 mg total) by mouth daily. Swallow whole. Laurita Pillion, MD  Active   atorvastatin  (LIPITOR) 80 MG tablet [id number] No Take 1 tablet (80 mg total) by mouth daily. Fernand Fredy RAMAN, MD 03/27/2024 Morning Active Self  clopidogrel  (PLAVIX ) 75 MG tablet [id number] No Take 1 tablet (75 mg total) by mouth daily. Sethi, Pramod S, MD 03/27/2024 Morning Active Self  ezetimibe  (ZETIA ) 10 MG tablet [id number] No Take 1 tablet (10 mg total) by mouth daily. Sethi, Pramod S, MD 03/27/2024 Morning Active Self  ferrous sulfate  325 (65 FE) MG tablet [id number]  Take 1 tablet (325 mg total) by mouth daily. Laurita Pillion, MD  Active   labetalol  (NORMODYNE ) 300 MG tablet [id number] No TAKE 1 TABLET(300 MG) BY MOUTH TWICE DAILY Fernand Fredy RAMAN, MD 03/27/2024 Morning Active Self  pantoprazole  (PROTONIX ) 40 MG tablet [id number] No Take 1 tablet (40 mg total) by mouth daily. Fernand Fredy RAMAN, MD 03/27/2024 Morning Active Self  senna-docusate (SENOKOT-S) 8.6-50 MG tablet [id number]  Take 2 tablets by mouth 2 (two) times daily. Laurita Pillion, MD  Active   sodium chloride  flush (NS) 0.9 % injection 3 mL [id number]   Fernand Alter LABOR, MD  Active             Recommendation:   Continue Current Plan of Care  Follow Up Plan:   Telephone follow-up in 1 week  Medford Moises, BSN, RN [place]  VBCI - Population Health RN Care Manager  [phone] ° ° ° ° °"

## 2024-04-05 NOTE — Patient Instructions (Signed)
 Visit Information  Thank you for taking time to visit with me today. Please don't hesitate to contact me if I can be of assistance to you before our next scheduled telephone appointment.  Following is a copy of your care plan:   Goals Addressed             This Visit's Progress    VBCI Transitions of Care (TOC) Care Plan       Problems: (reviewed 04/05/24) Recent Hospitalization for treatment of CAD Home Health services barrier: The patient is unclear if he is to have Home Health or Outpatient Rehab and Hospital or ED Adm Risk is 89%  Goal: (reviewed 04/05/24) Over the next 30 days, the patient will not experience hospital readmission  Interventions: (reviewed 04/05/24)  Stroke: Reviewed Importance of taking all medications as prescribed Reviewed Importance of attending all scheduled provider appointments Advised to report any changes in symptoms or exercise tolerance Screening for signs and symptoms of depression related to chronic disease state Assessed social determinant of health barriers Assessed for signs and symptoms of stroke Reviewed referrals to home health Reviewed referrals to outpatient therapy Reviewed the importance of exercise Assessed for fall status and safety in the home 04/05/24 - The patient states he contacted his PCP office for an appointment (non-[facility]) and has not heard back for his Hospital F/U. CM to call   Patient Self Care Activities: (reviewed 04/05/24) Attend all scheduled provider appointments Call pharmacy for medication refills 3-7 days in advance of running out of medications Call provider office for new concerns or questions  Notify RN Care Manager of [facility] call rescheduling needs Participate in Transition of Care Program/Attend [facility] scheduled calls Perform all self care activities independently  Perform IADL's (shopping, preparing meals, housekeeping, managing finances) independently Take medications as prescribed    Plan:  Telephone follow up  appointment with care management team member scheduled for:  Friday January 23rd at 1:30pm        The patient verbalized understanding of instructions, educational materials, and care plan provided today and agreed to receive a mailed copy of patient instructions, educational materials, and care plan.   Telephone follow up appointment with care management team member scheduled for: Face to Face appointment with care management team member scheduled for:   Please call the care guide team at [phone] if you need to cancel or reschedule your appointment.   Please call the Suicide and Crisis Lifeline: 988 call the [place]  National Suicide Prevention Lifeline: 838-657-3311 or TTY: 646 815 1659 TTY 7604825890) to talk to a trained counselor if you are experiencing a Mental Health or Behavioral Health Crisis or need someone to talk to.  Medford Balboa, BSN, RN [place]  VBCI - [employer] Health RN Care Manager [phone]

## 2024-04-12 ENCOUNTER — Other Ambulatory Visit: Payer: Self-pay

## 2024-04-12 NOTE — Patient Instructions (Signed)
 Visit Information  Thank you for taking time to visit with me today. Please don't hesitate to contact me if I can be of assistance to you before our next scheduled telephone appointment.  Our next appointment is by telephone on Friday January 30th at 11:30am  Following is a copy of your care plan:   Goals Addressed             This Visit's Progress    VBCI Transitions of Care (TOC) Care Plan       Problems: (reviewed 04/12/24) Recent Hospitalization for treatment of CAD Home Health services barrier: The patient is unclear if he is to have Home Health or Outpatient Rehab and Hospital or ED Adm Risk is 89%  Goal: (reviewed 04/12/24) Over the next 30 days, the patient will not experience hospital readmission  Interventions: (reviewed 04/12/24)  Stroke: Reviewed Importance of taking all medications as prescribed Reviewed Importance of attending all scheduled provider appointments Advised to report any changes in symptoms or exercise tolerance Screening for signs and symptoms of depression related to chronic disease state Assessed social determinant of health barriers Assessed for signs and symptoms of stroke Reviewed referrals to home health Reviewed referrals to outpatient therapy Reviewed the importance of exercise Assessed for fall status and safety in the home 04/05/24 - The patient states he contacted his PCP office for an appointment (non-[facility]) and has not heard back for his Hospital F/U. CM to call  04/12/24 - CM called [facility] and they called the patient to set up a hospital follow up appointment for 04/18/24 with Jeoffrey Scoggins  Patient Self Care Activities: (reviewed 04/12/24) Attend all scheduled provider appointments Call pharmacy for medication refills 3-7 days in advance of running out of medications Call provider office for new concerns or questions  Notify RN Care Manager of [facility] call rescheduling needs Participate in Transition of Care Program/Attend [facility] scheduled  calls Perform all self care activities independently  Perform IADL's (shopping, preparing meals, housekeeping, managing finances) independently Take medications as prescribed    Plan:  Telephone follow up appointment with care management team member scheduled for:  Friday January 30th at 11:30am        The patient verbalized understanding of instructions, educational materials, and care plan provided today and agreed to receive a mailed copy of patient instructions, educational materials, and care plan.   The patient has been provided with contact information for the care management team and has been advised to call with any health related questions or concerns.   Please call the care guide team at [phone] if you need to cancel or reschedule your appointment.   Please call the Suicide and Crisis Lifeline: 988 call the [place]  National Suicide Prevention Lifeline: 573-629-3536 or TTY: 313-259-8839 TTY 602-636-9409) to talk to a trained counselor if you are experiencing a Mental Health or Behavioral Health Crisis or need someone to talk to.  Medford Balboa, BSN, RN [place]  VBCI - [employer] Health RN Care Manager [phone]

## 2024-04-12 NOTE — Transitions of Care (Post Inpatient/ED Visit) (Signed)
" °  Transition of Care week 3  Visit Note  04/12/2024  Name: Travis Williams MRN: [id number]          DOB: 01-30-1956  Situation: Patient enrolled in [facility] 30-day program. Visit completed with Hernando Dubeau by telephone.   Background:   Initial Transition Care Management Follow-up Telephone Call Discharge Date and Diagnosis: 03/30/24, CVA   Past Medical History:  Diagnosis Date   Hypertension    Stroke [facility])     Assessment: Patient Reported Symptoms: Cognitive Cognitive Status: Alert and oriented to person, place, and time, Normal speech and language skills      Neurological Neurological Review of Symptoms: No symptoms reported    HEENT HEENT Symptoms Reported: No symptoms reported      Cardiovascular Cardiovascular Symptoms Reported: No symptoms reported Does patient have uncontrolled Hypertension?: No Cardiovascular Management Strategies: Medication therapy, Coping strategies  Respiratory Respiratory Symptoms Reported: No symptoms reported Respiratory Management Strategies: BiPAP, Medication therapy, Routine screening, Coping strategies  Endocrine Endocrine Symptoms Reported: No symptoms reported    Gastrointestinal Gastrointestinal Symptoms Reported: No symptoms reported      Genitourinary Genitourinary Symptoms Reported: No symptoms reported    Integumentary Integumentary Symptoms Reported: No symptoms reported    Musculoskeletal Musculoskelatal Symptoms Reviewed: No symptoms reported Musculoskeletal Management Strategies: Activity, Routine screening Musculoskeletal Comment: The patient states he does not need PT      Psychosocial Psychosocial Symptoms Reported: No symptoms reported         There were no vitals filed for this visit.    Medications Reviewed Today     Reviewed by Moises Reusing, RN (Case Manager) on 04/12/24 at 1336  Med List Status: <None>   Medication Order Taking? Sig Documenting Provider Last Dose Status Informant  amLODipine  (NORVASC ) 5  MG tablet [id number] No TAKE 1 TABLET(5 MG) BY MOUTH DAILY Fernand Alter A, MD 03/27/2024 Morning Active Self  aspirin  EC 81 MG tablet [id number]  Take 1 tablet (81 mg total) by mouth daily. Swallow whole. Laurita Pillion, MD  Active   atorvastatin  (LIPITOR) 80 MG tablet [id number] No Take 1 tablet (80 mg total) by mouth daily. Fernand Fredy RAMAN, MD 03/27/2024 Morning Active Self  clopidogrel  (PLAVIX ) 75 MG tablet [id number] No Take 1 tablet (75 mg total) by mouth daily. Sethi, Pramod S, MD 03/27/2024 Morning Active Self  ezetimibe  (ZETIA ) 10 MG tablet [id number] No Take 1 tablet (10 mg total) by mouth daily. Sethi, Pramod S, MD 03/27/2024 Morning Active Self  ferrous sulfate  325 (65 FE) MG tablet [id number]  Take 1 tablet (325 mg total) by mouth daily. Laurita Pillion, MD  Active   labetalol  (NORMODYNE ) 300 MG tablet [id number] No TAKE 1 TABLET(300 MG) BY MOUTH TWICE DAILY Fernand Fredy RAMAN, MD 03/27/2024 Morning Active Self  pantoprazole  (PROTONIX ) 40 MG tablet [id number] No Take 1 tablet (40 mg total) by mouth daily. Fernand Fredy RAMAN, MD 03/27/2024 Morning Active Self  senna-docusate (SENOKOT-S) 8.6-50 MG tablet [id number]  Take 2 tablets by mouth 2 (two) times daily. Laurita Pillion, MD  Active   sodium chloride  flush (NS) 0.9 % injection 3 mL [id number]   Fernand Alter LABOR, MD  Active             Recommendation:   Continue Current Plan of Care  Follow Up Plan:   Telephone follow-up in 1 week  Medford Moises, BSN, RN [place]  VBCI - [facility] RN Care Manager [phone]     "

## 2024-04-18 ENCOUNTER — Encounter: Payer: Self-pay | Admitting: Cardiology

## 2024-04-18 ENCOUNTER — Ambulatory Visit (INDEPENDENT_AMBULATORY_CARE_PROVIDER_SITE_OTHER): Admitting: Cardiology

## 2024-04-18 VITALS — BP 130/78 | HR 85 | Ht 66.0 in | Wt 242.0 lb

## 2024-04-18 DIAGNOSIS — I639 Cerebral infarction, unspecified: Secondary | ICD-10-CM | POA: Diagnosis not present

## 2024-04-18 DIAGNOSIS — E785 Hyperlipidemia, unspecified: Secondary | ICD-10-CM | POA: Diagnosis not present

## 2024-04-18 DIAGNOSIS — D508 Other iron deficiency anemias: Secondary | ICD-10-CM

## 2024-04-18 DIAGNOSIS — I10 Essential (primary) hypertension: Secondary | ICD-10-CM

## 2024-04-18 DIAGNOSIS — N1831 Chronic kidney disease, stage 3a: Secondary | ICD-10-CM | POA: Diagnosis not present

## 2024-04-18 DIAGNOSIS — E669 Obesity, unspecified: Secondary | ICD-10-CM | POA: Diagnosis not present

## 2024-04-18 NOTE — Patient Instructions (Signed)
 [facility] Follow up in 1 month(s).   Contact information: [address] [place]  [phone]

## 2024-04-18 NOTE — Progress Notes (Signed)
 "  Established Patient Office Visit  Subjective:  Patient ID: Travis Williams, male    DOB: [birth year]  Age: 68 y.o. MRN: [id number]  Chief Complaint  Patient presents with   Follow-up    Hospital follow up    Patient in office for hospital follow up. Patient with a PMH of CVA, went to the ED on 03/27/2024 with complaints of acute onset of slurred speech and right-sided numbness. Patient seen by neurology, stroke workup including MRI of the brain showed acute or subacute infarct in left pons, CT angiogram of neck and head showed severe right P2 PCA stenosis, moderate left P2 PCA stenosis. Decision was made to treat him with aspirin  and Plavix  indefinitely. Continue Lipitor. Patient was initially scheduled to go to acute rehab, but he had improved significantly.  Patient now recommended going home with home care. Patient comes in today doing well, states he is back to doing his same activities as prior to recent hospitalization. Blood pressure controlled today. Patient started on iron  supplement about a week ago for IDA. Continue current medications.     No other concerns at this time.   Past Medical History:  Diagnosis Date   Hypertension    Stroke [facility])     Past Surgical History:  Procedure Laterality Date   CORONARY STENT INTERVENTION N/A 02/19/2021   Procedure: CORONARY STENT INTERVENTION;  Surgeon: Florencio Cara BIRCH, MD;  Location: [facility] INVASIVE CV LAB;  Service: Cardiovascular;  Laterality: N/A;   IR CT HEAD LTD  12/03/2020   IR INTRA CRAN STENT  12/03/2020   IR PERCUTANEOUS ART THROMBECTOMY/INFUSION INTRACRANIAL INC DIAG ANGIO  12/03/2020   IR RADIOLOGIST EVAL & MGMT  02/23/2021   IR US  GUIDE VASC ACCESS RIGHT  12/03/2020   LEFT HEART CATH AND CORONARY ANGIOGRAPHY N/A 02/19/2021   Procedure: LEFT HEART CATH AND CORONARY ANGIOGRAPHY with intervention;  Surgeon: Fernand Denyse LABOR, MD;  Location: [facility] INVASIVE CV LAB;  Service: Cardiovascular;  Laterality: N/A;   PULMONARY THROMBECTOMY  Bilateral 05/29/2023   Procedure: PULMONARY THROMBECTOMY;  Surgeon: Marea Selinda RAMAN, MD;  Location: [facility] INVASIVE CV LAB;  Service: Cardiovascular;  Laterality: Bilateral;   RADIOLOGY WITH ANESTHESIA N/A 12/03/2020   Procedure: IR WITH ANESTHESIA;  Surgeon: Radiologist, Medication, MD;  Location: [facility] OR;  Service: Radiology;  Laterality: N/A;    Social History   Socioeconomic History   Marital status: Single    Spouse name: Not on file   Number of children: 2   Years of education: Not on file   Highest education level: High school graduate  Occupational History   Not on file  Tobacco Use   Smoking status: Former    Current packs/day: 0.00    Average packs/day: 1 pack/day for 5.0 years (5.0 ttl pk-yrs)    Types: Cigarettes    Start date: 77    Quit date: 77    Years since quitting: 29.0   Smokeless tobacco: Never  Vaping Use   Vaping status: Never Used  Substance and Sexual Activity   Alcohol use: Not Currently   Drug use: Never   Sexual activity: Not on file  Other Topics Concern   Not on file  Social History Narrative   Lives w roommate friend   Right handed   Caffeine: energy drink once a week   Social Drivers of Health   Tobacco Use: Medium Risk (04/18/2024)   Patient History    Smoking Tobacco Use: Former    Smokeless Tobacco Use: Never  Passive Exposure: Not on file  Financial Resource Strain: Not on file  Food Insecurity: No Food Insecurity (04/01/2024)   Epic    Worried About Programme Researcher, Broadcasting/film/video in the Last Year: Never true    Ran Out of Food in the Last Year: Never true  Transportation Needs: No Transportation Needs (04/01/2024)   Epic    Lack of Transportation (Medical): No    Lack of Transportation (Non-Medical): No  Physical Activity: Not on file  Stress: Not on file  Social Connections: Moderately Integrated (03/27/2024)   Social Connection and Isolation Panel    Frequency of Communication with Friends and Family: More than three times a week     Frequency of Social Gatherings with Friends and Family: More than three times a week    Attends Religious Services: 1 to 4 times per year    Active Member of [employer] or Organizations: No    Attends Banker Meetings: Never    Marital Status: Married  Catering Manager Violence: Not At Risk (04/01/2024)   Epic    Fear of Current or Ex-Partner: No    Emotionally Abused: No    Physically Abused: No    Sexually Abused: No  Depression (PHQ2-9): Low Risk (03/12/2024)   Depression (PHQ2-9)    PHQ-2 Score: 3  Alcohol Screen: Not on file  Housing: Unknown (04/01/2024)   Epic    Unable to Pay for Housing in the Last Year: No    Number of Times Moved in the Last Year: Not on file    Homeless in the Last Year: No  Utilities: Not At Risk (04/01/2024)   Epic    Threatened with loss of utilities: No  Health Literacy: Not on file    Family History  Problem Relation Age of Onset   Hypertension Brother     Allergies[1]  Show/hide medication list[2]  Review of Systems  Constitutional: Negative.   HENT: Negative.    Eyes: Negative.   Respiratory: Negative.  Negative for shortness of breath.   Cardiovascular: Negative.  Negative for chest pain.  Gastrointestinal: Negative.  Negative for abdominal pain, constipation and diarrhea.  Genitourinary: Negative.   Musculoskeletal:  Negative for joint pain and myalgias.  Skin: Negative.   Neurological: Negative.  Negative for dizziness and headaches.  Endo/Heme/Allergies: Negative.   All other systems reviewed and are negative.      Objective:   BP 130/78   Pulse 85   Ht 5' 6 (1.676 m)   Wt 242 lb (109.8 kg)   SpO2 95%   BMI 39.06 kg/m   Vitals:   04/18/24 1013  BP: 130/78  Pulse: 85  Height: 5' 6 (1.676 m)  Weight: 242 lb (109.8 kg)  SpO2: 95%  BMI (Calculated): 39.08    Physical Exam Nursing note reviewed.  Constitutional:      Appearance: Normal appearance. He is normal weight.  HENT:     Head: Normocephalic  and atraumatic.     Nose: Nose normal.     Mouth/Throat:     Mouth: Mucous membranes are moist.     Pharynx: Oropharynx is clear.  Eyes:     Extraocular Movements: Extraocular movements intact.     Conjunctiva/sclera: Conjunctivae normal.     Pupils: Pupils are equal, round, and reactive to light.  Cardiovascular:     Rate and Rhythm: Normal rate and regular rhythm.     Pulses: Normal pulses.     Heart sounds: Normal heart sounds.  Pulmonary:  Effort: Pulmonary effort is normal.     Breath sounds: Normal breath sounds.  Abdominal:     General: Abdomen is flat. Bowel sounds are normal.     Palpations: Abdomen is soft.  Musculoskeletal:        General: Normal range of motion.     Cervical back: Normal range of motion.  Skin:    General: Skin is warm and dry.  Neurological:     General: No focal deficit present.     Mental Status: He is alert and oriented to person, place, and time.  Psychiatric:        Mood and Affect: Mood normal.        Behavior: Behavior normal.        Thought Content: Thought content normal.        Judgment: Judgment normal.      No results found for any visits on 04/18/24.  Recent Results (from the past 2160 hours)  CMP14+EGFR     Status: Abnormal   Collection Time: 02/19/24 12:07 PM  Result Value Ref Range   Glucose 110 (H) 70 - 99 mg/dL   BUN 16 8 - 27 mg/dL   Creatinine, Ser 8.33 (H) 0.76 - 1.27 mg/dL   eGFR 44 (L) >40 fO/fpw/8.26   BUN/Creatinine Ratio 10 10 - 24   Sodium 137 134 - 144 mmol/L   Potassium 3.9 3.5 - 5.2 mmol/L   Chloride 98 96 - 106 mmol/L   CO2 25 20 - 29 mmol/L   Calcium  9.5 8.6 - 10.2 mg/dL   Total Protein 7.2 6.0 - 8.5 g/dL   Albumin 4.2 3.9 - 4.9 g/dL   Globulin, Total 3.0 1.5 - 4.5 g/dL   Bilirubin Total 0.9 0.0 - 1.2 mg/dL   Alkaline Phosphatase 87 47 - 123 IU/L   AST 15 0 - 40 IU/L   ALT 18 0 - 44 IU/L  Lipid Panel w/o Chol/HDL Ratio     Status: None   Collection Time: 02/19/24 12:07 PM  Result Value Ref  Range   Cholesterol, Total 129 100 - 199 mg/dL   Triglycerides 880 0 - 149 mg/dL   HDL 41 >60 mg/dL   VLDL Cholesterol Cal 22 5 - 40 mg/dL   LDL Chol Calc (NIH) 66 0 - 99 mg/dL  CBC with Diff     Status: Abnormal   Collection Time: 02/19/24 12:07 PM  Result Value Ref Range   WBC 6.8 3.4 - 10.8 x10E3/uL   RBC 3.89 (L) 4.14 - 5.80 x10E6/uL   Hemoglobin 9.9 (L) 13.0 - 17.7 g/dL   Hematocrit 67.9 (L) 62.4 - 51.0 %   MCV 82 79 - 97 fL   MCH 25.4 (L) 26.6 - 33.0 pg   MCHC 30.9 (L) 31.5 - 35.7 g/dL   RDW 85.0 88.3 - 84.5 %   Platelets 307 150 - 450 x10E3/uL   Neutrophils 50 Not Estab. %   Lymphs 36 Not Estab. %   Monocytes 11 Not Estab. %   Eos 2 Not Estab. %   Basos 1 Not Estab. %   Neutrophils Absolute 3.4 1.4 - 7.0 x10E3/uL   Lymphocytes Absolute 2.5 0.7 - 3.1 x10E3/uL   Monocytes Absolute 0.7 0.1 - 0.9 x10E3/uL   EOS (ABSOLUTE) 0.1 0.0 - 0.4 x10E3/uL   Basophils Absolute 0.1 0.0 - 0.2 x10E3/uL   Immature Granulocytes 0 Not Estab. %   Immature Grans (Abs) 0.0 0.0 - 0.1 x10E3/uL  Hemoglobin A1c  Status: Abnormal   Collection Time: 02/19/24 12:07 PM  Result Value Ref Range   Hgb A1c MFr Bld 5.7 (H) 4.8 - 5.6 %    Comment:          Prediabetes: 5.7 - 6.4          Diabetes: >6.4          Glycemic control for adults with diabetes: <7.0    Est. average glucose Bld gHb Est-mCnc 117 mg/dL  CBC with Differential/Platelet     Status: Abnormal   Collection Time: 02/22/24  9:58 AM  Result Value Ref Range   WBC 6.4 3.4 - 10.8 x10E3/uL   RBC 3.93 (L) 4.14 - 5.80 x10E6/uL   Hemoglobin 10.0 (L) 13.0 - 17.7 g/dL   Hematocrit 66.6 (L) 62.4 - 51.0 %   MCV 85 79 - 97 fL   MCH 25.4 (L) 26.6 - 33.0 pg   MCHC 30.0 (L) 31.5 - 35.7 g/dL   RDW 85.5 88.3 - 84.5 %   Platelets 302 150 - 450 x10E3/uL   Neutrophils 46 Not Estab. %   Lymphs 34 Not Estab. %   Monocytes 16 Not Estab. %   Eos 3 Not Estab. %   Basos 1 Not Estab. %   Neutrophils Absolute 3.0 1.4 - 7.0 x10E3/uL   Lymphocytes  Absolute 2.2 0.7 - 3.1 x10E3/uL   Monocytes Absolute 1.0 (H) 0.1 - 0.9 x10E3/uL   EOS (ABSOLUTE) 0.2 0.0 - 0.4 x10E3/uL   Basophils Absolute 0.1 0.0 - 0.2 x10E3/uL   Immature Granulocytes 0 Not Estab. %   Immature Grans (Abs) 0.0 0.0 - 0.1 x10E3/uL  CBG monitoring, ED     Status: Abnormal   Collection Time: 03/27/24  6:09 PM  Result Value Ref Range   Glucose-Capillary 109 (H) 70 - 99 mg/dL    Comment: Glucose reference range applies only to samples taken after fasting for at least 8 hours.  Protime-INR     Status: None   Collection Time: 03/27/24  6:20 PM  Result Value Ref Range   Prothrombin Time 14.6 11.4 - 15.2 seconds   INR 1.1 0.8 - 1.2    Comment: (NOTE) INR goal varies based on device and disease states. Performed at [facility], [address] [place], [place] [address]   APTT     Status: None   Collection Time: 03/27/24  6:20 PM  Result Value Ref Range   aPTT 29 24 - 36 seconds    Comment: Performed at [facility], [address] [place], [place] [address]  CBC     Status: Abnormal   Collection Time: 03/27/24  6:20 PM  Result Value Ref Range   WBC 8.5 4.0 - 10.5 K/uL   RBC 3.65 (L) 4.22 - 5.81 MIL/uL   Hemoglobin 9.0 (L) 13.0 - 17.0 g/dL   HCT 70.2 (L) 60.9 - 47.9 %   MCV 81.4 80.0 - 100.0 fL   MCH 24.7 (L) 26.0 - 34.0 pg   MCHC 30.3 30.0 - 36.0 g/dL   RDW 84.0 (H) 88.4 - 84.4 %   Platelets 275 150 - 400 K/uL   nRBC 0.0 0.0 - 0.2 %    Comment: Performed at [facility], [address] [place], [place] [address]  Differential     Status: None   Collection Time: 03/27/24  6:20 PM  Result Value Ref Range   Neutrophils Relative % 61 %   Neutro Abs 5.2 1.7 - 7.7 K/uL  Lymphocytes Relative 27 %   Lymphs Abs 2.3 0.7 - 4.0 K/uL   Monocytes Relative 10 %   Monocytes Absolute 0.8 0.1 - 1.0 K/uL   Eosinophils Relative 1 %   Eosinophils Absolute 0.1 0.0 - 0.5 K/uL   Basophils Relative 1 %   Basophils Absolute 0.1 0.0 - 0.1 K/uL    Immature Granulocytes 0 %   Abs Immature Granulocytes 0.02 0.00 - 0.07 K/uL    Comment: Performed at [facility], [address] [place], [place] [address]  Comprehensive metabolic panel     Status: Abnormal   Collection Time: 03/27/24  6:20 PM  Result Value Ref Range   Sodium 135 135 - 145 mmol/L   Potassium 3.7 3.5 - 5.1 mmol/L   Chloride 100 98 - 111 mmol/L   CO2 24 22 - 32 mmol/L   Glucose, Bld 118 (H) 70 - 99 mg/dL    Comment: Glucose reference range applies only to samples taken after fasting for at least 8 hours.   BUN 16 8 - 23 mg/dL   Creatinine, Ser 8.51 (H) 0.61 - 1.24 mg/dL   Calcium  8.5 (L) 8.9 - 10.3 mg/dL   Total Protein 6.6 6.5 - 8.1 g/dL   Albumin 3.9 3.5 - 5.0 g/dL   AST 19 15 - 41 U/L   ALT 19 0 - 44 U/L   Alkaline Phosphatase 74 38 - 126 U/L   Total Bilirubin 0.5 0.0 - 1.2 mg/dL   GFR, Estimated 51 (L) >60 mL/min    Comment: (NOTE) Calculated using the CKD-EPI Creatinine Equation (2021)    Anion gap 11 5 - 15    Comment: Performed at [facility], [address] [place], [place] [address]  Iron  and TIBC     Status: Abnormal   Collection Time: 03/27/24  6:20 PM  Result Value Ref Range   Iron  25 (L) 45 - 182 ug/dL   TIBC 613 749 - 549 ug/dL   Saturation Ratios 6 (L) 17.9 - 39.5 %   UIBC 362 ug/dL    Comment: Performed at [facility], [address] [place], [place] [address]  Ferritin     Status: Abnormal   Collection Time: 03/27/24  6:20 PM  Result Value Ref Range   Ferritin 17 (L) 24 - 336 ng/mL    Comment: Performed at [facility], [address] [place], [place] [address]  Urine Drug Screen     Status: None   Collection Time: 03/28/24  4:10 AM  Result Value Ref Range   Opiates NEGATIVE NEGATIVE   Cocaine NEGATIVE NEGATIVE   Benzodiazepines NEGATIVE NEGATIVE   Amphetamines NEGATIVE NEGATIVE   Tetrahydrocannabinol NEGATIVE NEGATIVE   Barbiturates NEGATIVE NEGATIVE   Methadone Scn, Ur NEGATIVE NEGATIVE    Fentanyl  NEGATIVE NEGATIVE    Comment: (NOTE) Drug screen is for Medical Purposes only. Positive results are preliminary only. If confirmation is needed, notify lab within 5 days.  Drug Class                 Cutoff (ng/mL) Amphetamine and metabolites 1000 Barbiturate and metabolites 200 Benzodiazepine              200 Opiates and metabolites     300 Cocaine and metabolites     300 THC                         50 Fentanyl   5 Methadone                   300  Trazodone is metabolized in vivo to several metabolites,  including pharmacologically active m-CPP, which is excreted in the  urine.  Immunoassay screens for amphetamines and MDMA have potential  cross-reactivity with these compounds and may provide false positive  result.  Performed at [facility], [address] [place], [place] [address]   Vitamin B12     Status: Abnormal   Collection Time: 03/28/24  2:27 PM  Result Value Ref Range   Vitamin B-12 968 (H) 180 - 914 pg/mL    Comment: Performed at [facility], [address], [place], [place] [address]      Assessment & Plan:  Continue current medications.  Problem List Items Addressed This Visit       Cardiovascular and Mediastinum   Ischemic stroke (HCC) - Primary   Primary hypertension     Genitourinary   Chronic renal failure, stage 3a (HCC)     Other   HLD (hyperlipidemia)   Obesity (BMI 30-39.9)   Iron  deficiency anemia    Return if symptoms worsen or fail to improve, for as scheduled.   Total time spent: 25 minutes. This time includes review of previous notes and results and patient face to face interaction during today's visit.    Jeoffrey Pollen, NP  04/18/2024   This document may have been prepared by [facility] Voice Recognition software and as such may include unintentional dictation errors.     [1] No Known Allergies [2]  Outpatient Medications Prior to Visit  Medication Sig   potassium chloride   (KLOR-CON  M) 10 MEQ tablet Take 10 mEq by mouth daily.   amLODipine  (NORVASC ) 5 MG tablet TAKE 1 TABLET(5 MG) BY MOUTH DAILY   aspirin  EC 81 MG tablet Take 1 tablet (81 mg total) by mouth daily. Swallow whole.   atorvastatin  (LIPITOR) 80 MG tablet Take 1 tablet (80 mg total) by mouth daily.   clopidogrel  (PLAVIX ) 75 MG tablet Take 1 tablet (75 mg total) by mouth daily.   ezetimibe  (ZETIA ) 10 MG tablet Take 1 tablet (10 mg total) by mouth daily.   ferrous sulfate  325 (65 FE) MG tablet Take 1 tablet (325 mg total) by mouth daily.   labetalol  (NORMODYNE ) 300 MG tablet TAKE 1 TABLET(300 MG) BY MOUTH TWICE DAILY   pantoprazole  (PROTONIX ) 40 MG tablet Take 1 tablet (40 mg total) by mouth daily.   senna-docusate (SENOKOT-S) 8.6-50 MG tablet Take 2 tablets by mouth 2 (two) times daily.   [DISCONTINUED] sodium chloride  flush (NS) 0.9 % injection 3 mL    No facility-administered medications prior to visit.   "

## 2024-04-19 ENCOUNTER — Other Ambulatory Visit: Payer: Self-pay

## 2024-04-19 NOTE — Transitions of Care (Post Inpatient/ED Visit) (Signed)
" °  Transition of Care week 4  Visit Note  04/19/2024  Name: Travis Williams MRN: [id number]          DOB: October 29, 1955  Situation: Patient enrolled in [facility] 30-day program. Visit completed with Dickson Miralles by telephone.   Background:   Initial Transition Care Management Follow-up Telephone Call Discharge Date and Diagnosis: 03/30/24, CVA   Past Medical History:  Diagnosis Date   Hypertension    Stroke [facility])     Assessment: Patient Reported Symptoms: Cognitive Cognitive Status: Alert and oriented to person, place, and time, Normal speech and language skills      Neurological Neurological Review of Symptoms: No symptoms reported    HEENT HEENT Symptoms Reported: No symptoms reported      Cardiovascular Cardiovascular Symptoms Reported: No symptoms reported Does patient have uncontrolled Hypertension?: No Cardiovascular Management Strategies: Medication therapy, Coping strategies  Respiratory Respiratory Symptoms Reported: No symptoms reported Respiratory Management Strategies: BiPAP, Coping strategies, Medication therapy, Routine screening  Endocrine Endocrine Symptoms Reported: No symptoms reported Is patient diabetic?: No    Gastrointestinal Gastrointestinal Symptoms Reported: No symptoms reported      Genitourinary Genitourinary Symptoms Reported: No symptoms reported    Integumentary Integumentary Symptoms Reported: No symptoms reported    Musculoskeletal Musculoskelatal Symptoms Reviewed: No symptoms reported Musculoskeletal Management Strategies: Activity, Routine screening      Psychosocial Psychosocial Symptoms Reported: No symptoms reported         There were no vitals filed for this visit.    Medications Reviewed Today     Reviewed by Moises Reusing, RN (Case Manager) on 04/19/24 at 1122  Med List Status: <None>   Medication Order Taking? Sig Documenting Provider Last Dose Status Informant  amLODipine  (NORVASC ) 5 MG tablet [id number] No TAKE 1 TABLET(5  MG) BY MOUTH DAILY Fernand Alter A, MD 03/27/2024 Morning Active Self  aspirin  EC 81 MG tablet [id number]  Take 1 tablet (81 mg total) by mouth daily. Swallow whole. Laurita Pillion, MD  Active   atorvastatin  (LIPITOR) 80 MG tablet [id number] No Take 1 tablet (80 mg total) by mouth daily. Fernand Fredy RAMAN, MD 03/27/2024 Morning Active Self  clopidogrel  (PLAVIX ) 75 MG tablet [id number] No Take 1 tablet (75 mg total) by mouth daily. Sethi, Pramod S, MD 03/27/2024 Morning Active Self  ezetimibe  (ZETIA ) 10 MG tablet [id number] No Take 1 tablet (10 mg total) by mouth daily. Sethi, Pramod S, MD 03/27/2024 Morning Active Self  ferrous sulfate  325 (65 FE) MG tablet [id number]  Take 1 tablet (325 mg total) by mouth daily. Laurita Pillion, MD  Active   labetalol  (NORMODYNE ) 300 MG tablet [id number] No TAKE 1 TABLET(300 MG) BY MOUTH TWICE DAILY Fernand Fredy RAMAN, MD 03/27/2024 Morning Active Self  pantoprazole  (PROTONIX ) 40 MG tablet [id number] No Take 1 tablet (40 mg total) by mouth daily. Fernand Fredy RAMAN, MD 03/27/2024 Morning Active Self  potassium chloride  (KLOR-CON  M) 10 MEQ tablet [id number]  Take 10 mEq by mouth daily. [provider]  Active   senna-docusate (SENOKOT-S) 8.6-50 MG tablet [id number]  Take 2 tablets by mouth 2 (two) times daily. Laurita Pillion, MD  Active             Recommendation:   Continue Current Plan of Care  Follow Up Plan:   Telephone follow-up in 1 week  Medford Moises, BSN, RN [place]  VBCI - [facility] RN Care Manager [phone]     "

## 2024-04-25 ENCOUNTER — Other Ambulatory Visit: Payer: Self-pay | Admitting: Internal Medicine

## 2024-04-25 DIAGNOSIS — K219 Gastro-esophageal reflux disease without esophagitis: Secondary | ICD-10-CM

## 2024-04-26 ENCOUNTER — Other Ambulatory Visit: Payer: Self-pay

## 2024-04-26 NOTE — Transitions of Care (Post Inpatient/ED Visit) (Signed)
" °  Transition of Care Week 5  Visit Note  04/26/2024  Name: Travis Williams MRN: [id number]          DOB: October 06, 1955  Situation: Patient enrolled in [facility] 30-day program. Visit completed with Demetri Mcwherter by telephone.   Background:   Initial Transition Care Management Follow-up Telephone Call Discharge Date and Diagnosis: 03/30/24, CVA   Past Medical History:  Diagnosis Date   Hypertension    Stroke [facility])     Assessment: Patient Reported Symptoms: Cognitive Cognitive Status: Alert and oriented to person, place, and time, Normal speech and language skills      Neurological Neurological Review of Symptoms: No symptoms reported    HEENT HEENT Symptoms Reported: No symptoms reported      Cardiovascular Cardiovascular Symptoms Reported: No symptoms reported Cardiovascular Management Strategies: Medication therapy, Coping strategies  Respiratory Respiratory Symptoms Reported: No symptoms reported Respiratory Management Strategies: BiPAP, Medication therapy, Routine screening, Coping strategies, Activity  Endocrine Endocrine Symptoms Reported: No symptoms reported Is patient diabetic?: No    Gastrointestinal Gastrointestinal Symptoms Reported: No symptoms reported      Genitourinary Genitourinary Symptoms Reported: No symptoms reported    Integumentary Integumentary Symptoms Reported: No symptoms reported    Musculoskeletal Musculoskelatal Symptoms Reviewed: No symptoms reported        Psychosocial Psychosocial Symptoms Reported: No symptoms reported         There were no vitals filed for this visit.    Medications Reviewed Today     Reviewed by Moises Reusing, RN (Case Manager) on 04/26/24 at 1102  Med List Status: <None>   Medication Order Taking? Sig Documenting Provider Last Dose Status Informant  amLODipine  (NORVASC ) 5 MG tablet [id number] No TAKE 1 TABLET(5 MG) BY MOUTH DAILY Fernand Alter A, MD 03/27/2024 Morning Active Self  aspirin  EC 81 MG tablet [id number]   Take 1 tablet (81 mg total) by mouth daily. Swallow whole. Laurita Pillion, MD  Active   atorvastatin  (LIPITOR) 80 MG tablet [id number] No Take 1 tablet (80 mg total) by mouth daily. Fernand Fredy RAMAN, MD 03/27/2024 Morning Active Self  clopidogrel  (PLAVIX ) 75 MG tablet [id number] No Take 1 tablet (75 mg total) by mouth daily. Sethi, Pramod S, MD 03/27/2024 Morning Active Self  ezetimibe  (ZETIA ) 10 MG tablet [id number] No Take 1 tablet (10 mg total) by mouth daily. Sethi, Pramod S, MD 03/27/2024 Morning Active Self  ferrous sulfate  325 (65 FE) MG tablet [id number]  Take 1 tablet (325 mg total) by mouth daily. Laurita Pillion, MD  Active   labetalol  (NORMODYNE ) 300 MG tablet [id number] No TAKE 1 TABLET(300 MG) BY MOUTH TWICE DAILY Fernand Fredy RAMAN, MD 03/27/2024 Morning Active Self  pantoprazole  (PROTONIX ) 40 MG tablet [id number]  TAKE 1 TABLET(40 MG) BY MOUTH DAILY Fernand Fredy RAMAN, MD  Active   potassium chloride  (KLOR-CON  M) 10 MEQ tablet [id number]  Take 10 mEq by mouth daily. [provider]  Active   senna-docusate (SENOKOT-S) 8.6-50 MG tablet [id number]  Take 2 tablets by mouth 2 (two) times daily. Laurita Pillion, MD  Active             Recommendation:   Discharge from the [facility] program. Goals met  Follow Up Plan:   Closing From:  Transitions of Care Program  [facility], BSN, RN [place]  VBCI - [facility] RN Care Manager [phone]     "

## 2024-05-23 ENCOUNTER — Ambulatory Visit: Admitting: Cardiovascular Disease

## 2024-07-02 ENCOUNTER — Ambulatory Visit: Admitting: Internal Medicine

## 2024-07-11 ENCOUNTER — Ambulatory Visit (INDEPENDENT_AMBULATORY_CARE_PROVIDER_SITE_OTHER): Admitting: Nurse Practitioner

## 2024-11-06 ENCOUNTER — Ambulatory Visit: Admitting: Neurology
